# Patient Record
Sex: Male | Born: 1955 | Race: Black or African American | Hispanic: No | Smoking: Never smoker
Health system: Southern US, Community
[De-identification: ages and names within clinical notes are randomized; demographics above are authoritative.]

## PROBLEM LIST (undated history)

## (undated) DIAGNOSIS — E669 Obesity, unspecified: Secondary | ICD-10-CM

## (undated) HISTORY — PX: HERNIA REPAIR: SHX51

---

## 2005-12-17 DIAGNOSIS — Z9889 Other specified postprocedural states: Secondary | ICD-10-CM | POA: Insufficient documentation

## 2015-04-07 DIAGNOSIS — I110 Hypertensive heart disease with heart failure: Secondary | ICD-10-CM | POA: Diagnosis present

## 2021-06-23 ENCOUNTER — Inpatient Hospital Stay (HOSPITAL_BASED_OUTPATIENT_CLINIC_OR_DEPARTMENT_OTHER)
Admission: EM | Admit: 2021-06-23 | Discharge: 2021-07-19 | DRG: 329 | Disposition: A | Discharge status: Skilled Nursing Facility | Payer: Medicare Other | Attending: Internal Medicine | Admitting: Internal Medicine

## 2021-06-23 ENCOUNTER — Emergency Department (HOSPITAL_BASED_OUTPATIENT_CLINIC_OR_DEPARTMENT_OTHER): Payer: Medicare Other

## 2021-06-23 ENCOUNTER — Encounter (HOSPITAL_BASED_OUTPATIENT_CLINIC_OR_DEPARTMENT_OTHER): Payer: Self-pay | Admitting: *Deleted

## 2021-06-23 ENCOUNTER — Other Ambulatory Visit: Payer: Self-pay

## 2021-06-23 DIAGNOSIS — E876 Hypokalemia: Secondary | ICD-10-CM

## 2021-06-23 DIAGNOSIS — Z933 Colostomy status: Secondary | ICD-10-CM

## 2021-06-23 DIAGNOSIS — Z6841 Body Mass Index (BMI) 40.0 and over, adult: Secondary | ICD-10-CM

## 2021-06-23 DIAGNOSIS — U071 COVID-19: Secondary | ICD-10-CM

## 2021-06-23 DIAGNOSIS — R112 Nausea with vomiting, unspecified: Secondary | ICD-10-CM | POA: Diagnosis present

## 2021-06-23 DIAGNOSIS — N170 Acute kidney failure with tubular necrosis: Secondary | ICD-10-CM | POA: Diagnosis present

## 2021-06-23 DIAGNOSIS — Y836 Removal of other organ (partial) (total) as the cause of abnormal reaction of the patient, or of later complication, without mention of misadventure at the time of the procedure: Secondary | ICD-10-CM | POA: Diagnosis not present

## 2021-06-23 DIAGNOSIS — J9601 Acute respiratory failure with hypoxia: Secondary | ICD-10-CM | POA: Diagnosis not present

## 2021-06-23 DIAGNOSIS — I509 Heart failure, unspecified: Secondary | ICD-10-CM | POA: Diagnosis present

## 2021-06-23 DIAGNOSIS — N400 Enlarged prostate without lower urinary tract symptoms: Secondary | ICD-10-CM | POA: Diagnosis present

## 2021-06-23 DIAGNOSIS — K529 Noninfective gastroenteritis and colitis, unspecified: Secondary | ICD-10-CM | POA: Diagnosis present

## 2021-06-23 DIAGNOSIS — I110 Hypertensive heart disease with heart failure: Secondary | ICD-10-CM | POA: Diagnosis present

## 2021-06-23 DIAGNOSIS — E46 Unspecified protein-calorie malnutrition: Secondary | ICD-10-CM | POA: Diagnosis present

## 2021-06-23 DIAGNOSIS — K567 Ileus, unspecified: Secondary | ICD-10-CM | POA: Diagnosis not present

## 2021-06-23 DIAGNOSIS — R571 Hypovolemic shock: Secondary | ICD-10-CM | POA: Diagnosis not present

## 2021-06-23 DIAGNOSIS — K6389 Other specified diseases of intestine: Secondary | ICD-10-CM | POA: Diagnosis present

## 2021-06-23 DIAGNOSIS — K56609 Unspecified intestinal obstruction, unspecified as to partial versus complete obstruction: Secondary | ICD-10-CM | POA: Diagnosis not present

## 2021-06-23 DIAGNOSIS — K5720 Diverticulitis of large intestine with perforation and abscess without bleeding: Principal | ICD-10-CM | POA: Diagnosis present

## 2021-06-23 DIAGNOSIS — T8132XA Disruption of internal operation (surgical) wound, not elsewhere classified, initial encounter: Secondary | ICD-10-CM | POA: Diagnosis not present

## 2021-06-23 DIAGNOSIS — K56601 Complete intestinal obstruction, unspecified as to cause: Secondary | ICD-10-CM

## 2021-06-23 DIAGNOSIS — R739 Hyperglycemia, unspecified: Secondary | ICD-10-CM | POA: Diagnosis present

## 2021-06-23 DIAGNOSIS — Z22322 Carrier or suspected carrier of Methicillin resistant Staphylococcus aureus: Secondary | ICD-10-CM

## 2021-06-23 DIAGNOSIS — R0902 Hypoxemia: Secondary | ICD-10-CM

## 2021-06-23 DIAGNOSIS — Z79899 Other long term (current) drug therapy: Secondary | ICD-10-CM | POA: Diagnosis not present

## 2021-06-23 DIAGNOSIS — E65 Localized adiposity: Secondary | ICD-10-CM

## 2021-06-23 DIAGNOSIS — D62 Acute posthemorrhagic anemia: Secondary | ICD-10-CM | POA: Diagnosis not present

## 2021-06-23 HISTORY — DX: Obesity, unspecified: E66.9

## 2021-06-23 LAB — CBC
HCT: 48.2 % (ref 39.0–52.0)
Hemoglobin: 16.7 g/dL (ref 13.0–17.0)
MCH: 30.4 pg (ref 26.0–34.0)
MCHC: 34.6 g/dL (ref 30.0–36.0)
MCV: 87.6 fL (ref 80.0–100.0)
Platelets: 277 10*3/uL (ref 150–400)
RBC: 5.5 MIL/uL (ref 4.22–5.81)
RDW: 13.6 % (ref 11.5–15.5)
WBC: 12.4 10*3/uL — ABNORMAL HIGH (ref 4.0–10.5)
nRBC: 0 % (ref 0.0–0.2)

## 2021-06-23 LAB — COMPREHENSIVE METABOLIC PANEL
ALT: 10 U/L (ref 0–44)
AST: 18 U/L (ref 15–41)
Albumin: 3.6 g/dL (ref 3.5–5.0)
Alkaline Phosphatase: 86 U/L (ref 38–126)
Anion gap: 8 (ref 5–15)
BUN: 13 mg/dL (ref 8–23)
CO2: 25 mmol/L (ref 22–32)
Calcium: 9 mg/dL (ref 8.9–10.3)
Chloride: 105 mmol/L (ref 98–111)
Creatinine, Ser: 0.94 mg/dL (ref 0.61–1.24)
GFR, Estimated: >60 mL/min (ref >60)
Glucose, Bld: 115 mg/dL — ABNORMAL HIGH (ref 70–99)
Potassium: 3.4 mmol/L — ABNORMAL LOW (ref 3.5–5.1)
Sodium: 138 mmol/L (ref 135–145)
Total Bilirubin: 0.8 mg/dL (ref 0.3–1.2)
Total Protein: 8.9 g/dL — ABNORMAL HIGH (ref 6.5–8.1)

## 2021-06-23 LAB — URINALYSIS, MICROSCOPIC (REFLEX)

## 2021-06-23 LAB — URINALYSIS, ROUTINE W REFLEX MICROSCOPIC
Glucose, UA: NEGATIVE mg/dL
Ketones, ur: NEGATIVE mg/dL
Nitrite: NEGATIVE
Protein, ur: 30 mg/dL — AB
Specific Gravity, Urine: 1.02 (ref 1.005–1.030)
pH: 5.5 (ref 5.0–8.0)

## 2021-06-23 LAB — BRAIN NATRIURETIC PEPTIDE: B Natriuretic Peptide: 25.5 pg/mL (ref 0.0–100.0)

## 2021-06-23 LAB — RESP PANEL BY RT-PCR (FLU A&B, COVID) ARPGX2
Influenza A by PCR: NEGATIVE
Influenza B by PCR: NEGATIVE
SARS Coronavirus 2 by RT PCR: POSITIVE — AB

## 2021-06-23 LAB — LIPASE, BLOOD: Lipase: 22 U/L (ref 11–51)

## 2021-06-23 MED ORDER — ONDANSETRON HCL 4 MG/2ML IJ SOLN: 4.0000 mg | Freq: Once | INTRAMUSCULAR | Status: AC

## 2021-06-23 MED ORDER — ONDANSETRON HCL 4 MG/2ML IJ SOLN
4.0000 mg | Freq: Four times a day (QID) | INTRAMUSCULAR | Status: DC | PRN
  Administered 2021-06-26 – 2021-06-27 (×2): 4 mg via INTRAVENOUS
  Filled 2021-06-23 (×2): qty 2

## 2021-06-23 MED ORDER — POTASSIUM CHLORIDE 20 MEQ PO PACK
20.0000 meq | PACK | Freq: Once | ORAL | Status: AC
  Administered 2021-06-23: 20 meq via ORAL
  Filled 2021-06-23: qty 1

## 2021-06-23 MED ORDER — ACETAMINOPHEN 325 MG PO TABS: 650.0000 mg | ORAL_TABLET | Freq: Four times a day (QID) | ORAL | Status: DC | PRN

## 2021-06-23 MED ORDER — ONDANSETRON HCL 4 MG/2ML IJ SOLN
INTRAMUSCULAR | Status: AC
  Administered 2021-06-23: 4 mg via INTRAVENOUS
  Filled 2021-06-23: qty 2

## 2021-06-23 MED ORDER — MORPHINE SULFATE (PF) 2 MG/ML IV SOLN
2.0000 mg | INTRAVENOUS | Status: DC | PRN
  Administered 2021-06-23 – 2021-06-24 (×4): 2 mg via INTRAVENOUS
  Filled 2021-06-23 (×5): qty 1

## 2021-06-23 MED ORDER — OXYCODONE HCL 5 MG PO TABS: 5.0000 mg | ORAL_TABLET | ORAL | Status: DC | PRN

## 2021-06-23 MED ORDER — MUPIROCIN 2 % EX OINT
1.0000 "application " | TOPICAL_OINTMENT | Freq: Two times a day (BID) | CUTANEOUS | Status: AC
  Administered 2021-06-24 – 2021-06-28 (×10): 1 via NASAL
  Filled 2021-06-23 (×4): qty 22

## 2021-06-23 MED ORDER — MORPHINE SULFATE (PF) 4 MG/ML IV SOLN
4.0000 mg | Freq: Once | INTRAVENOUS | Status: AC
Start: 2021-06-23 — End: 2021-06-23
  Administered 2021-06-23: 4 mg via INTRAVENOUS
  Filled 2021-06-23: qty 1

## 2021-06-23 MED ORDER — ACETAMINOPHEN 650 MG RE SUPP: 650.0000 mg | Freq: Four times a day (QID) | RECTAL | Status: DC | PRN

## 2021-06-23 MED ORDER — ENOXAPARIN SODIUM 40 MG/0.4ML IJ SOSY: 40.0000 mg | PREFILLED_SYRINGE | INTRAMUSCULAR | Status: DC

## 2021-06-23 MED ORDER — IOHEXOL 300 MG/ML  SOLN
100.0000 mL | Freq: Once | INTRAMUSCULAR | Status: AC | PRN
  Administered 2021-06-23: 100 mL via INTRAVENOUS

## 2021-06-23 MED ORDER — ONDANSETRON HCL 4 MG PO TABS
4.0000 mg | ORAL_TABLET | Freq: Four times a day (QID) | ORAL | Status: DC | PRN
Start: 2021-06-23 — End: 2021-07-20

## 2021-06-23 MED ORDER — LACTATED RINGERS IV SOLN: INTRAVENOUS | Status: DC

## 2021-06-23 MED ORDER — ALBUTEROL SULFATE (2.5 MG/3ML) 0.083% IN NEBU: 2.5000 mg | INHALATION_SOLUTION | Freq: Four times a day (QID) | RESPIRATORY_TRACT | Status: DC | PRN

## 2021-06-23 NOTE — ED Notes (Signed)
Report called to [facility], CN at [facility]

## 2021-06-23 NOTE — ED Triage Notes (Signed)
Abdominal pain. Abdominal bloating. Constipation. Hx of diverticulitis.

## 2021-06-23 NOTE — ED Provider Notes (Addendum)
[facility] EMERGENCY DEPARTMENT Provider Note   CSN: [id number] Arrival date & time: 06/23/21  1045     History Chief Complaint  Patient presents with   Abdominal Pain    Travis Calderon is a 65 y.o. male with a past medical history significant for diverticulosis with a history of diverticulitis who presents to the ED due to generalized abdominal pain x3 days associated with abdominal distention and nausea.  Patient admits to mild nausea, but denies vomiting and diarrhea.  He has had a previous hernia repair however, no other abdominal operations.  Admits to intermittent chills, but denies fever.  Denies chest pain and shortness of breath.  No treatment prior to arrival.  No aggravating or alleviating symptoms.  History obtained from patient and past medical records. No interpreter used during encounter.       Past Medical History:  Diagnosis Date   Obesity     There are no problems to display for this patient.   Past Surgical History:  Procedure Laterality Date   HERNIA REPAIR         No family history on file.  Social History   Tobacco Use   Smoking status: Never   Smokeless tobacco: Never  Vaping Use   Vaping Use: Never used  Substance Use Topics   Alcohol use: Yes   Drug use: Never    Home Medications Prior to Admission medications   Not on File    Allergies    Patient has no known allergies.  Review of Systems   Review of Systems  Constitutional:  Positive for chills. Negative for fever.  Respiratory:  Negative for shortness of breath.   Cardiovascular:  Negative for chest pain.  Gastrointestinal:  Positive for abdominal distention, abdominal pain and nausea. Negative for diarrhea and vomiting.  Genitourinary:  Negative for dysuria.  All other systems reviewed and are negative.  Physical Exam Updated Vital Signs BP 125/80   Pulse 91   Temp 98.5 F (36.9 C) (Oral)   Resp 14   Ht 5\' 7"  (1.702 m)   Wt (!) 145.2 kg   SpO2 95%    BMI 50.15 kg/m   Physical Exam Vitals and nursing note reviewed.  Constitutional:      General: He is not in acute distress.    Appearance: He is not ill-appearing.  HENT:     Head: Normocephalic.  Eyes:     Pupils: Pupils are equal, round, and reactive to light.  Cardiovascular:     Rate and Rhythm: Normal rate and regular rhythm.     Pulses: Normal pulses.     Heart sounds: Normal heart sounds. No murmur heard.   No friction rub. No gallop.  Pulmonary:     Effort: Pulmonary effort is normal.     Breath sounds: Normal breath sounds.  Abdominal:     General: Abdomen is flat. There is distension.     Palpations: Abdomen is soft.     Tenderness: There is abdominal tenderness. There is guarding. There is no rebound.     Comments: Diffuse abdominal tenderness with voluntary guarding.  Musculoskeletal:        General: Normal range of motion.     Cervical back: Neck supple.     Comments: 1+ pitting edema bilaterally  Skin:    General: Skin is warm and dry.  Neurological:     General: No focal deficit present.     Mental Status: He is alert.  Psychiatric:  Mood and Affect: Mood normal.        Behavior: Behavior normal.    ED Results / Procedures / Treatments   Labs (all labs ordered are listed, but only abnormal results are displayed) Labs Reviewed  COMPREHENSIVE METABOLIC PANEL - Abnormal; Notable for the following components:      Result Value   Potassium 3.4 (*)    Glucose, Bld 115 (*)    Total Protein 8.9 (*)    All other components within normal limits  CBC - Abnormal; Notable for the following components:   WBC 12.4 (*)    All other components within normal limits  URINALYSIS, ROUTINE W REFLEX MICROSCOPIC - Abnormal; Notable for the following components:   Color, Urine AMBER (*)    Hgb urine dipstick LARGE (*)    Bilirubin Urine MODERATE (*)    Protein, ur 30 (*)    Leukocytes,Ua TRACE (*)    All other components within normal limits  URINALYSIS,  MICROSCOPIC (REFLEX) - Abnormal; Notable for the following components:   Bacteria, UA FEW (*)    All other components within normal limits  RESP PANEL BY RT-PCR (FLU A&B, COVID) ARPGX2  LIPASE, BLOOD  BRAIN NATRIURETIC PEPTIDE    EKG None  Radiology CT ABDOMEN PELVIS W CONTRAST  Result Date: 06/23/2021 CLINICAL DATA:  Abdominal distension, constipation EXAM: CT ABDOMEN AND PELVIS WITH CONTRAST TECHNIQUE: Multidetector CT imaging of the abdomen and pelvis was performed using the standard protocol following bolus administration of intravenous contrast. CONTRAST:  OMNIPAQUE IOHEXOL 300 MG/ML  SOLN COMPARISON:  None. FINDINGS: Lower chest: The included lung bases are clear. Heart size within normal limits. Hepatobiliary: No focal liver abnormality is seen. No gallstones, gallbladder wall thickening, or biliary dilatation. Pancreas: Unremarkable. No pancreatic ductal dilatation or surrounding inflammatory changes. Spleen: Normal in size without focal abnormality. Adrenals/Urinary Tract: Unremarkable adrenal glands. No adrenal nodules or masses. Simple 1.6 cm lower pole right renal cyst. Simple 1.3 cm cyst within the interpolar region of the left kidney. No renal stone or hydronephrosis. Urinary bladder is incompletely distended, limiting its evaluation. Stomach/Bowel: Large mass within the proximal sigmoid colon with approximate measurements of 8.6 x 3.8 x 3.6 cm (series 2, images 65-77). Multiple small adjacent pericolonic lymph nodes concerning for metastatic disease. Mass completely occupies the colonic lumen at this location. There is associated obstruction of large and small bowel upstream from this level. Fecalization of small bowel content. No pneumatosis is seen. An air-filled appendix is present. Stomach is mildly distended but otherwise within normal limits. Vascular/Lymphatic: No vascular abnormality is identified. Scattered nonenlarged retroperitoneal lymph nodes are nonspecific.  Reproductive: Mildly enlarged prostate gland. Other: No free fluid. No abdominopelvic fluid collection. No pneumoperitoneum. No abdominal wall hernia. Musculoskeletal: No acute osseous abnormality. No suspicious bone lesion. Severe osteoarthritis of the right hip. IMPRESSION: 1. Large obstructing colonic mass within the proximal sigmoid colon measuring up to 8.6 cm. There is associated mechanical obstruction of the large and small bowel upstream from this mass. Surgical evaluation is recommended. 2. Multiple mildly prominent pericolonic lymph nodes concerning for metastatic disease. Scattered nonenlarged retroperitoneal lymph nodes are nonspecific. 3. Mildly enlarged prostate gland. These results were called by telephone at the time of interpretation on 06/23/2021 at 1:04 pm to provider [facility], [facility] , who verbally acknowledged these results. Electronically Signed   By: Duanne Guess D.O.   On: 06/23/2021 13:09   DG Chest Portable 1 View  Result Date: 06/23/2021 CLINICAL DATA:  Edema. EXAM: PORTABLE  CHEST 1 VIEW COMPARISON:  April 07, 2016. FINDINGS: Stable cardiomegaly. Both lungs are clear. The visualized skeletal structures are unremarkable. IMPRESSION: No active disease. Electronically Signed   By: Lupita Raider M.D.   On: 06/23/2021 12:55    Procedures Procedures   Medications Ordered in ED Medications  morphine 4 MG/ML injection 4 mg (4 mg Intravenous Given 06/23/21 1151)  ondansetron (ZOFRAN) injection 4 mg (4 mg Intravenous Given 06/23/21 1150)  iohexol (OMNIPAQUE) 300 MG/ML solution 100 mL (100 mLs Intravenous Contrast Given 06/23/21 1221)    ED Course  I have reviewed the triage vital signs and the nursing notes.  Pertinent labs & imaging results that were available during my care of the patient were reviewed by me and considered in my medical decision making (see chart for details).  Clinical Course as of 06/23/21 1524  Fri Jun 23, 2021  1139 WBC(!): 12.4 [CA]  1233 WBC(!): 12.4  [CA]  1234 Hgb urine dipstick(!): LARGE [CA]  1234 Bilirubin Urine(!): MODERATE [CA]  1234 Leukocytes,Ua(!): TRACE [CA]  1234 Bacteria, UA(!): FEW [CA]  1234 Potassium(!): 3.4 [CA]  1234 Glucose(!): 115 [CA]  1357 This is a 65 year old male with a history of obesity presenting to ED with abdominal pain, nausea and vomiting.  His vital signs were stable.  On CT he was found to have a large obstructing bowel mass with subsequent high-grade large and small intestine bowel obstruction.  No evidence of perforation.  The case was discussed with the general surgeon at [facility] who advised transfer of patient to [facility] ED for evaluation and determination of disposition plan.  The patient and his stepdaughter by phone are both notified of this plan and in agreement.  On reassessment he appears calm, does not have persistent nausea or vomiting.  He has very minimal abdominal pain. [MT]  1404 SARS Coronavirus 2 by RT PCR(!): POSITIVE [CA]    Clinical Course User Index [CA] Travis Stabile, Travis Calderon [MT] Terald Sleeper, MD   MDM Rules/Calculators/A&P                         65 year old male presents to the ED due to generalized abdominal pain associated with distention and nausea.  History of diverticulitis.  No fever.  He has had a previous hernia repair.  Upon arrival, stable vitals.  Patient noted to be tachycardic at triage at 105 however, during my initial evaluation, patient's heart rate in the 90s.  Patient nontoxic-appearing.  Physical exam significant for mild abdominal distention with diffuse tenderness.  1+ pitting edema bilaterally.  Abdominal labs ordered at triage.  Added BNP given bilateral 1+ edema to rule out CHF. CT abdomen to rule out diverticulitis and other emergent intra-abdominal etiologies. Zofran and morphine given.   CBC significant for mild leukocytosis at 12.4.  CMP significant for hypokalemia 3.4, hyperglycemia 115.  No anion gap.  Low suspicion for DKA.  Normal renal function.  Normal  LFTs.  UA significant for large hematuria, moderate bilirubinuria, and trace leukocytes. No urinary symptoms. Urine culture pending. CT abdomen personally reviewed which demonstrates: IMPRESSION:  1. Large obstructing colonic mass within the proximal sigmoid colon  measuring up to 8.6 cm. There is associated mechanical obstruction  of the large and small bowel upstream from this mass. Surgical  evaluation is recommended.  2. Multiple mildly prominent pericolonic lymph nodes concerning for  metastatic disease. Scattered nonenlarged retroperitoneal lymph  nodes are nonspecific.  3. Mildly enlarged prostate gland.  CXR personally reviewed which is negative for acute abnormalities.  1:17 PM Discussed case with Dr. Gerrit Friends with general surgery who recommends ED to ED transfer to [facility]. Dr. Gerrit Friends will evaluate patient and decide if surgery vs. Hospitalist admission.   Hyman Hopes, Travis Calderon (Dr. Dalene Seltzer, attending) accepted patient for transfer to [facility] ED. Upon arrival to ED, re-consult Dr. Gerrit Friends who will evaluate patient.   Discussed case with Dr. Renaye Rakers who evaluated patient at bedside and agrees with assessment and plan.   3:17 PM Patient found to be COVID positive.  Travis Calderon was evaluated in Emergency Department on 06/23/2021 for the symptoms described in the history of present illness. He was evaluated in the context of the global COVID-19 pandemic, which necessitated consideration that the patient might be at risk for infection with the SARS-CoV-2 virus that causes COVID-19. Institutional protocols and algorithms that pertain to the evaluation of patients at risk for COVID-19 are in a state of rapid change based on information released by regulatory bodies including the CDC and federal and state organizations. These policies and algorithms were followed during the patient's care in the ED.  Final Clinical Impression(s) / ED Diagnoses Final diagnoses:  Colonic mass    Rx / DC Orders ED  Discharge Orders     None        Travis Stabile, Travis Calderon 06/23/21 1331    Travis Calderon, [place] 06/23/21 1524    Terald Sleeper, MD 06/23/21 1724

## 2021-06-23 NOTE — ED Notes (Signed)
Report given to [facility] with [facility]

## 2021-06-23 NOTE — ED Notes (Signed)
Hospitalist at bedside 

## 2021-06-23 NOTE — ED Provider Notes (Signed)
Patient transferred from [facility] for general surgery evaluation.  See note for full HPI.  In summation 65 year old who originally presented for abdominal pain, nausea and vomiting.  CT scan was found to have large obstructing bowel mass with subsequent high-grade large bowel and small intestinal obstruction.  He was incidentally found to be COVID-positive.  Patient was transferred here for general surgery assessment.  Patient denies any current complaints.  Last bowel movement was yesterday.  No emesis during transport.  He is not passing flatus. Physical Exam  BP 120/73   Pulse 98   Temp 98.5 F (36.9 C) (Oral)   Resp 12   Ht 5\' 7"  (1.702 m)   Wt (!) 145.2 kg   SpO2 96%   BMI 50.15 kg/m   Physical Exam Vitals and nursing note reviewed.  Constitutional:      General: He is not in acute distress.    Appearance: He is well-developed. He is not ill-appearing, toxic-appearing or diaphoretic.  HENT:     Head: Atraumatic.  Eyes:     Pupils: Pupils are equal, round, and reactive to light.  Cardiovascular:     Rate and Rhythm: Normal rate and regular rhythm.  Pulmonary:     Effort: Pulmonary effort is normal. No respiratory distress.  Abdominal:     General: Bowel sounds are decreased. There is distension.     Palpations: Abdomen is soft.     Tenderness: There is generalized abdominal tenderness.  Musculoskeletal:        General: Normal range of motion.     Cervical back: Normal range of motion and neck supple.  Skin:    General: Skin is warm and dry.  Neurological:     General: No focal deficit present.     Mental Status: He is alert and oriented to person, place, and time.    ED Course/Procedures   Clinical Course as of 06/23/21 1934  Fri Jun 23, 2021  1139 WBC(!): 12.4 [CA]  1233 WBC(!): 12.4 [CA]  1234 Hgb urine dipstick(!): LARGE [CA]  1234 Bilirubin Urine(!): MODERATE [CA]  1234 Leukocytes,Ua(!): TRACE [CA]  1234 Bacteria, UA(!): FEW [CA]  1234  Potassium(!): 3.4 [CA]  1234 Glucose(!): 115 [CA]  1357 This is a 65 year old male with a history of obesity presenting to ED with abdominal pain, nausea and vomiting.  His vital signs were stable.  On CT he was found to have a large obstructing bowel mass with subsequent high-grade large and small intestine bowel obstruction.  No evidence of perforation.  The case was discussed with the general surgeon at [facility] who advised transfer of patient to [facility] ED for evaluation and determination of disposition plan.  The patient and his stepdaughter by phone are both notified of this plan and in agreement.  On reassessment he appears calm, does not have persistent nausea or vomiting.  He has very minimal abdominal pain. [MT]  1404 SARS Coronavirus 2 by RT PCR(!): POSITIVE [CA]    Clinical Course User Index [CA] [facility], PA-C [MT] Mannie Stabile, MD    Procedures Labs Reviewed  RESP PANEL BY RT-PCR (FLU A&B, COVID) ARPGX2 - Abnormal; Notable for the following components:      Result Value   SARS Coronavirus 2 by RT PCR POSITIVE (*)    All other components within normal limits  COMPREHENSIVE METABOLIC PANEL - Abnormal; Notable for the following components:   Potassium 3.4 (*)    Glucose, Bld 115 (*)  Total Protein 8.9 (*)    All other components within normal limits  CBC - Abnormal; Notable for the following components:   WBC 12.4 (*)    All other components within normal limits  URINALYSIS, ROUTINE W REFLEX MICROSCOPIC - Abnormal; Notable for the following components:   Color, Urine AMBER (*)    Hgb urine dipstick LARGE (*)    Bilirubin Urine MODERATE (*)    Protein, ur 30 (*)    Leukocytes,Ua TRACE (*)    All other components within normal limits  URINALYSIS, MICROSCOPIC (REFLEX) - Abnormal; Notable for the following components:   Bacteria, UA FEW (*)    All other components within normal limits  LIPASE, BLOOD  BRAIN NATRIURETIC PEPTIDE  HIV ANTIBODY (ROUTINE TESTING W REFLEX)   COMPREHENSIVE METABOLIC PANEL  CBC  PROTIME-INR  APTT  MAGNESIUM  PHOSPHORUS   CT ABDOMEN PELVIS W CONTRAST  Result Date: 06/23/2021 CLINICAL DATA:  Abdominal distension, constipation EXAM: CT ABDOMEN AND PELVIS WITH CONTRAST TECHNIQUE: Multidetector CT imaging of the abdomen and pelvis was performed using the standard protocol following bolus administration of intravenous contrast. CONTRAST:  OMNIPAQUE IOHEXOL 300 MG/ML  SOLN COMPARISON:  None. FINDINGS: Lower chest: The included lung bases are clear. Heart size within normal limits. Hepatobiliary: No focal liver abnormality is seen. No gallstones, gallbladder wall thickening, or biliary dilatation. Pancreas: Unremarkable. No pancreatic ductal dilatation or surrounding inflammatory changes. Spleen: Normal in size without focal abnormality. Adrenals/Urinary Tract: Unremarkable adrenal glands. No adrenal nodules or masses. Simple 1.6 cm lower pole right renal cyst. Simple 1.3 cm cyst within the interpolar region of the left kidney. No renal stone or hydronephrosis. Urinary bladder is incompletely distended, limiting its evaluation. Stomach/Bowel: Large mass within the proximal sigmoid colon with approximate measurements of 8.6 x 3.8 x 3.6 cm (series 2, images 65-77). Multiple small adjacent pericolonic lymph nodes concerning for metastatic disease. Mass completely occupies the colonic lumen at this location. There is associated obstruction of large and small bowel upstream from this level. Fecalization of small bowel content. No pneumatosis is seen. An air-filled appendix is present. Stomach is mildly distended but otherwise within normal limits. Vascular/Lymphatic: No vascular abnormality is identified. Scattered nonenlarged retroperitoneal lymph nodes are nonspecific. Reproductive: Mildly enlarged prostate gland. Other: No free fluid. No abdominopelvic fluid collection. No pneumoperitoneum. No abdominal wall hernia. Musculoskeletal: No acute osseous  abnormality. No suspicious bone lesion. Severe osteoarthritis of the right hip. IMPRESSION: 1. Large obstructing colonic mass within the proximal sigmoid colon measuring up to 8.6 cm. There is associated mechanical obstruction of the large and small bowel upstream from this mass. Surgical evaluation is recommended. 2. Multiple mildly prominent pericolonic lymph nodes concerning for metastatic disease. Scattered nonenlarged retroperitoneal lymph nodes are nonspecific. 3. Mildly enlarged prostate gland. These results were called by telephone at the time of interpretation on 06/23/2021 at 1:04 pm to provider [facility], [facility] , who verbally acknowledged these results. Electronically Signed   By: Duanne Guess D.O.   On: 06/23/2021 13:09   DG Chest Portable 1 View  Result Date: 06/23/2021 CLINICAL DATA:  Edema. EXAM: PORTABLE CHEST 1 VIEW COMPARISON:  April 07, 2016. FINDINGS: Stable cardiomegaly. Both lungs are clear. The visualized skeletal structures are unremarkable. IMPRESSION: No active disease. Electronically Signed   By: Lupita Raider M.D.   On: 06/23/2021 12:55    MDM  Plan to consult general surgery for recommendations.  He does have distended abdomen however currently nontender.  He denies any additional  emesis since transfer.  Not passing flatus.  With regards to his COVID status he denies any shortness of breath.  Initially had some tachycardia however he is afebrile without tachypnea or hypoxia.  CONSULT with General surgery Dr. Freida Busman who recommends medicine admit. Surgery will follow along. Request NG tube.   CONSULT with Dr. Marikay Alar with [facility] who will evaluate patient for admission.  The patient appears reasonably stabilized for admission considering the current resources, flow, and capabilities available in the ED at this time, and I doubt any other [facility] requiring further screening and/or treatment in the ED prior to admission.       Geoff Dacanay A, PA-C 06/23/21 1935     Rozelle Logan, [place] 06/24/21 0122

## 2021-06-23 NOTE — ED Notes (Signed)
Patient's sister Mrs Henreitta Leber [phone] update with plan of care ASAP

## 2021-06-23 NOTE — ED Notes (Signed)
Patient transported to CT 

## 2021-06-23 NOTE — ED Notes (Addendum)
Attempted to insert NG tube, pt very uncomfortable despite IV morphine given prior to attempting to insert.  Pt refusing NG tube at this time.

## 2021-06-23 NOTE — Consult Note (Signed)
Travis Calderon 01-Nov-1956  [id number].    Chief Complaint/Reason for Consult: Large bowel obstruction, possible colonic mass  HPI:  Travis Calderon is a 65 yo male who presented to [facility] today with progressive abdominal distension and constipation. He says he has had intermittent abdominal discomfort and constipation for the last few weeks, but this week his symptoms have gotten worse. He has recently been having diarrhea with some blood in his stools, but 2 days ago he stopped having bowel movements. He is not passing flatus. He also endorses abdominal pain. He denies unintentional weight loss. In the ED he has been afebrile and hemodynamically stable. WBC is mildly elevated at 12 but electrolytes are normal. He tested positive for COVID but is asymptomatic. A CT scan showed obstruction of the sigmoid colon with concern for a mass. General surgery was consulted.  He says he has previously had diverticulitis, most recently in 2019 when he lived in [place]. He reports that he had a colonoscopy after that episode that was normal. He has no known family history of colon cancer. He denies any other medical conditions. He reports he has previously had a hernia repair in the 1980s.  ROS: Review of Systems  Constitutional:  Negative for chills, fever and weight loss.  Respiratory:  Negative for shortness of breath and wheezing.   Gastrointestinal:  Positive for abdominal pain, blood in stool, constipation and nausea.   Family History  Problem Relation Age of Onset   Colon cancer Neg Hx     Past Medical History:  Diagnosis Date   Obesity     Past Surgical History:  Procedure Laterality Date   HERNIA REPAIR      Social History:  reports that he has never smoked. He has never used smokeless tobacco. He reports current alcohol use. He reports that he does not use drugs.  Allergies: No Known Allergies  (Not in a hospital admission)    Physical Exam: Blood pressure 132/85,  pulse 99, temperature 98.5 F (36.9 C), temperature source Oral, resp. rate 18, height 5\' 7"  (1.702 m), weight (!) 145.2 kg, SpO2 94 %. General: resting comfortably, appears stated age, no apparent distress Neurological: alert and oriented, no focal deficits, cranial nerves grossly in tact HEENT: normocephalic, atraumatic, oropharynx clear, no scleral icterus CV: regular rate and rhythm, extremities warm and well-perfused Respiratory: normal work of breathing, symmetric chest wall expansion Abdomen: distended but compressible, mildly tender. Well-healed midline surgical scar. Extremities: warm and well-perfused, no deformities, moving all extremities spontaneously Psychiatric: normal mood and affect Skin: warm and dry, no jaundice, no rashes or lesions   Results for orders placed or performed during the hospital encounter of 06/23/21 (from the past 48 hour(s))  Lipase, blood     Status: None   Collection Time: 06/23/21 11:09 AM  Result Value Ref Range   Lipase 22 11 - 51 U/L    Comment: Performed at [facility], [address] [place], [place]  Comprehensive metabolic panel     Status: Abnormal   Collection Time: 06/23/21 11:09 AM  Result Value Ref Range   Sodium 138 135 - 145 mmol/L   Potassium 3.4 (L) 3.5 - 5.1 mmol/L   Chloride 105 98 - 111 mmol/L   CO2 25 22 - 32 mmol/L   Glucose, Bld 115 (H) 70 - 99 mg/dL    Comment: Glucose reference range applies only to samples taken after fasting for at least 8 hours.  BUN 13 8 - 23 mg/dL   Creatinine, Ser 1.610.94 0.61 - 1.24 mg/dL   Calcium 9.0 8.9 - 09.610.3 mg/dL   Total Protein 8.9 (H) 6.5 - 8.1 g/dL   Albumin 3.6 3.5 - 5.0 g/dL   AST 18 15 - 41 U/L   ALT 10 0 - 44 U/L   Alkaline Phosphatase 86 38 - 126 U/L   Total Bilirubin 0.8 0.3 - 1.2 mg/dL   GFR, Estimated >04>60 >54>60 mL/min    Comment: (NOTE) Calculated using the CKD-EPI Creatinine Equation (2021)    Anion gap 8 5 - 15    Comment: Performed at [facility], [address] [place], [place] [address]  CBC     Status: Abnormal   Collection Time: 06/23/21 11:09 AM  Result Value Ref Range   WBC 12.4 (H) 4.0 - 10.5 K/uL   RBC 5.50 4.22 - 5.81 MIL/uL   Hemoglobin 16.7 13.0 - 17.0 g/dL   HCT 91.448.2 78.239.0 - 95.652.0 %   MCV 87.6 80.0 - 100.0 fL   MCH 30.4 26.0 - 34.0 pg   MCHC 34.6 30.0 - 36.0 g/dL   RDW 21.313.6 08.611.5 - 57.815.5 %   Platelets 277 150 - 400 K/uL   nRBC 0.0 0.0 - 0.2 %    Comment: Performed at [facility] [place], [address] [place], [place] [address]  Urinalysis, Routine w reflex microscopic Urine, Clean Catch     Status: Abnormal   Collection Time: 06/23/21 11:09 AM  Result Value Ref Range   Color, Urine AMBER (A) YELLOW    Comment: BIOCHEMICALS MAY BE AFFECTED BY COLOR   APPearance CLEAR CLEAR   Specific Gravity, Urine 1.020 1.005 - 1.030   pH 5.5 5.0 - 8.0   Glucose, UA NEGATIVE NEGATIVE mg/dL   Hgb urine dipstick LARGE (A) NEGATIVE   Bilirubin Urine MODERATE (A) NEGATIVE   Ketones, ur NEGATIVE NEGATIVE mg/dL   Protein, ur 30 (A) NEGATIVE mg/dL   Nitrite NEGATIVE NEGATIVE   Leukocytes,Ua TRACE (A) NEGATIVE    Comment: Performed at [facility] [place], [address] [place], [place] [address]  Brain natriuretic peptide     Status: None   Collection Time: 06/23/21 11:09 AM  Result Value Ref Range   B Natriuretic Peptide 25.5 0.0 - 100.0 pg/mL    Comment: Performed at [facility] [place], [address] [place], [place] [address]  Urinalysis, Microscopic (reflex)     Status: Abnormal   Collection Time: 06/23/21 11:09 AM  Result Value Ref Range   RBC / HPF 21-50 0 - 5 RBC/hpf   WBC, UA 0-5 0 - 5 WBC/hpf   Bacteria, UA FEW (A) NONE SEEN   Squamous Epithelial / LPF 0-5 0 - 5   Hyaline Casts, UA PRESENT    Granular Casts, UA PRESENT     Comment: Performed at [facility] [place], [address] [place], [place] [address]  Resp Panel by RT-PCR (Flu A&B, Covid) Nasopharyngeal Swab     Status: Abnormal    Collection Time: 06/23/21  1:12 PM   Specimen: Nasopharyngeal Swab; Nasopharyngeal(NP) swabs in vial transport medium  Result Value Ref Range   SARS Coronavirus 2 by RT PCR POSITIVE (A) NEGATIVE    Comment: RESULT CALLED TO, READ BACK BY AND VERIFIED WITH:  COBLE,S RN @1403  06/23/21 EDENSCA (NOTE) SARS-CoV-2 target nucleic acids are DETECTED.  The SARS-CoV-2 RNA is generally detectable in upper  respiratory specimens during the acute phase of infection. Positive results are indicative of the presence of the identified virus, but do not rule out bacterial infection or co-infection with other pathogens not detected by the test. Clinical correlation with patient history and other diagnostic information is necessary to determine patient infection status. The expected result is Negative.  Fact Sheet for Patients: BloggerCourse.com  Fact Sheet for Healthcare Providers: SeriousBroker.it  This test is not yet approved or cleared by the [place] FDA and  has been authorized for detection and/or diagnosis of SARS-CoV-2 by FDA under an Emergency Use Authorization (EUA).  This EUA will remain in effect (meaning this test can be  used) for the duration of  the COVID-19 declaration under Section 564(b)(1) of the Act, 21 U.S.C. section 360bbb-3(b)(1), unless the authorization is terminated or revoked sooner.     Influenza A by PCR NEGATIVE NEGATIVE   Influenza B by PCR NEGATIVE NEGATIVE    Comment: (NOTE) The Xpert Xpress SARS-CoV-2/FLU/RSV plus assay is intended as an aid in the diagnosis of influenza from Nasopharyngeal swab specimens and should not be used as a sole basis for treatment. Nasal washings and aspirates are unacceptable for Xpert Xpress SARS-CoV-2/FLU/RSV testing.  Fact Sheet for Patients: BloggerCourse.com  Fact Sheet for Healthcare Providers: SeriousBroker.it  This test  is not yet approved or cleared by the [place] FDA and has been authorized for detection and/or diagnosis of SARS-CoV-2 by FDA under an Emergency Use Authorization (EUA). This EUA will remain in effect (meaning this test can be used) for the duration of the COVID-19 declaration under Section 564(b)(1) of the Act, 21 U.S.C. section 360bbb-3(b)(1), unless the authorization is terminated or revoked.  Performed at [facility], [address] [place], [place] [address]    CT ABDOMEN PELVIS W CONTRAST  Result Date: 06/23/2021 CLINICAL DATA:  Abdominal distension, constipation EXAM: CT ABDOMEN AND PELVIS WITH CONTRAST TECHNIQUE: Multidetector CT imaging of the abdomen and pelvis was performed using the standard protocol following bolus administration of intravenous contrast. CONTRAST:  OMNIPAQUE IOHEXOL 300 MG/ML  SOLN COMPARISON:  None. FINDINGS: Lower chest: The included lung bases are clear. Heart size within normal limits. Hepatobiliary: No focal liver abnormality is seen. No gallstones, gallbladder wall thickening, or biliary dilatation. Pancreas: Unremarkable. No pancreatic ductal dilatation or surrounding inflammatory changes. Spleen: Normal in size without focal abnormality. Adrenals/Urinary Tract: Unremarkable adrenal glands. No adrenal nodules or masses. Simple 1.6 cm lower pole right renal cyst. Simple 1.3 cm cyst within the interpolar region of the left kidney. No renal stone or hydronephrosis. Urinary bladder is incompletely distended, limiting its evaluation. Stomach/Bowel: Large mass within the proximal sigmoid colon with approximate measurements of 8.6 x 3.8 x 3.6 cm (series 2, images 65-77). Multiple small adjacent pericolonic lymph nodes concerning for metastatic disease. Mass completely occupies the colonic lumen at this location. There is associated obstruction of large and small bowel upstream from this level. Fecalization of small bowel content. No pneumatosis is  seen. An air-filled appendix is present. Stomach is mildly distended but otherwise within normal limits. Vascular/Lymphatic: No vascular abnormality is identified. Scattered nonenlarged retroperitoneal lymph nodes are nonspecific. Reproductive: Mildly enlarged prostate gland. Other: No free fluid. No abdominopelvic fluid collection. No pneumoperitoneum. No abdominal wall hernia. Musculoskeletal: No acute osseous abnormality. No suspicious bone lesion. Severe osteoarthritis of the right hip. IMPRESSION: 1. Large obstructing colonic mass within the proximal sigmoid colon measuring up to 8.6 cm. There is associated mechanical obstruction of the  large and small bowel upstream from this mass. Surgical evaluation is recommended. 2. Multiple mildly prominent pericolonic lymph nodes concerning for metastatic disease. Scattered nonenlarged retroperitoneal lymph nodes are nonspecific. 3. Mildly enlarged prostate gland. These results were called by telephone at the time of interpretation on 06/23/2021 at 1:04 pm to provider [facility] , who verbally acknowledged these results. Electronically Signed   By: Duanne Guess D.O.   On: 06/23/2021 13:09   DG Chest Portable 1 View  Result Date: 06/23/2021 CLINICAL DATA:  Edema. EXAM: PORTABLE CHEST 1 VIEW COMPARISON:  April 07, 2016. FINDINGS: Stable cardiomegaly. Both lungs are clear. The visualized skeletal structures are unremarkable. IMPRESSION: No active disease. Electronically Signed   By: Lupita Raider M.D.   On: 06/23/2021 12:55      Assessment/Plan 65 yo male presenting with obstipation. I reviewed his CT scan. There is focal thickening of the sigmoid colon with an apparent mass, which appears to completely obstruct the lumen. The proximal colon is dilated, with mild small bowel dilation as well. There are no signs of metastatic disease within the abdomen. This appears to be a colonic mass, but another possibility is severe sigmoid diverticulitis. There is  no evidence of perforation. Regardless, surgical intervention is necessary as the patient has a large bowel obstruction. The mass appears to be resectable without evidence of metastatic disease, so I recommended a sigmoid colectomy with end colostomy. I discussed the details of this with the patient, and via phone his sister and daughter. Unfortunately the patient is COVID-positive, but given the acuity of his condition we will need to proceed with surgery. - Keep strict NPO - IV fluid hydration - Place large-bore NG tube (16 or 18 Fr) to low intermittent suction - Plan for surgery tomorrow morning  Sophronia Simas, MD [facility] General, Hepatobiliary and Pancreatic Surgery 06/23/21 8:25 PM

## 2021-06-23 NOTE — ED Notes (Signed)
Attempted to call sister, Mrs Henreitta Leber, left vm to call ED back if she'd like an update or to speak w her brother

## 2021-06-23 NOTE — H&P (Signed)
History and Physical  Patient Name: Travis Calderon     TJQ:[id number]    DOB: 1956/06/29    DOA: 06/23/2021 PCP: [facility]  Patient coming from: Home  Chief Complaint: Abdominal pain and abnormal bowel movements    HPI: Travis Calderon is a 65 y.o. male, with PMH of obesity, diverticulosis who presented to the ER on 06/23/2021 with abdominal pain and abnormal bowel movements.  Patient states for the past few days he has had generalized abdominal pain with abdominal distention.  He has had associated nausea but no vomiting.  He says his p.o. intake has been fine.  He says over the past month he has had abnormal bowel movements, he relates his BM have not been as frequent and appearance notthe same.  He says he sees a primary care doctor yearly, but does not take any medication routinely.  He says he had [place], [place]  He denies any dyspnea, chest pain, or dysuria.  Nothing seems to improve his pain.  Denies similar symptoms in the past.  Denies any family history of cancer.    ED course: -Vitals on admission: Afebrile, heart rate 105, respiratory rate 20, blood pressure 126/82, maintaining sats on room air -Labs on initial presentation: Sodium 138, potassium 3.4, chloride 105, bicarb 25, glucose 115, BUN 13, creatinine 0.94, total protein 8.5, albumin 3.6, WBC 12.4, hemoglobin 16.7, COVID-positive -Imaging obtained on admission: CT abdomen pelvis with contrast showed large obstructing colonic mass.  Chest x-ray unremarkable. -In the ED the patient was given morphine, Zofran.general surgery was contacted and recommended transfer to [facility] for evaluation, and the hospitalist service was contacted for further evaluation and management.     ROS: A complete and thorough 12 point review of systems obtained, negative listed in HPI.     Past Medical History:  Diagnosis Date   Obesity     Past Surgical History:  Procedure Laterality Date    HERNIA REPAIR      Social History: Patient lives at home.  The patient walks without assistance.  Non smoker.  No Known Allergies  Family history: family history is not on file.  Prior to Admission medications   Not on File       Physical Exam: BP 120/73   Pulse 98   Temp 98.5 F (36.9 C) (Oral)   Resp 12   Ht 5\' 7"  (1.702 m)   Wt (!) 145.2 kg   SpO2 96%   BMI 50.15 kg/m   General appearance: Well-developed, adult male, alert and in no acute distress .   Eyes: Anicteric, conjunctiva pink, lids and lashes normal. PERRL.    ENT: No nasal deformity, discharge, epistaxis.  Hearing intact. OP moist without lesions.   Neck: No neck masses.  Trachea midline.  No thyromegaly/tenderness. Lymph: No cervical or supraclavicular lymphadenopathy. Skin: Warm and dry.  No jaundice.  No suspicious rashes or lesions. Cardiac: RRR, nl S1-S2, no murmurs appreciated.  No LE edema.  Radial and pedal pulses 2+ and symmetric. Respiratory: Normal respiratory rate and rhythm.  CTAB without rales or wheezes. Abdomen: Abdomen obese, slight tenderness with deep palpation.  Bowel sounds present MSK: No deformities or effusions of the large joints of the upper or lower extremities bilaterally.  No cyanosis or clubbing. Neuro: Cranial nerves 2 through 12 grossly intact.  Sensation intact to light touch. Speech is fluent.     Psych: Sensorium intact and responding to questions, attention normal.  Behavior appropriate.  Judgment and insight appear normal.    Labs on Admission:  I have personally reviewed following labs and imaging studies: CBC: Recent Labs  Lab 06/23/21 1109  WBC 12.4*  HGB 16.7  HCT 48.2  MCV 87.6  PLT 277   Basic Metabolic Panel: Recent Labs  Lab 06/23/21 1109  NA 138  K 3.4*  CL 105  CO2 25  GLUCOSE 115*  BUN 13  CREATININE 0.94  CALCIUM 9.0   GFR: Estimated Creatinine Clearance: 108.3 mL/min (by C-G formula based on SCr of 0.94 mg/dL).  Liver Function  Tests: Recent Labs  Lab 06/23/21 1109  AST 18  ALT 10  ALKPHOS 86  BILITOT 0.8  PROT 8.9*  ALBUMIN 3.6   Recent Labs  Lab 06/23/21 1109  LIPASE 22   No results for input(s): AMMONIA in the last 168 hours. Coagulation Profile: No results for input(s): INR, PROTIME in the last 168 hours. Cardiac Enzymes: No results for input(s): CKTOTAL, CKMB, CKMBINDEX, TROPONINI in the last 168 hours. BNP (last 3 results) No results for input(s): PROBNP in the last 8760 hours. HbA1C: No results for input(s): HGBA1C in the last 72 hours. CBG: No results for input(s): GLUCAP in the last 168 hours. Lipid Profile: No results for input(s): CHOL, HDL, LDLCALC, TRIG, CHOLHDL, LDLDIRECT in the last 72 hours. Thyroid Function Tests: No results for input(s): TSH, T4TOTAL, FREET4, T3FREE, THYROIDAB in the last 72 hours. Anemia Panel: No results for input(s): VITAMINB12, FOLATE, FERRITIN, TIBC, IRON, RETICCTPCT in the last 72 hours.   Recent Results (from the past 240 hour(s))  Resp Panel by RT-PCR (Flu A&B, Covid) Nasopharyngeal Swab     Status: Abnormal   Collection Time: 06/23/21  1:12 PM   Specimen: Nasopharyngeal Swab; Nasopharyngeal(NP) swabs in vial transport medium  Result Value Ref Range Status   SARS Coronavirus 2 by RT PCR POSITIVE (A) NEGATIVE Final    Comment: RESULT CALLED TO, READ BACK BY AND VERIFIED WITH:  COBLE,S RN @1403  06/23/21 EDENSCA (NOTE) SARS-CoV-2 target nucleic acids are DETECTED.  The SARS-CoV-2 RNA is generally detectable in upper respiratory specimens during the acute phase of infection. Positive results are indicative of the presence of the identified virus, but do not rule out bacterial infection or co-infection with other pathogens not detected by the test. Clinical correlation with patient history and other diagnostic information is necessary to determine patient infection status. The expected result is Negative.  Fact Sheet for  Patients: 08/24/21  Fact Sheet for Healthcare Providers: BloggerCourse.com  This test is not yet approved or cleared by the [employer] FDA and  has been authorized for detection and/or diagnosis of SARS-CoV-2 by FDA under an Emergency Use Authorization (EUA).  This EUA will remain in effect (meaning this test can be  used) for the duration of  the COVID-19 declaration under Section 564(b)(1) of the Act, 21 U.S.C. section 360bbb-3(b)(1), unless the authorization is terminated or revoked sooner.     Influenza A by PCR NEGATIVE NEGATIVE Final   Influenza B by PCR NEGATIVE NEGATIVE Final    Comment: (NOTE) The Xpert Xpress SARS-CoV-2/FLU/RSV plus assay is intended as an aid in the diagnosis of influenza from Nasopharyngeal swab specimens and should not be used as a sole basis for treatment. Nasal washings and aspirates are unacceptable for Xpert Xpress SARS-CoV-2/FLU/RSV testing.  Fact Sheet for Patients: [place]  Fact Sheet for Healthcare Providers: BloggerCourse.com  This test is not yet approved or cleared by the [employer] FDA  and has been authorized for detection and/or diagnosis of SARS-CoV-2 by FDA under an Emergency Use Authorization (EUA). This EUA will remain in effect (meaning this test can be used) for the duration of the COVID-19 declaration under Section 564(b)(1) of the Act, 21 U.S.C. section 360bbb-3(b)(1), unless the authorization is terminated or revoked.  Performed at [facility], [address] [place], [place] [address]            Radiological Exams on Admission: Personally reviewed imaging which shows: CT abdomen pelvis with contrast showed large obstructing colonic mass CT ABDOMEN PELVIS W CONTRAST  Result Date: 06/23/2021 CLINICAL DATA:  Abdominal distension, constipation EXAM: CT ABDOMEN AND PELVIS WITH CONTRAST  TECHNIQUE: Multidetector CT imaging of the abdomen and pelvis was performed using the standard protocol following bolus administration of intravenous contrast. CONTRAST:  OMNIPAQUE IOHEXOL 300 MG/ML  SOLN COMPARISON:  None. FINDINGS: Lower chest: The included lung bases are clear. Heart size within normal limits. Hepatobiliary: No focal liver abnormality is seen. No gallstones, gallbladder wall thickening, or biliary dilatation. Pancreas: Unremarkable. No pancreatic ductal dilatation or surrounding inflammatory changes. Spleen: Normal in size without focal abnormality. Adrenals/Urinary Tract: Unremarkable adrenal glands. No adrenal nodules or masses. Simple 1.6 cm lower pole right renal cyst. Simple 1.3 cm cyst within the interpolar region of the left kidney. No renal stone or hydronephrosis. Urinary bladder is incompletely distended, limiting its evaluation. Stomach/Bowel: Large mass within the proximal sigmoid colon with approximate measurements of 8.6 x 3.8 x 3.6 cm (series 2, images 65-77). Multiple small adjacent pericolonic lymph nodes concerning for metastatic disease. Mass completely occupies the colonic lumen at this location. There is associated obstruction of large and small bowel upstream from this level. Fecalization of small bowel content. No pneumatosis is seen. An air-filled appendix is present. Stomach is mildly distended but otherwise within normal limits. Vascular/Lymphatic: No vascular abnormality is identified. Scattered nonenlarged retroperitoneal lymph nodes are nonspecific. Reproductive: Mildly enlarged prostate gland. Other: No free fluid. No abdominopelvic fluid collection. No pneumoperitoneum. No abdominal wall hernia. Musculoskeletal: No acute osseous abnormality. No suspicious bone lesion. Severe osteoarthritis of the right hip. IMPRESSION: 1. Large obstructing colonic mass within the proximal sigmoid colon measuring up to 8.6 cm. There is associated mechanical obstruction of the  large and small bowel upstream from this mass. Surgical evaluation is recommended. 2. Multiple mildly prominent pericolonic lymph nodes concerning for metastatic disease. Scattered nonenlarged retroperitoneal lymph nodes are nonspecific. 3. Mildly enlarged prostate gland. These results were called by telephone at the time of interpretation on 06/23/2021 at 1:04 pm to provider [facility] , who verbally acknowledged these results. Electronically Signed   By: Duanne Guess D.O.   On: 06/23/2021 13:09   DG Chest Portable 1 View  Result Date: 06/23/2021 CLINICAL DATA:  Edema. EXAM: PORTABLE CHEST 1 VIEW COMPARISON:  April 07, 2016. FINDINGS: Stable cardiomegaly. Both lungs are clear. The visualized skeletal structures are unremarkable. IMPRESSION: No active disease. Electronically Signed   By: Lupita Raider M.D.   On: 06/23/2021 12:55          Assessment/Plan   1.  Colonic mass -CT abdomen pelvis with contrast showed large obstructing colonic mass -Patient says he had a [place] in [place], [place] -ER contacted general surgery and will follow - N.p.o. at midnight for possible surgery in the morning - IV fluids - Pain control as warranted  2.  COVID positive -Patient noted to be positive on  admission -Chest x-ray on admission unremarkable -Patient asymptomatic this time, will just monitor for now  3.  Hypokalemia -Potassium 3.4 admission - Supplementation ordered - Magnesium level added on - Follow-up a.m. labs ordered  4.  Morbid obesity -Recommend diet and weight loss    DVT prophylaxis: SCDs given possible surgery Code Status: Full Family Communication: Patient only Disposition Plan: Anticipate discharge home when medically optimized Consults called: Surgery contacted by ER Admission status: Inpatient     Medical decision making: Patient seen at 8:15 PM on 06/23/2021.  The patient was discussed with ER provider.  What exists of the patient's chart  was reviewed in depth and summarized above.  Clinical condition: Fair.        Laqueta Dueichard G Addalynne Golding [facility] Please page though AMION or Epic secure chat:  For password, contact charge nurse

## 2021-06-24 ENCOUNTER — Inpatient Hospital Stay (HOSPITAL_COMMUNITY): Payer: Medicare Other | Admitting: Certified Registered Nurse Anesthetist

## 2021-06-24 ENCOUNTER — Encounter (HOSPITAL_COMMUNITY): Admission: EM | Disposition: A | Discharge status: Skilled Nursing Facility | Payer: Self-pay | Source: Home / Self Care

## 2021-06-24 DIAGNOSIS — N400 Enlarged prostate without lower urinary tract symptoms: Secondary | ICD-10-CM | POA: Diagnosis present

## 2021-06-24 DIAGNOSIS — K56609 Unspecified intestinal obstruction, unspecified as to partial versus complete obstruction: Secondary | ICD-10-CM

## 2021-06-24 DIAGNOSIS — K6389 Other specified diseases of intestine: Secondary | ICD-10-CM | POA: Diagnosis not present

## 2021-06-24 DIAGNOSIS — M7660 Achilles tendinitis, unspecified leg: Secondary | ICD-10-CM | POA: Insufficient documentation

## 2021-06-24 DIAGNOSIS — N529 Male erectile dysfunction, unspecified: Secondary | ICD-10-CM | POA: Insufficient documentation

## 2021-06-24 DIAGNOSIS — Z22322 Carrier or suspected carrier of Methicillin resistant Staphylococcus aureus: Secondary | ICD-10-CM

## 2021-06-24 DIAGNOSIS — E65 Localized adiposity: Secondary | ICD-10-CM

## 2021-06-24 DIAGNOSIS — U071 COVID-19: Secondary | ICD-10-CM | POA: Diagnosis not present

## 2021-06-24 DIAGNOSIS — Z933 Colostomy status: Secondary | ICD-10-CM

## 2021-06-24 HISTORY — PX: COLECTOMY WITH COLOSTOMY CREATION/HARTMANN PROCEDURE: SHX6598

## 2021-06-24 LAB — HEMOGLOBIN A1C
Hgb A1c MFr Bld: 5.1 % (ref 4.8–5.6)
Mean Plasma Glucose: 99.67 mg/dL

## 2021-06-24 LAB — COMPREHENSIVE METABOLIC PANEL
ALT: 11 U/L (ref 0–44)
AST: 12 U/L — ABNORMAL LOW (ref 15–41)
Albumin: 3.1 g/dL — ABNORMAL LOW (ref 3.5–5.0)
Alkaline Phosphatase: 69 U/L (ref 38–126)
Anion gap: 10 (ref 5–15)
BUN: 19 mg/dL (ref 8–23)
CO2: 25 mmol/L (ref 22–32)
Calcium: 8.3 mg/dL — ABNORMAL LOW (ref 8.9–10.3)
Chloride: 106 mmol/L (ref 98–111)
Creatinine, Ser: 1.01 mg/dL (ref 0.61–1.24)
GFR, Estimated: >60 mL/min (ref >60)
Glucose, Bld: 95 mg/dL (ref 70–99)
Potassium: 3.3 mmol/L — ABNORMAL LOW (ref 3.5–5.1)
Sodium: 141 mmol/L (ref 135–145)
Total Bilirubin: 1.3 mg/dL — ABNORMAL HIGH (ref 0.3–1.2)
Total Protein: 7.4 g/dL (ref 6.5–8.1)

## 2021-06-24 LAB — CBC
HCT: 44.9 % (ref 39.0–52.0)
Hemoglobin: 14.8 g/dL (ref 13.0–17.0)
MCH: 29.6 pg (ref 26.0–34.0)
MCHC: 33 g/dL (ref 30.0–36.0)
MCV: 89.8 fL (ref 80.0–100.0)
Platelets: 238 10*3/uL (ref 150–400)
RBC: 5 MIL/uL (ref 4.22–5.81)
RDW: 13.8 % (ref 11.5–15.5)
WBC: 11 10*3/uL — ABNORMAL HIGH (ref 4.0–10.5)
nRBC: 0 % (ref 0.0–0.2)

## 2021-06-24 LAB — SURGICAL PCR SCREEN
MRSA, PCR: POSITIVE — AB
Staphylococcus aureus: POSITIVE — AB

## 2021-06-24 LAB — MAGNESIUM: Magnesium: 2.1 mg/dL (ref 1.7–2.4)

## 2021-06-24 LAB — PROTIME-INR
INR: 1.1 (ref 0.8–1.2)
Prothrombin Time: 14.2 seconds (ref 11.4–15.2)

## 2021-06-24 LAB — MRSA NEXT GEN BY PCR, NASAL: MRSA by PCR Next Gen: DETECTED — AB

## 2021-06-24 LAB — APTT: aPTT: 31 seconds (ref 24–36)

## 2021-06-24 LAB — HIV ANTIBODY (ROUTINE TESTING W REFLEX): HIV Screen 4th Generation wRfx: NONREACTIVE

## 2021-06-24 LAB — PHOSPHORUS: Phosphorus: 3.8 mg/dL (ref 2.5–4.6)

## 2021-06-24 SURGERY — COLECTOMY, WITH COLOSTOMY CREATION
Anesthesia: General | Site: Abdomen

## 2021-06-24 MED ORDER — MAGIC MOUTHWASH
15.0000 mL | Freq: Four times a day (QID) | ORAL | Status: DC | PRN
  Administered 2021-06-30 – 2021-07-05 (×3): 15 mL via ORAL
  Filled 2021-06-24 (×4): qty 15

## 2021-06-24 MED ORDER — ALUM & MAG HYDROXIDE-SIMETH 200-200-20 MG/5ML PO SUSP: 30.0000 mL | Freq: Four times a day (QID) | ORAL | Status: DC | PRN

## 2021-06-24 MED ORDER — VASOPRESSIN 20 UNIT/ML IV SOLN
INTRAVENOUS | Status: AC
  Filled 2021-06-24: qty 1

## 2021-06-24 MED ORDER — LIDOCAINE 2% (20 MG/ML) 5 ML SYRINGE
INTRAMUSCULAR | Status: AC
  Filled 2021-06-24: qty 10

## 2021-06-24 MED ORDER — PHENYLEPHRINE HCL-NACL 10-0.9 MG/250ML-% IV SOLN
INTRAVENOUS | Status: DC | PRN
  Administered 2021-06-24: 100 ug/min via INTRAVENOUS
  Administered 2021-06-24: 50 ug/min via INTRAVENOUS

## 2021-06-24 MED ORDER — LIP MEDEX EX OINT
1.0000 "application " | TOPICAL_OINTMENT | Freq: Two times a day (BID) | CUTANEOUS | Status: DC
  Administered 2021-06-24 – 2021-07-17 (×42): 1 via TOPICAL
  Filled 2021-06-24 (×8): qty 7

## 2021-06-24 MED ORDER — ENALAPRILAT 1.25 MG/ML IV SOLN: 0.6250 mg | Freq: Four times a day (QID) | INTRAVENOUS | Status: DC | PRN

## 2021-06-24 MED ORDER — FENTANYL CITRATE (PF) 100 MCG/2ML IJ SOLN
25.0000 ug | INTRAMUSCULAR | Status: DC | PRN
  Administered 2021-06-24 (×2): 50 ug via INTRAVENOUS

## 2021-06-24 MED ORDER — LACTATED RINGERS IV BOLUS
1000.0000 mL | Freq: Three times a day (TID) | INTRAVENOUS | Status: DC | PRN
  Administered 2021-06-25: 1000 mL via INTRAVENOUS

## 2021-06-24 MED ORDER — PHENYLEPHRINE HCL-NACL 10-0.9 MG/250ML-% IV SOLN
INTRAVENOUS | Status: AC
  Filled 2021-06-24: qty 250

## 2021-06-24 MED ORDER — CHLORHEXIDINE GLUCONATE 0.12 % MT SOLN
15.0000 mL | Freq: Two times a day (BID) | OROMUCOSAL | Status: DC
  Administered 2021-06-24 – 2021-07-19 (×49): 15 mL via OROMUCOSAL
  Filled 2021-06-24 (×46): qty 15

## 2021-06-24 MED ORDER — PROPOFOL 10 MG/ML IV BOLUS
INTRAVENOUS | Status: DC | PRN
  Administered 2021-06-24: 180 mg via INTRAVENOUS

## 2021-06-24 MED ORDER — 0.9 % SODIUM CHLORIDE (POUR BTL) OPTIME
TOPICAL | Status: DC | PRN
  Administered 2021-06-24: 2000 mL

## 2021-06-24 MED ORDER — PHENYLEPHRINE HCL (PRESSORS) 10 MG/ML IV SOLN
INTRAVENOUS | Status: AC
  Filled 2021-06-24: qty 1

## 2021-06-24 MED ORDER — ROCURONIUM BROMIDE 10 MG/ML (PF) SYRINGE
PREFILLED_SYRINGE | INTRAVENOUS | Status: AC
  Filled 2021-06-24: qty 10

## 2021-06-24 MED ORDER — METOPROLOL TARTRATE 5 MG/5ML IV SOLN
5.0000 mg | Freq: Four times a day (QID) | INTRAVENOUS | Status: DC | PRN
Start: 2021-06-24 — End: 2021-07-20

## 2021-06-24 MED ORDER — SUCCINYLCHOLINE CHLORIDE 200 MG/10ML IV SOSY
PREFILLED_SYRINGE | INTRAVENOUS | Status: DC | PRN
  Administered 2021-06-24: 160 mg via INTRAVENOUS

## 2021-06-24 MED ORDER — BUPIVACAINE LIPOSOME 1.3 % IJ SUSP
20.0000 mL | Freq: Once | INTRAMUSCULAR | Status: DC
  Filled 2021-06-24: qty 20

## 2021-06-24 MED ORDER — VASOPRESSIN 20 UNIT/ML IV SOLN
INTRAVENOUS | Status: DC | PRN
  Administered 2021-06-24: 2 [IU] via INTRAVENOUS
  Administered 2021-06-24 (×2): 1 [IU] via INTRAVENOUS

## 2021-06-24 MED ORDER — MIDAZOLAM HCL 2 MG/2ML IJ SOLN
INTRAMUSCULAR | Status: AC
  Filled 2021-06-24: qty 2

## 2021-06-24 MED ORDER — FENTANYL CITRATE (PF) 100 MCG/2ML IJ SOLN
INTRAMUSCULAR | Status: AC
  Administered 2021-06-24: 50 ug via INTRAVENOUS
  Filled 2021-06-24: qty 4

## 2021-06-24 MED ORDER — LIDOCAINE 2% (20 MG/ML) 5 ML SYRINGE
INTRAMUSCULAR | Status: DC | PRN
  Administered 2021-06-24: 60 mg via INTRAVENOUS

## 2021-06-24 MED ORDER — GABAPENTIN 300 MG PO CAPS
300.0000 mg | ORAL_CAPSULE | ORAL | Status: AC
  Administered 2021-06-24: 300 mg via ORAL
  Filled 2021-06-24: qty 1

## 2021-06-24 MED ORDER — ORAL CARE MOUTH RINSE: 15.0000 mL | Freq: Two times a day (BID) | OROMUCOSAL | Status: DC

## 2021-06-24 MED ORDER — MENTHOL 3 MG MT LOZG: 1.0000 | LOZENGE | OROMUCOSAL | Status: DC | PRN

## 2021-06-24 MED ORDER — ROCURONIUM BROMIDE 10 MG/ML (PF) SYRINGE
PREFILLED_SYRINGE | INTRAVENOUS | Status: DC | PRN
  Administered 2021-06-24: 20 mg via INTRAVENOUS
  Administered 2021-06-24: 70 mg via INTRAVENOUS
  Administered 2021-06-24: 10 mg via INTRAVENOUS

## 2021-06-24 MED ORDER — PROCHLORPERAZINE EDISYLATE 10 MG/2ML IJ SOLN: 5.0000 mg | INTRAMUSCULAR | Status: DC | PRN

## 2021-06-24 MED ORDER — HYDROMORPHONE HCL 1 MG/ML IJ SOLN
0.5000 mg | INTRAMUSCULAR | Status: DC | PRN
  Administered 2021-06-24 – 2021-06-25 (×7): 1 mg via INTRAVENOUS
  Administered 2021-06-25 (×2): 1.5 mg via INTRAVENOUS
  Administered 2021-06-25 – 2021-06-26 (×4): 1 mg via INTRAVENOUS
  Filled 2021-06-24 (×4): qty 1
  Filled 2021-06-24 (×2): qty 2
  Filled 2021-06-24 (×5): qty 1
  Filled 2021-06-24: qty 2
  Filled 2021-06-24 (×2): qty 1

## 2021-06-24 MED ORDER — DEXAMETHASONE SODIUM PHOSPHATE 10 MG/ML IJ SOLN
INTRAMUSCULAR | Status: AC
  Filled 2021-06-24: qty 1

## 2021-06-24 MED ORDER — ENOXAPARIN SODIUM 40 MG/0.4ML IJ SOSY
40.0000 mg | PREFILLED_SYRINGE | Freq: Once | INTRAMUSCULAR | Status: AC
  Administered 2021-06-24: 40 mg via SUBCUTANEOUS
  Filled 2021-06-24: qty 0.4

## 2021-06-24 MED ORDER — CHLORHEXIDINE GLUCONATE CLOTH 2 % EX PADS
6.0000 | MEDICATED_PAD | Freq: Every day | CUTANEOUS | Status: DC
  Administered 2021-06-24 – 2021-06-30 (×7): 6 via TOPICAL

## 2021-06-24 MED ORDER — ONDANSETRON HCL 4 MG/2ML IJ SOLN: 4.0000 mg | Freq: Once | INTRAMUSCULAR | Status: DC | PRN

## 2021-06-24 MED ORDER — ONDANSETRON HCL 4 MG/2ML IJ SOLN
INTRAMUSCULAR | Status: DC | PRN
  Administered 2021-06-24: 4 mg via INTRAVENOUS

## 2021-06-24 MED ORDER — PROPOFOL 10 MG/ML IV BOLUS
INTRAVENOUS | Status: AC
  Filled 2021-06-24: qty 20

## 2021-06-24 MED ORDER — OXYCODONE HCL 5 MG/5ML PO SOLN
5.0000 mg | Freq: Once | ORAL | Status: DC | PRN
Start: 2021-06-24 — End: 2021-06-24

## 2021-06-24 MED ORDER — LACTATED RINGERS IV SOLN: INTRAVENOUS | Status: DC | PRN

## 2021-06-24 MED ORDER — MIDAZOLAM HCL 5 MG/5ML IJ SOLN
INTRAMUSCULAR | Status: DC | PRN
  Administered 2021-06-24: 2 mg via INTRAVENOUS

## 2021-06-24 MED ORDER — ALVIMOPAN 12 MG PO CAPS
12.0000 mg | ORAL_CAPSULE | ORAL | Status: AC
  Administered 2021-06-24: 12 mg via ORAL
  Filled 2021-06-24: qty 1

## 2021-06-24 MED ORDER — FENTANYL CITRATE (PF) 250 MCG/5ML IJ SOLN
INTRAMUSCULAR | Status: DC | PRN
  Administered 2021-06-24: 100 ug via INTRAVENOUS

## 2021-06-24 MED ORDER — ALBUMIN HUMAN 5 % IV SOLN
INTRAVENOUS | Status: AC
  Filled 2021-06-24: qty 500

## 2021-06-24 MED ORDER — SIMETHICONE 40 MG/0.6ML PO SUSP
80.0000 mg | Freq: Four times a day (QID) | ORAL | Status: DC | PRN
  Filled 2021-06-24 (×2): qty 1.2

## 2021-06-24 MED ORDER — PHENOL 1.4 % MT LIQD: 2.0000 | OROMUCOSAL | Status: DC | PRN

## 2021-06-24 MED ORDER — OXYCODONE HCL 5 MG PO TABS: 5.0000 mg | ORAL_TABLET | Freq: Once | ORAL | Status: DC | PRN

## 2021-06-24 MED ORDER — POTASSIUM CHLORIDE 20 MEQ PO PACK
40.0000 meq | PACK | Freq: Once | ORAL | Status: AC
  Administered 2021-06-24: 40 meq via ORAL
  Filled 2021-06-24: qty 2

## 2021-06-24 MED ORDER — SUGAMMADEX SODIUM 200 MG/2ML IV SOLN
INTRAVENOUS | Status: DC | PRN
  Administered 2021-06-24: 300 mg via INTRAVENOUS

## 2021-06-24 MED ORDER — METHOCARBAMOL 1000 MG/10ML IJ SOLN
1000.0000 mg | Freq: Four times a day (QID) | INTRAVENOUS | Status: DC | PRN
  Filled 2021-06-24: qty 10

## 2021-06-24 MED ORDER — PHENYLEPHRINE 40 MCG/ML (10ML) SYRINGE FOR IV PUSH (FOR BLOOD PRESSURE SUPPORT)
PREFILLED_SYRINGE | INTRAVENOUS | Status: DC | PRN
  Administered 2021-06-24 (×2): 120 ug via INTRAVENOUS
  Administered 2021-06-24: 80 ug via INTRAVENOUS

## 2021-06-24 MED ORDER — SODIUM CHLORIDE 0.9 % IV SOLN
2.0000 g | INTRAVENOUS | Status: AC
  Administered 2021-06-24: 2 g via INTRAVENOUS
  Filled 2021-06-24: qty 2

## 2021-06-24 MED ORDER — ALBUMIN HUMAN 5 % IV SOLN: INTRAVENOUS | Status: DC | PRN

## 2021-06-24 MED ORDER — DIPHENHYDRAMINE HCL 50 MG/ML IJ SOLN: 12.5000 mg | Freq: Four times a day (QID) | INTRAMUSCULAR | Status: DC | PRN

## 2021-06-24 MED ORDER — DEXAMETHASONE SODIUM PHOSPHATE 10 MG/ML IJ SOLN
INTRAMUSCULAR | Status: DC | PRN
  Administered 2021-06-24: 7 mg via INTRAVENOUS

## 2021-06-24 MED ORDER — ACETAMINOPHEN 500 MG PO TABS
1000.0000 mg | ORAL_TABLET | ORAL | Status: AC
  Administered 2021-06-24: 1000 mg via ORAL
  Filled 2021-06-24: qty 2

## 2021-06-24 MED ORDER — ONDANSETRON HCL 4 MG/2ML IJ SOLN
INTRAMUSCULAR | Status: AC
  Filled 2021-06-24: qty 2

## 2021-06-24 MED ORDER — FENTANYL CITRATE (PF) 250 MCG/5ML IJ SOLN
INTRAMUSCULAR | Status: AC
  Filled 2021-06-24: qty 5

## 2021-06-24 MED ORDER — ORAL CARE MOUTH RINSE
15.0000 mL | Freq: Two times a day (BID) | OROMUCOSAL | Status: DC
  Administered 2021-06-24 – 2021-07-17 (×41): 15 mL via OROMUCOSAL

## 2021-06-24 MED ORDER — BUPIVACAINE LIPOSOME 1.3 % IJ SUSP
INTRAMUSCULAR | Status: DC | PRN
  Administered 2021-06-24: 20 mL

## 2021-06-24 MED ORDER — SODIUM CHLORIDE 0.9 % IV SOLN
2.0000 g | Freq: Two times a day (BID) | INTRAVENOUS | Status: AC
  Administered 2021-06-24: 2 g via INTRAVENOUS
  Filled 2021-06-24: qty 2

## 2021-06-24 SURGICAL SUPPLY — 53 items
BAG COUNTER SPONGE SURGICOUNT (BAG) IMPLANT
CATH DRAINAGE MALECOT 26FR (CATHETERS) IMPLANT
CATH MALECOT (CATHETERS) ×2
CHLORAPREP W/TINT 26 (MISCELLANEOUS) IMPLANT
COVER MAYO STAND STRL (DRAPES) ×2 IMPLANT
COVER SURGICAL LIGHT HANDLE (MISCELLANEOUS) ×2 IMPLANT
DRAIN CHANNEL 19F RND (DRAIN) IMPLANT
DRAPE LAPAROSCOPIC ABDOMINAL (DRAPES) ×2 IMPLANT
DRSG OPSITE POSTOP 4X10 (GAUZE/BANDAGES/DRESSINGS) IMPLANT
DRSG OPSITE POSTOP 4X6 (GAUZE/BANDAGES/DRESSINGS) IMPLANT
DRSG OPSITE POSTOP 4X8 (GAUZE/BANDAGES/DRESSINGS) IMPLANT
DRSG TEGADERM 4X4.75 (GAUZE/BANDAGES/DRESSINGS) ×1 IMPLANT
ELECT REM PT RETURN 15FT ADLT (MISCELLANEOUS) ×2 IMPLANT
EVACUATOR SILICONE 100CC (DRAIN) IMPLANT
GLOVE SURG ENC MOIS LTX SZ6 (GLOVE) ×4 IMPLANT
GLOVE SURG UNDER LTX SZ6.5 (GLOVE) ×4 IMPLANT
GOWN STRL REUS W/TWL LRG LVL3 (GOWN DISPOSABLE) ×2 IMPLANT
GOWN STRL REUS W/TWL XL LVL3 (GOWN DISPOSABLE) ×2 IMPLANT
HANDLE SUCTION POOLE (INSTRUMENTS) IMPLANT
KIT TURNOVER KIT A (KITS) ×2 IMPLANT
LIGASURE IMPACT 36 18CM CVD LR (INSTRUMENTS) ×1 IMPLANT
PACK GENERAL/GYN (CUSTOM PROCEDURE TRAY) ×2 IMPLANT
PENCIL SMOKE EVACUATOR (MISCELLANEOUS) IMPLANT
POUCH OSTOMY FLEX CONVEX 2-1/8 (OSTOMY) ×1 IMPLANT
RELOAD AUTO 90-4.8 TA90 GRN (ENDOMECHANICALS) ×2 IMPLANT
RELOAD STAPLE 90 GRN THCK DST (ENDOMECHANICALS) IMPLANT
RETRACTOR WND ALEXIS 25 LRG (MISCELLANEOUS) IMPLANT
RTRCTR WOUND ALEXIS 25CM LRG (MISCELLANEOUS) ×2
SPONGE GAUZE 2X2 8PLY STRL LF (GAUZE/BANDAGES/DRESSINGS) ×1 IMPLANT
SPONGE T-LAP 18X18 ~~LOC~~+RFID (SPONGE) ×5 IMPLANT
STAPLER 90 3.5 STAND SLIM (STAPLE) ×2
STAPLER 90 3.5 STD SLIM (STAPLE) IMPLANT
STAPLER VISISTAT 35W (STAPLE) IMPLANT
SUCTION POOLE HANDLE (INSTRUMENTS) ×2
SUT ETHILON 2 0 PS N (SUTURE) ×1 IMPLANT
SUT ETHILON 3 0 PS 1 (SUTURE) IMPLANT
SUT MNCRL AB 4-0 PS2 18 (SUTURE) IMPLANT
SUT NOVA T20/GS 25 (SUTURE) IMPLANT
SUT PDS AB 1 TP1 96 (SUTURE) ×2 IMPLANT
SUT PROLENE 2 0 CT2 30 (SUTURE) ×1 IMPLANT
SUT PROLENE 2 0 SH DA (SUTURE) ×1 IMPLANT
SUT SILK 2 0 (SUTURE) ×1
SUT SILK 2 0 SH CR/8 (SUTURE) ×2 IMPLANT
SUT SILK 2-0 18XBRD TIE 12 (SUTURE) ×1 IMPLANT
SUT SILK 3 0 (SUTURE) ×1
SUT SILK 3 0 SH CR/8 (SUTURE) ×2 IMPLANT
SUT SILK 3-0 18XBRD TIE 12 (SUTURE) ×1 IMPLANT
SUT VIC AB 2-0 SH 18 (SUTURE) ×3 IMPLANT
SUT VLOC 180 2-0 9IN GS21 (SUTURE) ×1 IMPLANT
TOWEL OR 17X26 10 PK STRL BLUE (TOWEL DISPOSABLE) ×4 IMPLANT
TOWEL OR NON WOVEN STRL DISP B (DISPOSABLE) ×2 IMPLANT
TRAY FOLEY MTR SLVR 14FR STAT (SET/KITS/TRAYS/PACK) ×2 IMPLANT
TRAY FOLEY MTR SLVR 16FR STAT (SET/KITS/TRAYS/PACK) ×2 IMPLANT

## 2021-06-24 NOTE — Discharge Instructions (Addendum)
[facility]      [facility] °[phone] ° °OPEN ABDOMINAL SURGERY: POST OP INSTRUCTIONS ° °Always review your discharge instruction sheet given to you by the facility where your surgery was performed. ° °IF YOU HAVE DISABILITY OR FAMILY LEAVE FORMS, YOU MUST BRING THEM TO THE OFFICE FOR PROCESSING.  PLEASE DO NOT GIVE THEM TO YOUR DOCTOR. ° °A prescription for pain medication may be given to you upon discharge.  Take your pain medication as prescribed, if needed.  If narcotic pain medicine is not needed, then you may take acetaminophen (Tylenol) or ibuprofen (Advil) as needed. °Take your usually prescribed medications unless otherwise directed. °If you need a refill on your pain medication, please contact your pharmacy. They will contact our office to request authorization.  Prescriptions will not be filled after 5pm or on week-ends. °You should follow a light diet the first few days after arrival home, such as soup and crackers, pudding, etc.unless your doctor has advised otherwise. A high-fiber, low fat diet can be resumed as tolerated.   Be sure to include lots of fluids daily. Most patients will experience some swelling and bruising on the chest and neck area.  Ice packs will help.  Swelling and bruising can take several days to resolve °Most patients will experience some swelling and bruising in the area of the incision. Ice pack will help. Swelling and bruising can take several days to resolve..  °It is common to experience some constipation if taking pain medication after surgery.  Increasing fluid intake and taking a stool softener will usually help or prevent this problem from occurring.  A mild laxative (Milk of Magnesia or Miralax) should be taken according to package directions if there are no bowel movements after 48 hours. ° You may have steri-strips (small skin tapes) in place directly over the incision.  These strips should be left on the skin for 7-10 days.  If your surgeon used skin  glue on the incision, you may shower in 24 hours.  The glue will flake off over the next 2-3 weeks.  Any sutures or staples will be removed at the office during your follow-up visit. You may find that a light gauze bandage over your incision may keep your staples from being rubbed or pulled. You may shower and replace the bandage daily. °ACTIVITIES:  You may resume regular (light) daily activities beginning the next day--such as daily self-care, walking, climbing stairs--gradually increasing activities as tolerated.  You may have sexual intercourse when it is comfortable.  Refrain from any heavy lifting or straining until approved by your doctor. °You may drive when you no longer are taking prescription pain medication, you can comfortably wear a seatbelt, and you can safely maneuver your car and apply brakes ° °You should see your doctor in the office for a follow-up appointment approximately two weeks after your surgery.  Make sure that you call for this appointment within a day or two after you arrive home to insure a convenient appointment time. ° °WHEN TO CALL YOUR DOCTOR: °Fever over 101.0 °Inability to urinate °Nausea and/or vomiting °Extreme swelling or bruising °Continued bleeding from incision. °Increased pain, redness, or drainage from the incision. °Difficulty swallowing or breathing °Muscle cramping or spasms. °Numbness or tingling in hands or feet or around lips. ° °The clinic staff is available to answer your questions during regular business hours.  Please don’t hesitate to call and ask to speak to one of the nurses if you have concerns. ° °For   For further questions, please visit [email or web address]    Ostomy Support Information  You've heard that people get along just fine with only one of their eyes, or one of their lungs, or one of their kidneys. But you also know that you have only one intestine and only one bladder, and that leaves you feeling awfully empty, both physically and  emotionally: You think no other people go around without part of their intestine with the ends of their intestines sticking out through their abdominal walls.   YOU ARE NOT ALONE.  There are nearly three quarters of a million people in the [place] who have an ostomy; people who have had surgery to remove all or part of their colons or bladders.   There is even a national association, the [place] [employer] [place] with over 350 local affiliated support groups that are organized by volunteers who provide peer support and counseling. [employer] has a toll free telephone num-ber, [phone] and an educational, interactive website, www.ostomy.org   An ostomy is an opening in the belly (abdominal wall) made by surgery. Ostomates are people who have had this procedure. The opening (stoma) allows the kidney or bowel to discharge waste. An external pouch covers the stoma to collect waste. Pouches are are a simple bag and are odor free. Different companies have disposable or reusable pouches to fit one's lifestyle. An ostomy can either be temporary or permanent.   THERE ARE THREE MAIN TYPES OF OSTOMIES Colostomy. A colostomy is a surgically created opening in the large intestine (colon). Ileostomy. An ileostomy is a surgically created opening in the small intestine. Urostomy. A urostomy is a surgically created opening to divert urine away from the bladder.  OSTOMY Care  The following guidelines will make care of your colostomy easier. Keep this information close by for quick reference.  Helpful DIET hints Eat a well-balanced diet including vegetables and fresh fruits. Eat on a regular schedule.  Drink at least 6 to 8 glasses of fluids daily. Eat slowly in a relaxed atmosphere. Chew your food thoroughly. Avoid chewing gum, smoking, and drinking from a straw. This will help decrease the amount of air you swallow, which may help reduce gas. Eating yogurt or drinking buttermilk may help reduce gas.  To  control gas at night, do not eat after 8 p.m. This will give your bowel time to quiet down before you go to bed.  If gas is a problem, you can purchase Beano. Sprinkle Beano on the first bite of food before eating to reduce gas. It has no flavor and should not change the taste of your food. You can buy Beano over the counter at your local drugstore.  Foods like fish, onions, garlic, broccoli, asparagus, and cabbage produce odor. Although your pouch is odor-proof, if you eat these foods you may notice a stronger odor when emptying your pouch. If this is a concern, you may want to limit these foods in your diet.  If you have an ileostomy, you will have chronic diarrhea & need to drink more liquids to avoid getting dehydrated.  Consider antidiarrheal medicine like imodium (loperamide) or Lomotil to help slow down bowel movements / diarrhea into your ileostomy bag.  GETTING TO GOOD BOWEL HEALTH WITH AN ILEOSTOMY    With the colon bypassed & not in use, you will have small bowel diarrhea.   It is important to thicken & slow your bowel movements down.   The goal: 4-6 small BOWEL MOVEMENTS A DAY It is  important to drink plenty of liquids to avoid getting dehydrated  CONTROLLING ILEOSTOMY DIARRHEA  TAKE A FIBER SUPPLEMENT (FiberCon or Benefiner soluble fiber) twice a day - to thicken stools by absorbing excess fluid and retrain the intestines to act more normally.  Slowly increase the dose over a few weeks.  Too much fiber too soon can backfire and cause cramping & bloating.  TAKE AN IRON SUPPLEMENT twice a day to naturally constipate your bowels.  Usually ferrous sulfate 353m twice a day)  TAKE ANTI-DIARRHEAL MEDICINES: Loperamide (Imodium) can slow down diarrhea.  Start with two tablets (= 4511m first and then try one tablet every 6 hours.  Can go up to 2 pills four times day (8 pills of 11m55max) Avoid if you are having fevers or severe pain.  If you are not better or start feeling worse, stop all  medicines and call your doctor for advice LoMotil (Diphenoxylate / Atropine) is another medicine that can constipate & slow down bowel moevements Pepto Bismol (bismuth) can gently thicken bowels as well  If diarrhea is worse,: drink plenty of liquids and try simpler foods for a few days to avoid stressing your intestines further. Avoid dairy products (especially milk & ice cream) for a short time.  The intestines often can lose the ability to digest lactose when stressed. Avoid foods that cause gassiness or bloating.  Typical foods include beans and other legumes, cabbage, broccoli, and dairy foods.  Every person has some sensitivity to other foods, so listen to our body and avoid those foods that trigger problems for you.Call your doctor if you are getting worse or not better.  Sometimes further testing (cultures, endoscopy, X-ray studies, bloodwork, etc) may be needed to help diagnose and treat the cause of the diarrhea. Take extra anti-diarrheal medicines (maximum is 8 pills of 11mg74mperamide a day)   Tips for POUCHING an OSTOMY   Changing Your Pouch The best time to change your pouch is in the morning, before eating or drinking anything. Your stoma can function at any time, but it will function more after eating or drinking.   Applying the pouching system  Place all your equipment close at hand before removing your pouch.  Wash your hands.  Stand or sit in front of a mirror. Use the position that works best for you. Remember that you must keep the skin around the stoma wrinkle-free for a good seal.  Gently remove the used pouch (1-piece system) or the pouch and old wafer (2-piece system). Empty the pouch into the toilet. Save the closure clip to use again.  Wash the stoma itself and the skin around the stoma. Your stoma may bleed a little when being washed. This is normal. Rinse and pat dry. You may use a wash cloth or soft paper towels (like Bounty), mild soap (like Dial, Safeguard,  or IvorMongoliand water. Avoid soaps that contain perfumes or lotions.  For a new pouch (1-piece system) or a new wafer (2-piece system), measure your stoma using the stoma guide in each box of supplies.  Trace the shape of your stoma onto the back of the new pouch or the back of the new wafer. Cut out the opening. Remove the paper backing and set it aside.  Optional: Apply a skin barrier powder to surrounding skin if it is irritated (bare or weeping), and dust off the excess. Optional: Apply a skin-prep wipe (such as Skin Prep or All-Kare) to the skin around the stoma, and let it  dry. Do not apply this solution if the skin is irritated (red, tender, or broken) or if you have shaved around the stoma. Optional: Apply a skin barrier paste (such as Stomahesive, Coloplast, or Premium) around the opening cut in the back of the pouch or wafer. Allow it to dry for 30 to 60 seconds.  Hold the pouch (1-piece system) or wafer (2-piece system) with the sticky side toward your body. Make sure the skin around the stoma is wrinkle-free. Center the opening on the stoma, then press firmly to your abdomen (Fig. 4). Look in the mirror to check if you are placing the pouch, or wafer, in the right position. For a 2-piece system, snap the pouch onto the wafer. Make sure it snaps into place securely.  Place your hand over the stoma and the pouch or wafer for about 30 seconds. The heat from your hand can help the pouch or wafer stick to your skin.  Add deodorant (such as Super Banish or Nullo) to your pouch. Other options include food extracts such as vanilla oil and peppermint extract. Add about 10 drops of the deodorant to the pouch. Then apply the closure clamp. Note: Do not use toxic  chemicals or commercial cleaning agents in your pouch. These substances may harm the stoma.  Optional: For extra seal, apply tape to all 4 sides around the pouch or wafer, as if you were framing a picture. You may use any brand of medical  adhesive tape. Change your pouch every 5 to 7 days. Change it immediately if a leak occurs.  Wash your hands afterwards.  If you are wearing a 2-piece system, you may use 2 new pouches per week and alternate them. Rinse the pouch with mild soap and warm water and hang it to dry for the next day. Apply the fresh pouch. Alternate the 2 pouches like this for a week. After a week, change the wafer and begin with 2 new pouches. Place the old pouches in a plastic bag, and put them in the trash.   LIVING WITH AN OSTOMY  Emptying Your Pouch Empty your pouch when it is one-third full (of urine, stool, and/or gas). If you wait until your pouch is fuller than this, it will be more difficult to empty and more noticeable. When you empty your pouch, either put toilet paper in the toilet bowl first, or flush the toilet while you empty the pouch. This will reduce splashing. You can empty the pouch between your legs or to one side while sitting, or while standing or stooping. If you have a 2-piece system, you can snap off the pouch to empty it. Remember that your stoma may function during this time. If you wish to rinse your pouch after you empty it, a [place] baster can be helpful. When using a baster, squirt water up into the pouch through the opening at the bottom. With a 2-piece system, you can snap off the pouch to rinse it. After rinsing  your pouch, empty it into the toilet. When rinsing your pouch at home, put a few granules of Dreft soap in the rinse water. This helps lubricate and freshen your pouch. The inside of your pouch can be sprayed with non-stick cooking oil (Pam spray). This may help reduce stool sticking to the inside of the pouch.  Bathing You may shower or bathe with your pouch on or off. Remember that your stoma may function during this time.  The materials you use to wash your stoma  and the skin around it should be clean, but they do not need to be sterile.  Wearing Your Pouch During hot  weather, or if you perspire a lot in general, wear a cover over your pouch. This may prevent a rash on your skin under the pouch. Pouch covers are sold at ostomy supply stores. Wear the pouch inside your underwear for better support. Watch your weight. Any gain or loss of 10 to 15 pounds or more can change the way your pouch fits.  Going Away From Home A collapsible cup (like those that come in travel kits) or a soft plastic squirt bottle with a pull-up top (like a travel bottle for shampoo) can be used for rinsing your pouch when you are away from home. Tilt the opening of the pouch at an upward angle when using a cup to rinse.  Carry wet wipes or extra tissues to use in public bathrooms.  Carry an extra pouching system with you at all times.  Never keep ostomy supplies in the glove compartment of your car. Extreme heat or cold can damage the skin barriers and adhesive wafers on the pouch.  When you travel, carry your ostomy supplies with you at all times. Keep them within easy reach. Do not pack ostomy supplies in baggage that will be checked or otherwise separated from you, because your baggage might be lost. If you're traveling out of the country, it is helpful to have a letter stating that you are carrying ostomy supplies as a medical necessity.  If you need ostomy supplies while traveling, look in the yellow pages of the telephone book under "Surgical Supplies." Or call the local ostomy organization to find out where supplies are available.  Do not let your ostomy supplies get low. Always order new pouches before you use the last one.  Reducing Odor Limit foods such as broccoli, cabbage, onions, fish, and garlic in your diet to help reduce odor. Each time you empty your pouch, carefully clean the opening of the pouch, both inside and outside, with toilet paper. Rinse your pouch 1 or 2 times daily after you empty it (see directions for emptying your pouch and going away from home). Add  deodorant (such as Super Banish or Nullo) to your pouch. Use air deodorizers in your bathroom. Do not add aspirin to your pouch. Even though aspirin can help prevent odor, it could cause ulcers on your stoma.  When to call the doctor Call the doctor if you have any of the following symptoms: Purple, black, or white stoma Severe cramps lasting more than 6 hours Severe watery discharge from the stoma lasting more than 6 hours No output from the colostomy for 3 days Excessive bleeding from your stoma Swelling of your stoma to more than 1/2-inch larger than usual Pulling inward of your stoma below skin level Severe skin irritation or deep ulcers Bulging or other changes in your abdomen  When to call your ostomy nurse Call your ostomy/enterostomal therapy (WOCN) nurse if any of the following occurs: Frequent leaking of your pouching system Change in size or appearance of your stoma, causing discomfort or problems with your pouch Skin rash or rawness Weight gain or loss that causes problems with your pouch     FREQUENTLY ASKED QUESTIONS   Why haven't you met any of these folks who have an ostomy?  Well, maybe you have! You just did not recognize them because an ostomy doesn't show. It can be kept secret if you wish.  Why, maybe some of your best friends, office associates or neighbors have an ostomy ... you never can tell. People facing ostomy surgery have many quality-of-life questions like: Will you bulge? Smell? Make noises? Will you feel waste leaving your body? Will you be a captive of the toilet? Will you starve? Be a social outcast? Get/stay married? Have babies? Easily bathe, go swimming, bend over?  OK, let's look at what you can expect:   Will you bulge?  Remember, without part of the intestine or bladder, and its contents, you should have a flatter tummy than before. You can expect to wear, with little exception, what you wore before surgery ... and this in-cludes tight clothing  and bathing suits.   Will you smell?  Today, thanks to modern odor proof pouching systems, you can walk into an ostomy support group meeting and not smell anything that is foul or offensive. And, for those with an ileostomy or colostomy who are concerned about odor when emptying their pouch, there are in-pouch deodorants that can be used to eliminate any waste odors that may exist.   Will you make noises?  Everyone produces gas, especially if they are an air-swallower. But intestinal sounds that occur from time to time are no differ-ent than a gurgling tummy, and quite often your clothing will muffle any sounds.   Will you feel the waste discharges?  For those with a colostomy or ileostomy there might be a slight pressure when waste leaves your body, but understand that the intestines have no nerve endings, so there will be no unpleasant sensations. Those with a urostomy will probably be unaware of any kidney drainage.   Will you be a captive of the toilet?  Immediately post-op you will spend more time in the bathroom than you will after your body recovers from surgery. Every person is different, but on average those with an ileostomy or urostomy may empty their pouches 4 to 6 times a day; a little  less if you have a colostomy. The average wear time between pouch system changes is 3 to 5 days and the changing process should take less than 30 minutes.   Will I need to be on a special diet? Most people return to their normal diet when they have recovered from surgery. Be sure to chew your food well, eat a well-balanced diet and drink plenty of fluids. If you experience problems with a certain food, wait a couple of weeks and try it again.  Will there be odor and noises? Pouching systems are designed to be odor-proof or odor-resistant. There are deodorants that can be used in the pouch. Medications are also available to help reduce odor. Limit gas-producing foods and carbonated beverages. You will  experience less gas and fewer noises as you heal from surgery.  How much time will it take to care for my ostomy? At first, you may spend a lot of time learning about your ostomy and how to take care of it. As you become more comfortable and skilled at changing the pouching system, it will take very little time to care for it.   Will I be able to return to work? People with ostomies can perform most jobs. As soon as you have healed from surgery, you should be able to return to work. Heavy lifting (more than 10 pounds) may be discouraged.   What about intimacy? Sexual relationships and intimacy are important and fulfilling aspects of your life. They should continue after ostomy surgery. Intimacy-related  concerns should be discussed openly between you and your partner.   Can I wear regular clothing? You do not need to wear special clothing. Ostomy pouches are fairly flat and barely noticeable. Elastic undergarments will not hurt the stoma or prevent the ostomy from functioning.   Can I participate in sports? An ostomy should not limit your involvement in sports. Many people with ostomies are runners, skiers, swimmers or participate in other active lifestyles. Talk with your caregiver first before doing heavy physical activity.  Will you starve?  Not if you follow doctor's orders at each stage of your post-op adjustment. There is no such thing as an "ostomy diet". Some people with an ostomy will be able to eat and tolerate anything; others may find diffi-culty with some foods. Each person is an individual and must determine, by trial, what is best for them. A good practice for all is to drink plenty of water.   Will you be a social outcast?  Have you met anyone who has an ostomy and is a social outcast? Why should you be the first? Only your attitude and self image will effect how you are treated. No confi-dent person is an Occupational psychologist.    PROFESSIONAL HELP   Resources are available if you need  help or have questions about your ostomy.   Specially trained nurses called Wound, Ostomy Continence Nurses (WOCN) are available for consultation in most major medical centers.  Consider getting an ostomy consult at one of the outpatient ostomy clinics.    The [employer] (UOA) is a group made up of many local chapters throughout the [place]. These local groups hold meetings and provide support to prospective and existing ostomates. They sponsor educational events and have qualified visitors to make personal or telephone visits. Contact the UOA for the chapter nearest you and for other educational publications.  More detailed information can be found in Colostomy Guide, a publication of the [employer] (UOA). Contact UOA at 1-(972)328-0032 or visit their web site at [email or web address].[email or web address]/. The website contains links to other sites, suppliers and resources.  Tree surgeon Start Services: Start at the website to enlist for support.  Your Wound Ostomy (WOCN) nurse may have started this process. https://www.hollister.com/en/securestart Secure Start services are designed to support people as they live their lives with an ostomy or neurogenic bladder. Enrolling is easy and at no cost to the patient. We realize that each person's needs and life journey are different. Through Secure Start services, we want to help people live their life, their way.

## 2021-06-24 NOTE — Interval H&P Note (Signed)
History and Physical Interval Note:  06/24/2021 9:30 AM  Travis Calderon  has presented today for surgery, with the diagnosis of sigmoid colectomy.  The various methods of treatment have been discussed with the patient and family. After consideration of risks, benefits and other options for treatment, the patient has consented to  Procedure(s): COLECTOMY WITH COLOSTOMY CREATION/HARTMANN PROCEDURE, SIGMOID (N/A) as a surgical intervention.  The patient's history has been reviewed, patient examined, no change in status, stable for surgery.  I have reviewed the patient's chart and labs.  Questions were answered to the patient's satisfaction.     Fritzi Mandes

## 2021-06-24 NOTE — Op Note (Signed)
Date: 06/24/21  Patient: Travis Calderon MRN: [id number]  Preoperative Diagnosis: Obstructive sigmoid colon mass Postoperative Diagnosis: Same  Procedure: Open sigmoid colectomy with end colostomy  Surgeon: Sophronia Simas, MD Assistant: Karie Soda, MD  EBL: 100 mL  Anesthesia: General endotracheal  Specimens: Left colon, stitch marks proximal margin   Indications: Mr. Loewe is a 65 yo male who presented with worsening obstipation and abdominal distension. CT scan showed an obstructive mass in the sigmoid colon with proximal colonic distension. Surgical resection was recommended for relief of the obstruction and the patient was brought to the operating room.  Findings: Palpable firm mass in the sigmoid colon, highly suspicious for malignancy, with proximal colonic and small bowel distension consistent with obstruction. All of the colon and small bowel were viable without perforation. No evidence of metastatic disease within the abdomen. Descending and sigmoid resection were performed with creation of an end colostomy.  Procedure details: Informed consent was obtained in the preoperative area prior to the procedure. The patient was brought to the operating room and placed on the table in the supine position. General anesthesia was induced and appropriate lines and drains were placed for intraoperative monitoring. Perioperative antibiotics were administered per SCIP guidelines. The abdomen was prepped and draped in the usual sterile fashion. A pre-procedure timeout was taken verifying patient identity, surgical site and procedure to be performed.  A lower midline skin incision was made and the subcutaneous tissue was divided with cautery. The fascia was opened at the linea alba along the length of the incision. There was a fat-containing supraumbilical hernia. A Bookwalter fixed retractor was placed. The small bowel, ascending colon, and transverse colon were diffusely dilated, consistent  with obstruction. The small bowel was eviscerated to allow exposure of the descending colon. A large firm mass was palpable in the sigmoid colon, but the mass did not appear to invade any other structures. The sigmoid and descending colon were mobilized in a lateral to medial fashion by incising the white line of Toldt with cautery. Once the left colon was mobilized this allowed better visualization of the mass. A mesenteric window was created in the distal colon just proximal to the rectosigmoid junction, and the colon was then transected at this point using a TA-90 stapler with a blue load.The sigmoid mesentery was then divided with ligasure, taking as much mesentery and lymphatic tissue as possible with the specimen. The SMA was ligated as high as possible however this was a difficult dissection due to body habitus and thickened and relatively shortened mesentery.   Prior to the proximal division of the colon, we elected to decompress the colon as much as possible. A pursestring suture was placed in the descending colon proximal to the mass, and a colotomy was created in the center of the pursestring. Pool suction was then used to decompress the colon via the colotomy. The suction was removed and the pursestring tied down. A mesenteric window was created in the mid-descending colon proximal to the colotomy, and the colon was transected at this point using a TA-90 blue stapler. This left a proximal margin of greater than 5 cm beyond the mass. The remaining mesentery was divided and the specimen was sent for pathology. The wound was irrigated and hemostasis was achieved. At this point the rectal stump was noted to be leaking stool. It was oversewn in 2 layers with a running V-loc followed by a running 0 Prolene suture. The ends of the prolene were left long to tag the location of  the rectal stump. The abdomen was then copiously irrigated with warmed saline. The splenic flexure was mobilized to allow the colostomy  to reach the abdominal wall without tension. A location for the colostomy was selected on the LUQ abdominal wall overlying the rectus muscle. A circular skin incision was made and the subcutaneous tissue was excised with cautery down to the fascia. The anterior rectus sheath was transversely incised, the rectus muscle was spread, and a vertical incision was made in the posterior rectus sheath. The fascial defect easily accommodated two finger-breadths. The stapled end of the descending colon was passed through the abdominal wall defect and secured with a babcock clamp, taking care not to twist the mesentery. A 19-Fr round blake drain was then placed adjacent to the rectal stump and brought out through the RLQ abdominal wall. It was secured to the skin with a 2-0 prolene suture. The fascia was then closed with a running looped 1 PDS suture. The skin was left open and a wound vac was placed. The colostomy was then matured in M[place] fashion using 2-0 Vicryl sutures. A 28-Fr malecot was placed into the stoma to promote ongoing decompression of the colon.  The patient tolerated the procedure with no apparent complications. All counts were correct x2 at the end of the procedure. The patient was extubated and taken to PACU in stable condition.  Sophronia Simas, MD 06/24/21 1:00 PM

## 2021-06-24 NOTE — Anesthesia Procedure Notes (Signed)
Procedure Name: Intubation Date/Time: 06/24/2021 10:46 AM Performed by: Wynonia Sours, CRNA Pre-anesthesia Checklist: Patient identified, Emergency Drugs available, Suction available, Patient being monitored and Timeout performed Patient Re-evaluated:Patient Re-evaluated prior to induction Oxygen Delivery Method: Circle system utilized Preoxygenation: Pre-oxygenation with 100% oxygen Induction Type: IV induction and Rapid sequence Laryngoscope Size: 3 and Glidescope Grade View: Grade I Tube type: Oral Tube size: 7.5 mm Number of attempts: 1 Airway Equipment and Method: Rigid stylet and Video-laryngoscopy Placement Confirmation: ETT inserted through vocal cords under direct vision, positive ETCO2, CO2 detector and breath sounds checked- equal and bilateral Secured at: 22 cm Tube secured with: Tape Dental Injury: Teeth and Oropharynx as per pre-operative assessment  Difficulty Due To: Difficulty was anticipated and Difficult Airway- due to immobile epiglottis

## 2021-06-24 NOTE — Progress Notes (Signed)
Day of Surgery  Subjective: No acute changes. Patient denies nausea/vomiting. Afebrile, vitals stable. NG tube placement not successful last night and patient declined further attempts.   Objective: Vital signs in last 24 hours: Temp:  [98.4 F (36.9 C)-98.8 F (37.1 C)] 98.7 F (37.1 C) (07/09 0454) Pulse Rate:  [89-115] 97 (07/09 0454) Resp:  [12-20] 18 (07/09 0454) BP: (105-132)/(65-93) 110/75 (07/09 0454) SpO2:  [90 %-97 %] 94 % (07/09 0454) Weight:  [144.5 kg-145.2 kg] 144.5 kg (07/08 2251) Last BM Date: 06/21/21  Intake/Output from previous day: 07/08 0701 - 07/09 0700 In: 519.4 [P.O.:120; I.V.:399.4] Out: 100 [Urine:100] Intake/Output this shift: No intake/output data recorded.  PE: General: resting comfortably, NAD Neuro: alert and oriented, no focal deficits Resp: normal work of breathing  Abdomen: distended but compressible, mildly tender, improved from prior exam. Well-healed midline scar. Extremities: warm and well-perfused   Lab Results:  Recent Labs    06/23/21 1109 06/24/21 0247  WBC 12.4* 11.0*  HGB 16.7 14.8  HCT 48.2 44.9  PLT 277 238   BMET Recent Labs    06/23/21 1109 06/24/21 0247  NA 138 141  K 3.4* 3.3*  CL 105 106  CO2 25 25  GLUCOSE 115* 95  BUN 13 19  CREATININE 0.94 1.01  CALCIUM 9.0 8.3*   PT/INR Recent Labs    06/24/21 0247  LABPROT 14.2  INR 1.1   CMP     Component Value Date/Time   NA 141 06/24/2021 0247   K 3.3 (L) 06/24/2021 0247   CL 106 06/24/2021 0247   CO2 25 06/24/2021 0247   GLUCOSE 95 06/24/2021 0247   BUN 19 06/24/2021 0247   CREATININE 1.01 06/24/2021 0247   CALCIUM 8.3 (L) 06/24/2021 0247   PROT 7.4 06/24/2021 0247   ALBUMIN 3.1 (L) 06/24/2021 0247   AST 12 (L) 06/24/2021 0247   ALT 11 06/24/2021 0247   ALKPHOS 69 06/24/2021 0247   BILITOT 1.3 (H) 06/24/2021 0247   GFRNONAA >60 06/24/2021 0247   Lipase     Component Value Date/Time   LIPASE 22 06/23/2021 1109        Studies/Results: CT ABDOMEN PELVIS W CONTRAST  Result Date: 06/23/2021 CLINICAL DATA:  Abdominal distension, constipation EXAM: CT ABDOMEN AND PELVIS WITH CONTRAST TECHNIQUE: Multidetector CT imaging of the abdomen and pelvis was performed using the standard protocol following bolus administration of intravenous contrast. CONTRAST:  OMNIPAQUE IOHEXOL 300 MG/ML  SOLN COMPARISON:  None. FINDINGS: Lower chest: The included lung bases are clear. Heart size within normal limits. Hepatobiliary: No focal liver abnormality is seen. No gallstones, gallbladder wall thickening, or biliary dilatation. Pancreas: Unremarkable. No pancreatic ductal dilatation or surrounding inflammatory changes. Spleen: Normal in size without focal abnormality. Adrenals/Urinary Tract: Unremarkable adrenal glands. No adrenal nodules or masses. Simple 1.6 cm lower pole right renal cyst. Simple 1.3 cm cyst within the interpolar region of the left kidney. No renal stone or hydronephrosis. Urinary bladder is incompletely distended, limiting its evaluation. Stomach/Bowel: Large mass within the proximal sigmoid colon with approximate measurements of 8.6 x 3.8 x 3.6 cm (series 2, images 65-77). Multiple small adjacent pericolonic lymph nodes concerning for metastatic disease. Mass completely occupies the colonic lumen at this location. There is associated obstruction of large and small bowel upstream from this level. Fecalization of small bowel content. No pneumatosis is seen. An air-filled appendix is present. Stomach is mildly distended but otherwise within normal limits. Vascular/Lymphatic: No vascular abnormality is identified. Scattered nonenlarged  retroperitoneal lymph nodes are nonspecific. Reproductive: Mildly enlarged prostate gland. Other: No free fluid. No abdominopelvic fluid collection. No pneumoperitoneum. No abdominal wall hernia. Musculoskeletal: No acute osseous abnormality. No suspicious bone lesion. Severe  osteoarthritis of the right hip. IMPRESSION: 1. Large obstructing colonic mass within the proximal sigmoid colon measuring up to 8.6 cm. There is associated mechanical obstruction of the large and small bowel upstream from this mass. Surgical evaluation is recommended. 2. Multiple mildly prominent pericolonic lymph nodes concerning for metastatic disease. Scattered nonenlarged retroperitoneal lymph nodes are nonspecific. 3. Mildly enlarged prostate gland. These results were called by telephone at the time of interpretation on 06/23/2021 at 1:04 pm to provider [facility], [facility] , who verbally acknowledged these results. Electronically Signed   By: Duanne Guess D.O.   On: 06/23/2021 13:09   DG Chest Portable 1 View  Result Date: 06/23/2021 CLINICAL DATA:  Edema. EXAM: PORTABLE CHEST 1 VIEW COMPARISON:  April 07, 2016. FINDINGS: Stable cardiomegaly. Both lungs are clear. The visualized skeletal structures are unremarkable. IMPRESSION: No active disease. Electronically Signed   By: Lupita Raider M.D.   On: 06/23/2021 12:55    Anti-infectives: Anti-infectives (From admission, onward)    Start     Dose/Rate Route Frequency Ordered Stop   06/24/21 0830  cefoTEtan (CEFOTAN) 2 g in sodium chloride 0.9 % 100 mL IVPB        2 g 200 mL/hr over 30 Minutes Intravenous On call to O.R. 06/24/21 7096 06/25/21 0559        Assessment/Plan  65 yo male with large bowel obstruction secondary to a sigmoid mass. - OR today for open sigmoid colectomy and end colostomy. I again discussed the procedure details with the patient and his family at bedside this morning. They understand this is highly suspicious for a malignancy. - Unable to bowel prep due to obstruction - Periop abx: Cefotan - NPO, IV fluid hydration  - VTE: preop lovenox ordered - Dispo: inpatient, OR this morning   LOS: 1 day    Sophronia Simas, MD [facility], Hepatobiliary and Pancreatic Surgery 06/24/21 8:26 AM

## 2021-06-24 NOTE — Addendum Note (Signed)
Addendum  created 06/24/21 1610 by Sudie Grumbling, CRNA   Charge Capture section accepted, Visit diagnoses modified

## 2021-06-24 NOTE — Anesthesia Postprocedure Evaluation (Signed)
Anesthesia Post Note  Patient: Travis Calderon  Procedure(s) Performed: COLECTOMY WITH COLOSTOMY CREATION/HARTMANN PROCEDURE, SIGMOID (Abdomen)     Patient location during evaluation: PACU Anesthesia Type: General Level of consciousness: awake and alert Pain management: pain level controlled Vital Signs Assessment: post-procedure vital signs reviewed and stable Respiratory status: spontaneous breathing, nonlabored ventilation, respiratory function stable and patient connected to nasal cannula oxygen Cardiovascular status: blood pressure returned to baseline, stable and tachycardic Postop Assessment: no apparent nausea or vomiting Anesthetic complications: no   No notable events documented.  Last Vitals:  Vitals:   06/24/21 1420 06/24/21 1430  BP: 116/82 114/88  Pulse: (!) 103 93  Resp: (!) 22 19  Temp:  36.6 C  SpO2: 93% 91%    Last Pain:  Vitals:   06/24/21 1430  TempSrc:   PainSc: 4                  Beryle Lathe

## 2021-06-24 NOTE — Transfer of Care (Signed)
Immediate Anesthesia Transfer of Care Note  Patient: Travis Calderon  Procedure(s) Performed: COLECTOMY WITH COLOSTOMY CREATION/HARTMANN PROCEDURE, SIGMOID (Abdomen)  Patient Location: PACU  Anesthesia Type:General  Level of Consciousness: awake, alert  and oriented  Airway & Oxygen Therapy: Patient Spontanous Breathing and Patient connected to face mask  Post-op Assessment: Report given to RN and Post -op Vital signs reviewed and stable  Post vital signs: Reviewed and stable  Last Vitals:  Vitals Value Taken Time  BP 109/83 06/24/21 1345  Temp 36.4 C 06/24/21 1335  Pulse 100 06/24/21 1348  Resp 22 06/24/21 1348  SpO2 94 % 06/24/21 1348  Vitals shown include unvalidated device data.  Last Pain:  Vitals:   06/24/21 1340  TempSrc:   PainSc: 9          Complications: No notable events documented.

## 2021-06-24 NOTE — H&P (View-Only) (Signed)
Day of Surgery  Subjective: No acute changes. Patient denies nausea/vomiting. Afebrile, vitals stable. NG tube placement not successful last night and patient declined further attempts.   Objective: Vital signs in last 24 hours: Temp:  [98.4 F (36.9 C)-98.8 F (37.1 C)] 98.7 F (37.1 C) (07/09 0454) Pulse Rate:  [89-115] 97 (07/09 0454) Resp:  [12-20] 18 (07/09 0454) BP: (105-132)/(65-93) 110/75 (07/09 0454) SpO2:  [90 %-97 %] 94 % (07/09 0454) Weight:  [144.5 kg-145.2 kg] 144.5 kg (07/08 2251) Last BM Date: 06/21/21  Intake/Output from previous day: 07/08 0701 - 07/09 0700 In: 519.4 [P.O.:120; I.V.:399.4] Out: 100 [Urine:100] Intake/Output this shift: No intake/output data recorded.  PE: General: resting comfortably, NAD Neuro: alert and oriented, no focal deficits Resp: normal work of breathing  Abdomen: distended but compressible, mildly tender, improved from prior exam. Well-healed midline scar. Extremities: warm and well-perfused   Lab Results:  Recent Labs    06/23/21 1109 06/24/21 0247  WBC 12.4* 11.0*  HGB 16.7 14.8  HCT 48.2 44.9  PLT 277 238   BMET Recent Labs    06/23/21 1109 06/24/21 0247  NA 138 141  K 3.4* 3.3*  CL 105 106  CO2 25 25  GLUCOSE 115* 95  BUN 13 19  CREATININE 0.94 1.01  CALCIUM 9.0 8.3*   PT/INR Recent Labs    06/24/21 0247  LABPROT 14.2  INR 1.1   CMP     Component Value Date/Time   NA 141 06/24/2021 0247   K 3.3 (L) 06/24/2021 0247   CL 106 06/24/2021 0247   CO2 25 06/24/2021 0247   GLUCOSE 95 06/24/2021 0247   BUN 19 06/24/2021 0247   CREATININE 1.01 06/24/2021 0247   CALCIUM 8.3 (L) 06/24/2021 0247   PROT 7.4 06/24/2021 0247   ALBUMIN 3.1 (L) 06/24/2021 0247   AST 12 (L) 06/24/2021 0247   ALT 11 06/24/2021 0247   ALKPHOS 69 06/24/2021 0247   BILITOT 1.3 (H) 06/24/2021 0247   GFRNONAA >60 06/24/2021 0247   Lipase     Component Value Date/Time   LIPASE 22 06/23/2021 1109        Studies/Results: CT ABDOMEN PELVIS W CONTRAST  Result Date: 06/23/2021 CLINICAL DATA:  Abdominal distension, constipation EXAM: CT ABDOMEN AND PELVIS WITH CONTRAST TECHNIQUE: Multidetector CT imaging of the abdomen and pelvis was performed using the standard protocol following bolus administration of intravenous contrast. CONTRAST:  OMNIPAQUE IOHEXOL 300 MG/ML  SOLN COMPARISON:  None. FINDINGS: Lower chest: The included lung bases are clear. Heart size within normal limits. Hepatobiliary: No focal liver abnormality is seen. No gallstones, gallbladder wall thickening, or biliary dilatation. Pancreas: Unremarkable. No pancreatic ductal dilatation or surrounding inflammatory changes. Spleen: Normal in size without focal abnormality. Adrenals/Urinary Tract: Unremarkable adrenal glands. No adrenal nodules or masses. Simple 1.6 cm lower pole right renal cyst. Simple 1.3 cm cyst within the interpolar region of the left kidney. No renal stone or hydronephrosis. Urinary bladder is incompletely distended, limiting its evaluation. Stomach/Bowel: Large mass within the proximal sigmoid colon with approximate measurements of 8.6 x 3.8 x 3.6 cm (series 2, images 65-77). Multiple small adjacent pericolonic lymph nodes concerning for metastatic disease. Mass completely occupies the colonic lumen at this location. There is associated obstruction of large and small bowel upstream from this level. Fecalization of small bowel content. No pneumatosis is seen. An air-filled appendix is present. Stomach is mildly distended but otherwise within normal limits. Vascular/Lymphatic: No vascular abnormality is identified. Scattered nonenlarged  retroperitoneal lymph nodes are nonspecific. Reproductive: Mildly enlarged prostate gland. Other: No free fluid. No abdominopelvic fluid collection. No pneumoperitoneum. No abdominal wall hernia. Musculoskeletal: No acute osseous abnormality. No suspicious bone lesion. Severe  osteoarthritis of the right hip. IMPRESSION: 1. Large obstructing colonic mass within the proximal sigmoid colon measuring up to 8.6 cm. There is associated mechanical obstruction of the large and small bowel upstream from this mass. Surgical evaluation is recommended. 2. Multiple mildly prominent pericolonic lymph nodes concerning for metastatic disease. Scattered nonenlarged retroperitoneal lymph nodes are nonspecific. 3. Mildly enlarged prostate gland. These results were called by telephone at the time of interpretation on 06/23/2021 at 1:04 pm to provider CAROLINE ABERMAN, PA , who verbally acknowledged these results. Electronically Signed   By: Nicholas  Plundo D.O.   On: 06/23/2021 13:09   DG Chest Portable 1 View  Result Date: 06/23/2021 CLINICAL DATA:  Edema. EXAM: PORTABLE CHEST 1 VIEW COMPARISON:  April 07, 2016. FINDINGS: Stable cardiomegaly. Both lungs are clear. The visualized skeletal structures are unremarkable. IMPRESSION: No active disease. Electronically Signed   By: James  Green Jr M.D.   On: 06/23/2021 12:55    Anti-infectives: Anti-infectives (From admission, onward)    Start     Dose/Rate Route Frequency Ordered Stop   06/24/21 0830  cefoTEtan (CEFOTAN) 2 g in sodium chloride 0.9 % 100 mL IVPB        2 g 200 mL/hr over 30 Minutes Intravenous On call to O.R. 06/24/21 0733 06/25/21 0559        Assessment/Plan  65 yo male with large bowel obstruction secondary to a sigmoid mass. - OR today for open sigmoid colectomy and end colostomy. I again discussed the procedure details with the patient and his family at bedside this morning. They understand this is highly suspicious for a malignancy. - Unable to bowel prep due to obstruction - Periop abx: Cefotan - NPO, IV fluid hydration  - VTE: preop lovenox ordered - Dispo: inpatient, OR this morning   LOS: 1 day    Lener Ventresca, MD [facility], Hepatobiliary and Pancreatic Surgery 06/24/21 8:26 AM  

## 2021-06-24 NOTE — Anesthesia Preprocedure Evaluation (Addendum)
Anesthesia Evaluation  Patient identified by MRN, date of birth, ID band Patient awake    Reviewed: Allergy & Precautions, NPO status , Patient's Chart, lab work & pertinent test results  History of Anesthesia Complications Negative for: history of anesthetic complications  Airway Mallampati: II  TM Distance: >3 FB Neck ROM: Full    Dental  (+) Dental Advisory Given   Pulmonary neg pulmonary ROS,    Pulmonary exam normal        Cardiovascular  Rhythm:Regular Rate:Tachycardia   Likely undiagnosed/untreated HTN    Neuro/Psych negative neurological ROS  negative psych ROS   GI/Hepatic Neg liver ROS,  Colon mass    Endo/Other  Morbid obesity K 3.3   Renal/GU negative Renal ROS     Musculoskeletal negative musculoskeletal ROS (+)   Abdominal (+) + obese,   Peds  Hematology negative hematology ROS (+)   Anesthesia Other Findings Covid+ - asymptomatic, benign CXR   Reproductive/Obstetrics                            Anesthesia Physical Anesthesia Plan  ASA: 3  Anesthesia Plan: General   Post-op Pain Management:    Induction: Intravenous and Rapid sequence  PONV Risk Score and Plan: 4 or greater and Treatment may vary due to age or medical condition, Ondansetron and Dexamethasone  Airway Management Planned: Oral ETT and Video Laryngoscope Planned  Additional Equipment: None  Intra-op Plan:   Post-operative Plan: Extubation in OR  Informed Consent: I have reviewed the patients History and Physical, chart, labs and discussed the procedure including the risks, benefits and alternatives for the proposed anesthesia with the patient or authorized representative who has indicated his/her understanding and acceptance.     Dental advisory given  Plan Discussed with: CRNA and Anesthesiologist  Anesthesia Plan Comments:        Anesthesia Quick Evaluation

## 2021-06-24 NOTE — Consult Note (Signed)
WOC Nurse Consult Note: Reason for Consult: New colostomy and midline NPWT.  WOC Nursing team is in receipt of consult request.  Will see Monday, 7/11 for NPWT dressing change and begin following for colostomy care and education.  WOC nursing team will follow, and will remain available to this patient, the nursing and medical teams.    Ladona Mow, MSN, RN, GNP, [place] [facility]  Pager# [phone]

## 2021-06-24 NOTE — Progress Notes (Signed)
PROGRESS NOTE    Travis Calderon  MLY:[id number] DOB: 1956/10/07 DOA: 06/23/2021 PCP: [facility]     Brief Narrative: This 65 years old male with PMH significant for obesity, diverticulosis presented to the ED with complaints of abdominal pain and abnormal bowel movements.  Patient reports for the last several days he has had generalized abdominal pain associated with distention, nausea but denies any vomiting.  He also report over the past several months, he has had abnormal bowel movement,  he describes it as more frequent and does not appear same.  He regularly follows up with PCP,  does not take any take medications routinely. [place]  He is found to be COVID-positive, CT abdomen shows large obstructing colonic mass.  Patient was started on pain medication.  General surgery was consulted. Patient is transferred to [facility].  and patient underwent Open sigmoid colectomy with end colostomy.  Assessment & Plan:   Active Problems:   Colonic mass   Morbid obesity (HCC)   Hypokalemia   COVID-19 virus infection  Abdominal pain with irregular bowel movement.: Patient presented with constant abdominal pain, distention and irregular bowel movement for several months. CT abdomen pelvis showed large obstructing colonic mass. He reported having colonoscopy in [place],  he does not know the results. General surgery was consulted.  Patient was admitted at [facility]. Patient underwent open sigmoid colectomy with end colostomy 06/24/21 Continue to follow clinical status, Adequate pain control.  COVID+: Patient is found positive, chest x-ray unremarkable. Patient is asymptomatic at this time. No need to treat with remdesivir or Solu-Medrol.  Hypokalemia: replaced continue to monitor  Morbid obesity:  recommend diet and weight loss.   DVT prophylaxis:  SCDs Code Status: Full code. Family Communication:  Sisters at bed side. Disposition Plan:   Status is: Inpatient  Remains inpatient appropriate because:Inpatient level of care appropriate due to severity of illness  Dispo: The patient is from: Home              Anticipated d/c is to: Home              Patient currently is not medically stable to d/c.   Difficult to place patient No   Consultants:  General surgery  Procedures: Open sigmoid colectomy with end colostomy Antimicrobials:  Anti-infectives (From admission, onward)    Start     Dose/Rate Route Frequency Ordered Stop   06/24/21 0830  cefoTEtan (CEFOTAN) 2 g in sodium chloride 0.9 % 100 mL IVPB        2 g 200 mL/hr over 30 Minutes Intravenous On call to O.R. 06/24/21 8127 06/24/21 1117        Subjective: Patient was seen and examined at bedside.  Overnight events noted.  Patient reports Abdominal pain, denies any nausea and vomiting.  Patient is admitted to have surgery.  Objective: Vitals:   06/24/21 1335 06/24/21 1340 06/24/21 1345 06/24/21 1350  BP: 111/73  109/83 113/82  Pulse: 87 95 95 98  Resp: 18 13 15 19   Temp: (!) 97.5 F (36.4 C)     TempSrc:      SpO2: 92% (!) 85% 100% 96%  Weight:      Height:        Intake/Output Summary (Last 24 hours) at 06/24/2021 1401 Last data filed at 06/24/2021 1335 Gross per 24 hour  Intake 2119.43 ml  Output 400 ml  Net 1719.43 ml   Filed Weights   06/23/21 1059 06/23/21 2251  Weight: (!) 145.2 kg (!) 144.5 kg    Examination:  General exam: Appears calm and comfortable , not in any acute distress Respiratory system: Clear to auscultation. Respiratory effort normal. Cardiovascular system: S1 & S2 heard, RRR. No JVD, murmurs, rubs, gallops or clicks. No pedal edema. Gastrointestinal system: Abdomen is distended, soft and tender. No organomegaly or masses felt. Normal bowel sounds heard.  No guarding, no rigidity. Central nervous system: Alert and oriented. No focal neurological deficits. Extremities: Symmetric 5  x 5 power. Skin: No rashes, lesions or ulcers Psychiatry: Judgement and insight appear normal. Mood & affect appropriate.     Data Reviewed: I have personally reviewed following labs and imaging studies  CBC: Recent Labs  Lab 06/23/21 1109 06/24/21 0247  WBC 12.4* 11.0*  HGB 16.7 14.8  HCT 48.2 44.9  MCV 87.6 89.8  PLT 277 238   Basic Metabolic Panel: Recent Labs  Lab 06/23/21 1109 06/24/21 0247  NA 138 141  K 3.4* 3.3*  CL 105 106  CO2 25 25  GLUCOSE 115* 95  BUN 13 19  CREATININE 0.94 1.01  CALCIUM 9.0 8.3*  MG  --  2.1  PHOS  --  3.8   GFR: Estimated Creatinine Clearance: 100.6 mL/min (by C-G formula based on SCr of 1.01 mg/dL). Liver Function Tests: Recent Labs  Lab 06/23/21 1109 06/24/21 0247  AST 18 12*  ALT 10 11  ALKPHOS 86 69  BILITOT 0.8 1.3*  PROT 8.9* 7.4  ALBUMIN 3.6 3.1*   Recent Labs  Lab 06/23/21 1109  LIPASE 22   No results for input(s): AMMONIA in the last 168 hours. Coagulation Profile: Recent Labs  Lab 06/24/21 0247  INR 1.1   Cardiac Enzymes: No results for input(s): CKTOTAL, CKMB, CKMBINDEX, TROPONINI in the last 168 hours. BNP (last 3 results) No results for input(s): PROBNP in the last 8760 hours. HbA1C: Recent Labs    06/24/21 0247  HGBA1C 5.1   CBG: No results for input(s): GLUCAP in the last 168 hours. Lipid Profile: No results for input(s): CHOL, HDL, LDLCALC, TRIG, CHOLHDL, LDLDIRECT in the last 72 hours. Thyroid Function Tests: No results for input(s): TSH, T4TOTAL, FREET4, T3FREE, THYROIDAB in the last 72 hours. Anemia Panel: No results for input(s): VITAMINB12, FOLATE, FERRITIN, TIBC, IRON, RETICCTPCT in the last 72 hours. Sepsis Labs: No results for input(s): PROCALCITON, LATICACIDVEN in the last 168 hours.  Recent Results (from the past 240 hour(s))  Resp Panel by RT-PCR (Flu A&B, Covid) Nasopharyngeal Swab     Status: Abnormal   Collection Time: 06/23/21  1:12 PM   Specimen: Nasopharyngeal Swab;  Nasopharyngeal(NP) swabs in vial transport medium  Result Value Ref Range Status   SARS Coronavirus 2 by RT PCR POSITIVE (A) NEGATIVE Final    Comment: RESULT CALLED TO, READ BACK BY AND VERIFIED WITH:  COBLE,S RN @1403  06/23/21 EDENSCA (NOTE) SARS-CoV-2 target nucleic acids are DETECTED.  The SARS-CoV-2 RNA is generally detectable in upper respiratory specimens during the acute phase of infection. Positive results are indicative of the presence of the identified virus, but do not rule out bacterial infection or co-infection with other pathogens not detected by the test. Clinical correlation with patient history and other diagnostic information is necessary to determine patient infection status. The expected result is Negative.  Fact Sheet for Patients: BloggerCourse.comhttps://www.fda.gov/media/152166/download  Fact Sheet for Healthcare Providers: SeriousBroker.ithttps://www.fda.gov/media/152162/download  This test is not yet approved or  cleared by the [place] and  has been authorized for detection and/or diagnosis of SARS-CoV-2 by FDA under an Emergency Use Authorization (EUA).  This EUA will remain in effect (meaning this test can be  used) for the duration of  the COVID-19 declaration under Section 564(b)(1) of the Act, 21 U.S.C. section 360bbb-3(b)(1), unless the authorization is terminated or revoked sooner.     Influenza A by PCR NEGATIVE NEGATIVE Final   Influenza B by PCR NEGATIVE NEGATIVE Final    Comment: (NOTE) The Xpert Xpress SARS-CoV-2/FLU/RSV plus assay is intended as an aid in the diagnosis of influenza from Nasopharyngeal swab specimens and should not be used as a sole basis for treatment. Nasal washings and aspirates are unacceptable for Xpert Xpress SARS-CoV-2/FLU/RSV testing.  Fact Sheet for Patients: BloggerCourse.com  Fact Sheet for Healthcare Providers: SeriousBroker.it  This test is not yet approved or cleared by the  [place] FDA and has been authorized for detection and/or diagnosis of SARS-CoV-2 by FDA under an Emergency Use Authorization (EUA). This EUA will remain in effect (meaning this test can be used) for the duration of the COVID-19 declaration under Section 564(b)(1) of the Act, 21 U.S.C. section 360bbb-3(b)(1), unless the authorization is terminated or revoked.  Performed at [facility], [address] [place], [place] [address]   Surgical PCR screen     Status: Abnormal   Collection Time: 06/24/21  1:09 AM   Specimen: Nasal Mucosa; Nasal Swab  Result Value Ref Range Status   MRSA, PCR POSITIVE (A) NEGATIVE Final    Comment: RESULT CALLED TO, READ BACK BY AND VERIFIED WITH: SHAW,C RN @0333  ON 06/24/21 JACKSON,K    Staphylococcus aureus POSITIVE (A) NEGATIVE Final    Comment: (NOTE) The Xpert SA Assay (FDA approved for NASAL specimens in patients 7 years of age and older), is one component of a comprehensive surveillance program. It is not intended to diagnose infection nor to guide or monitor treatment. Performed at [facility], [address], [place], [place]     Radiology Studies: CT ABDOMEN PELVIS W CONTRAST  Result Date: 06/23/2021 CLINICAL DATA:  Abdominal distension, constipation EXAM: CT ABDOMEN AND PELVIS WITH CONTRAST TECHNIQUE: Multidetector CT imaging of the abdomen and pelvis was performed using the standard protocol following bolus administration of intravenous contrast. CONTRAST:  08/24/2021 OMNIPAQUE IOHEXOL 300 MG/ML  SOLN COMPARISON:  None. FINDINGS: Lower chest: The included lung bases are clear. Heart size within normal limits. Hepatobiliary: No focal liver abnormality is seen. No gallstones, gallbladder wall thickening, or biliary dilatation. Pancreas: Unremarkable. No pancreatic ductal dilatation or surrounding inflammatory changes. Spleen: Normal in size without focal abnormality. Adrenals/Urinary Tract: Unremarkable  adrenal glands. No adrenal nodules or masses. Simple 1.6 cm lower pole right renal cyst. Simple 1.3 cm cyst within the interpolar region of the left kidney. No renal stone or hydronephrosis. Urinary bladder is incompletely distended, limiting its evaluation. Stomach/Bowel: Large mass within the proximal sigmoid colon with approximate measurements of 8.6 x 3.8 x 3.6 cm (series 2, images 65-77). Multiple small adjacent pericolonic lymph nodes concerning for metastatic disease. Mass completely occupies the colonic lumen at this location. There is associated obstruction of large and small bowel upstream from this level. Fecalization of small bowel content. No pneumatosis is seen. An air-filled appendix is present. Stomach is mildly distended but otherwise within normal limits. Vascular/Lymphatic: No vascular abnormality is identified. Scattered nonenlarged retroperitoneal lymph nodes are nonspecific. Reproductive: Mildly enlarged prostate gland. Other:  No free fluid. No abdominopelvic fluid collection. No pneumoperitoneum. No abdominal wall hernia. Musculoskeletal: No acute osseous abnormality. No suspicious bone lesion. Severe osteoarthritis of the right hip. IMPRESSION: 1. Large obstructing colonic mass within the proximal sigmoid colon measuring up to 8.6 cm. There is associated mechanical obstruction of the large and small bowel upstream from this mass. Surgical evaluation is recommended. 2. Multiple mildly prominent pericolonic lymph nodes concerning for metastatic disease. Scattered nonenlarged retroperitoneal lymph nodes are nonspecific. 3. Mildly enlarged prostate gland. These results were called by telephone at the time of interpretation on 06/23/2021 at 1:04 pm to provider [facility] , who verbally acknowledged these results. Electronically Signed   By: Duanne Guess D.O.   On: 06/23/2021 13:09   DG Chest Portable 1 View  Result Date: 06/23/2021 CLINICAL DATA:  Edema. EXAM: PORTABLE CHEST 1 VIEW  COMPARISON:  April 07, 2016. FINDINGS: Stable cardiomegaly. Both lungs are clear. The visualized skeletal structures are unremarkable. IMPRESSION: No active disease. Electronically Signed   By: Lupita Raider M.D.   On: 06/23/2021 12:55    Scheduled Meds:  bupivacaine liposome  20 mL Infiltration Once   [MAR Hold] mupirocin ointment  1 application Nasal BID   Continuous Infusions:  lactated ringers 900 mL/hr at 06/24/21 1322     LOS: 1 day    Time spent: 35 mins    Tene Gato, MD [facility]   If 7PM-7AM, please contact night-coverage

## 2021-06-25 ENCOUNTER — Encounter (HOSPITAL_COMMUNITY): Payer: Self-pay | Admitting: Surgery

## 2021-06-25 DIAGNOSIS — K56609 Unspecified intestinal obstruction, unspecified as to partial versus complete obstruction: Secondary | ICD-10-CM | POA: Diagnosis not present

## 2021-06-25 LAB — URINALYSIS, COMPLETE (UACMP) WITH MICROSCOPIC
Glucose, UA: NEGATIVE mg/dL
Ketones, ur: NEGATIVE mg/dL
Nitrite: NEGATIVE
Protein, ur: 30 mg/dL — AB
RBC / HPF: >50 RBC/hpf — ABNORMAL HIGH (ref 0–5)
Specific Gravity, Urine: 1.021 (ref 1.005–1.030)
pH: 5 (ref 5.0–8.0)

## 2021-06-25 LAB — BASIC METABOLIC PANEL
Anion gap: 10 (ref 5–15)
BUN: 30 mg/dL — ABNORMAL HIGH (ref 8–23)
CO2: 25 mmol/L (ref 22–32)
Calcium: 7.7 mg/dL — ABNORMAL LOW (ref 8.9–10.3)
Chloride: 108 mmol/L (ref 98–111)
Creatinine, Ser: 2.43 mg/dL — ABNORMAL HIGH (ref 0.61–1.24)
GFR, Estimated: 29 mL/min — ABNORMAL LOW (ref >60)
Glucose, Bld: 127 mg/dL — ABNORMAL HIGH (ref 70–99)
Potassium: 3.9 mmol/L (ref 3.5–5.1)
Sodium: 143 mmol/L (ref 135–145)

## 2021-06-25 LAB — CBC
HCT: 44.3 % (ref 39.0–52.0)
Hemoglobin: 14.6 g/dL (ref 13.0–17.0)
MCH: 29.7 pg (ref 26.0–34.0)
MCHC: 33 g/dL (ref 30.0–36.0)
MCV: 90.2 fL (ref 80.0–100.0)
Platelets: 238 10*3/uL (ref 150–400)
RBC: 4.91 MIL/uL (ref 4.22–5.81)
RDW: 13.9 % (ref 11.5–15.5)
WBC: 7.5 10*3/uL (ref 4.0–10.5)
nRBC: 0 % (ref 0.0–0.2)

## 2021-06-25 LAB — PROTEIN / CREATININE RATIO, URINE
Creatinine, Urine: 325.5 mg/dL
Protein Creatinine Ratio: 0.28 mg/mg{Cre} — ABNORMAL HIGH (ref 0.00–0.15)
Total Protein, Urine: 90 mg/dL

## 2021-06-25 LAB — MAGNESIUM: Magnesium: 1.9 mg/dL (ref 1.7–2.4)

## 2021-06-25 LAB — PREALBUMIN: Prealbumin: 10.4 mg/dL — ABNORMAL LOW (ref 18–38)

## 2021-06-25 LAB — PHOSPHORUS: Phosphorus: 5.3 mg/dL — ABNORMAL HIGH (ref 2.5–4.6)

## 2021-06-25 MED ORDER — SODIUM CHLORIDE 0.9 % IV SOLN: INTRAVENOUS | Status: DC

## 2021-06-25 MED ORDER — SODIUM CHLORIDE 0.9 % IV BOLUS
1000.0000 mL | Freq: Once | INTRAVENOUS | Status: AC
  Administered 2021-06-25: 1000 mL via INTRAVENOUS

## 2021-06-25 MED ORDER — MIDODRINE HCL 5 MG PO TABS
5.0000 mg | ORAL_TABLET | Freq: Three times a day (TID) | ORAL | Status: DC
  Administered 2021-06-25 – 2021-06-26 (×4): 5 mg via ORAL
  Filled 2021-06-25 (×5): qty 1

## 2021-06-25 MED ORDER — SODIUM CHLORIDE 0.9 % IV BOLUS
500.0000 mL | Freq: Once | INTRAVENOUS | Status: AC
  Administered 2021-06-25: 500 mL via INTRAVENOUS

## 2021-06-25 NOTE — Progress Notes (Addendum)
PROGRESS NOTE    Travis Calderon  YQM:[id number] DOB: 07/23/1956 DOA: 06/23/2021 PCP: [facility]     Brief Narrative: This 65 years old male with PMH significant for obesity, diverticulosis presented to the ED with complaints of abdominal pain and abnormal bowel movements.  Patient reports for the last several days he has had generalized abdominal pain associated with distention, nausea but denies any vomiting.  He also report over the past several months, he has had abnormal bowel movement,  he describes it as more frequent and does not appear same.  He regularly follows up with PCP,  does not take any take medications routinely. [place]  He is found to be COVID-positive, CT abdomen shows large obstructing colonic mass.  Patient was started on pain medication.  General surgery was consulted. Patient is transferred to [facility] and patient underwent Open sigmoid colectomy with end colostomy.  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 Colonic Mass: Patient presented with constant abdominal pain, distention and irregular bowel movement for several months. CT abdomen pelvis showed large obstructing colonic mass. He reported having colon[place] in [place],  he does not know the results. General surgery was consulted.  Patient was admitted at [facility]. Patient underwent open sigmoid colectomy with end colostomy on 06/24/21 Postoperative course complicated by ileus,  recommended NG tube, IV hydration, pain control.. Continue colostomy care and training. Patient has incisional wound VAC, recheck daily.  COVID+: Patient is found positive, chest x-ray unremarkable. Patient is asymptomatic at this time. No need to treat with remdesivir or Solu-Medrol.  Acute kidney injury Likely prerenal: Serum creatinine up from 1.01> 2.43 likely prerenal postoperatively. Continue IV hydration, continue to monitor renal functions. Avoid Nephrotoxic medications, nephrology consulted. Urine output 70 cc in last 8 hours.  Hypokalemia: Replaced and resolved.  Morbid obesity:  recommend diet and weight loss.   DVT prophylaxis:  SCDs Code Status: Full code. Family Communication: Sisters at bed side. Disposition Plan:   Status is: Inpatient  Remains inpatient appropriate because:Inpatient level of care appropriate due to severity of illness  Dispo: The patient is from: Home              Anticipated d/c is to: Home              Patient currently is not medically stable to d/c.   Difficult to place patient No   Consultants:  General surgery  Procedures: Open sigmoid colectomy with end colostomy Antimicrobials:  Anti-infectives (From admission, onward)    Start     Dose/Rate Route Frequency Ordered Stop   06/24/21 2200  cefoTEtan (CEFOTAN) 2 g in sodium chloride 0.9 % 100 mL IVPB        2 g 200 mL/hr over 30 Minutes Intravenous Every 12 hours 06/24/21 1514 06/24/21 2109   06/24/21 0830  cefoTEtan (CEFOTAN) 2 g in sodium chloride 0.9 % 100 mL IVPB        2 g 200 mL/hr over 30  Minutes Intravenous On call to O.R. 06/24/21 3220 06/24/21 1117        Subjective: Patient was seen and examined at bedside.  Overnight events noted.   Patient reports having abdominal pain but denies any nausea and vomiting.   Blood pressure is improving.  He is s/p colectomy with colostomy.  Postoperative day 1.  Objective: Vitals:   06/25/21 0600 06/25/21 0700 06/25/21 0800  06/25/21 0900  BP: (!) 84/64 (!) 80/59 101/71 101/68  Pulse: (!) 105 (!) 103 100 (!) 109  Resp: 16 16 16 17   Temp:   98.4 F (36.9 C)   TempSrc:   Oral   SpO2: 90% 93% 93% 92%  Weight:      Height:        Intake/Output Summary (Last 24 hours) at 06/25/2021 1042 Last data filed at 06/25/2021 0900 Gross per 24 hour  Intake 5313.56 ml  Output 1772 ml  Net 3541.56 ml   Filed Weights   06/23/21 1059 06/23/21 2251 06/24/21 1500  Weight: (!) 145.2 kg (!) 144.5 kg (!) 144.5 kg    Examination:  General exam: Appears calm and comfortable , not in any acute distress. Respiratory system: Clear to auscultation. Respiratory effort normal. Cardiovascular system: S1 & S2 heard, RRR. No JVD, murmurs, rubs, gallops or clicks. No pedal edema. Gastrointestinal system: Abdomen is non distended, soft and tender.  Sluggish bowel sounds.  No guarding, no rigidity.  Wound VAC noted. Central nervous system: Alert and oriented. No focal neurological deficits. Extremities: Symmetric 5 x 5 power. Skin: No rashes, lesions or ulcers Psychiatry: Judgement and insight appear normal. Mood & affect appropriate.     Data Reviewed: I have personally reviewed following labs and imaging studies  CBC: Recent Labs  Lab 06/23/21 1109 06/24/21 0247 06/25/21 0226  WBC 12.4* 11.0* 7.5  HGB 16.7 14.8 14.6  HCT 48.2 44.9 44.3  MCV 87.6 89.8 90.2  PLT 277 238 238   Basic Metabolic Panel: Recent Labs  Lab 06/23/21 1109 06/24/21 0247 06/25/21 0226  NA 138 141 143  K 3.4* 3.3* 3.9  CL 105 106 108  CO2 25 25 25   GLUCOSE 115* 95 127*  BUN 13 19 30*  CREATININE 0.94 1.01 2.43*  CALCIUM 9.0 8.3* 7.7*  MG  --  2.1 1.9  PHOS  --  3.8 5.3*   GFR: Estimated Creatinine Clearance: 41.8 mL/min (A) (by C-G formula based on SCr of 2.43 mg/dL (H)). Liver Function Tests: Recent Labs  Lab 06/23/21 1109 06/24/21 0247  AST 18 12*  ALT 10 11  ALKPHOS 86 69  BILITOT 0.8 1.3*  PROT 8.9* 7.4  ALBUMIN 3.6 3.1*    Recent Labs  Lab 06/23/21 1109  LIPASE 22   No results for input(s): AMMONIA in the last 168 hours. Coagulation Profile: Recent Labs  Lab 06/24/21 0247  INR 1.1   Cardiac Enzymes: No results for input(s): CKTOTAL, CKMB, CKMBINDEX, TROPONINI in the last 168 hours. BNP (last 3 results) No results for input(s): PROBNP in the last 8760 hours. HbA1C: Recent Labs    06/24/21 0247  HGBA1C 5.1   CBG: No results for input(s): GLUCAP in the last 168 hours. Lipid Profile: No results for input(s): CHOL, HDL, LDLCALC, TRIG, CHOLHDL, LDLDIRECT in the last 72 hours. Thyroid Function Tests: No results for input(s): TSH, T4TOTAL, FREET4, T3FREE, THYROIDAB in the last 72 hours. Anemia Panel: No results for input(s): VITAMINB12, FOLATE, FERRITIN, TIBC, IRON, RETICCTPCT in the last 72 hours. Sepsis Labs: No  results for input(s): PROCALCITON, LATICACIDVEN in the last 168 hours.  Recent Results (from the past 240 hour(s))  Resp Panel by RT-PCR (Flu A&B, Covid) Nasopharyngeal Swab     Status: Abnormal   Collection Time: 06/23/21  1:12 PM   Specimen: Nasopharyngeal Swab; Nasopharyngeal(NP) swabs in vial transport medium  Result Value Ref Range Status   SARS Coronavirus 2 by RT PCR POSITIVE (A) NEGATIVE Final    Comment: RESULT CALLED TO, READ BACK BY AND VERIFIED WITH:  COBLE,S RN @1403  06/23/21 EDENSCA (NOTE) SARS-CoV-2 target nucleic acids are DETECTED.  The SARS-CoV-2 RNA is generally detectable in upper respiratory specimens during the acute phase of infection. Positive results are indicative of the presence of the identified virus, but do not rule out bacterial infection or co-infection with other pathogens not detected by the test. Clinical correlation with patient history and other diagnostic information is necessary to determine patient infection status. The expected result is Negative.  Fact Sheet for Patients: 08/24/21  Fact Sheet for  Healthcare Providers: BloggerCourse.com  This test is not yet approved or cleared by the [employer].[place] FDA and  has been authorized for detection and/or diagnosis of SARS-CoV-2 by FDA under an Emergency Use Authorization (EUA).  This EUA will remain in effect (meaning this test can be  used) for the duration of  the COVID-19 declaration under Section 564(b)(1) of the Act, 21 U.S.C. section 360bbb-3(b)(1), unless the authorization is terminated or revoked sooner.     Influenza A by PCR NEGATIVE NEGATIVE Final   Influenza B by PCR NEGATIVE NEGATIVE Final    Comment: (NOTE) The Xpert Xpress SARS-CoV-2/FLU/RSV plus assay is intended as an aid in the diagnosis of influenza from Nasopharyngeal swab specimens and should not be used as a sole basis for treatment. Nasal washings and aspirates are unacceptable for Xpert Xpress SARS-CoV-2/FLU/RSV testing.  Fact Sheet for Patients: [place]  Fact Sheet for Healthcare Providers: BloggerCourse.com  This test is not yet approved or cleared by the [employer]ious[employer] FDA and has been authorized for detection and/or diagnosis of SARS-CoV-2 by FDA under an Emergency Use Authorization (EUA). This EUA will remain in effect (meaning this test can be used) for the duration of the COVID-19 declaration under Section 564(b)(1) of the Act, 21 U.S.C. section 360bbb-3(b)(1), unless the authorization is terminated or revoked.  Performed at [facility], [address] [place], [place]   Surgical PCR screen     Status: Abnormal   Collection Time: 06/24/21  1:09 AM   Specimen: Nasal Mucosa; Nasal Swab  Result Value Ref Range Status   MRSA, PCR POSITIVE (A) NEGATIVE Final    Comment: RESULT CALLED TO, READ BACK BY AND VERIFIED WITH: SHAW,C RN @0333  ON 06/24/21 JACKSON,K    Staphylococcus aureus POSITIVE (A) NEGATIVE Final    Comment: (NOTE) The Xpert SA  Assay (FDA approved for NASAL specimens in patients 109 years of age and older), is one component of a comprehensive surveillance program. It is not intended to diagnose infection nor to guide or monitor treatment. Performed at [facility], [address], [place], [place]   MRSA Next Gen by PCR, Nasal     Status: Abnormal   Collection Time: 06/24/21  3:38 PM   Specimen: Nasal Mucosa; Nasal Swab  Result Value Ref Range Status   MRSA by PCR Next Gen DETECTED (A) NOT DETECTED Final    Comment: RESULT CALLED TO, READ BACK BY AND VERIFIED WITH: YOUNTS,D. RN  AT 1735 06/24/21 MULLINS,T (NOTE) The GeneXpert MRSA Assay (FDA approved for NASAL specimens only), is one component of a comprehensive MRSA colonization surveillance program. It is not intended to diagnose MRSA infection nor to guide or monitor treatment for MRSA infections. Test performance is not FDA approved in patients less than 502 years old. Performed at [facility], [address] [place], [place] [id number]     Radiology Studies: CT ABDOMEN PELVIS W CONTRAST  Result Date: 06/23/2021 CLINICAL DATA:  Abdominal distension, constipation EXAM: CT ABDOMEN AND PELVIS WITH CONTRAST TECHNIQUE: Multidetector CT imaging of the abdomen and pelvis was performed using the standard protocol following bolus administration of intravenous contrast. CONTRAST:  100mL OMNIPAQUE IOHEXOL 300 MG/ML  SOLN COMPARISON:  None. FINDINGS: Lower chest: The included lung bases are clear. Heart size within normal limits. Hepatobiliary: No focal liver abnormality is seen. No gallstones, gallbladder wall thickening, or biliary dilatation. Pancreas: Unremarkable. No pancreatic ductal dilatation or surrounding inflammatory changes. Spleen: Normal in size without focal abnormality. Adrenals/Urinary Tract: Unremarkable adrenal glands. No adrenal nodules or masses. Simple 1.6 cm lower pole right renal cyst. Simple 1.3 cm  cyst within the interpolar region of the left kidney. No renal stone or hydronephrosis. Urinary bladder is incompletely distended, limiting its evaluation. Stomach/Bowel: Large mass within the proximal sigmoid colon with approximate measurements of 8.6 x 3.8 x 3.6 cm (series 2, images 65-77). Multiple small adjacent pericolonic lymph nodes concerning for metastatic disease. Mass completely occupies the colonic lumen at this location. There is associated obstruction of large and small bowel upstream from this level. Fecalization of small bowel content. No pneumatosis is seen. An air-filled appendix is present. Stomach is mildly distended but otherwise within normal limits. Vascular/Lymphatic: No vascular abnormality is identified. Scattered nonenlarged retroperitoneal lymph nodes are nonspecific. Reproductive: Mildly enlarged prostate gland. Other: No free fluid. No abdominopelvic fluid collection. No pneumoperitoneum. No abdominal wall hernia. Musculoskeletal: No acute osseous abnormality. No suspicious bone lesion. Severe osteoarthritis of the right hip. IMPRESSION: 1. Large obstructing colonic mass within the proximal sigmoid colon measuring up to 8.6 cm. There is associated mechanical obstruction of the large and small bowel upstream from this mass. Surgical evaluation is recommended. 2. Multiple mildly prominent pericolonic lymph nodes concerning for metastatic disease. Scattered nonenlarged retroperitoneal lymph nodes are nonspecific. 3. Mildly enlarged prostate gland. These results were called by telephone at the time of interpretation on 06/23/2021 at 1:04 pm to provider [facility] , who verbally acknowledged these results. Electronically Signed   By: Duanne GuessNicholas  Plundo D.O.   On: 06/23/2021 13:09   DG Chest Portable 1 View  Result Date: 06/23/2021 CLINICAL DATA:  Edema. EXAM: PORTABLE CHEST 1 VIEW COMPARISON:  April 07, 2016. FINDINGS: Stable cardiomegaly. Both lungs are clear. The visualized  skeletal structures are unremarkable. IMPRESSION: No active disease. Electronically Signed   By: Lupita RaiderJames  Green Jr M.D.   On: 06/23/2021 12:55    Scheduled Meds:  chlorhexidine  15 mL Mouth Rinse BID   Chlorhexidine Gluconate Cloth  6 each Topical Daily   lip balm  1 application Topical BID   mouth rinse  15 mL Mouth Rinse BID   mupirocin ointment  1 application Nasal BID   Continuous Infusions:  lactated ringers 999 mL/hr at 06/25/21 0900   lactated ringers 50 mL/hr at 06/25/21 0900   methocarbamol (ROBAXIN) IV       LOS: 2 days    Time spent: 25 mins    Cipriano BunkerPARDEEP Aicia Babinski, MD [facility]   If 7PM-7AM, please contact night-coverage

## 2021-06-25 NOTE — Progress Notes (Signed)
Travis Calderon [id number] 08-06-1956  CARE TEAM:  [facility]P: [facility]  Outpatient Care Team: Patient Care Team: [facility] as PCP - General (General Practice) Waylan Boga, MD as Referring Physician (Orthopedic Surgery) Karie Soda, MD as Consulting Physician (Colon and Rectal Surgery)  Inpatient Treatment Team: Treatment Team: Attending Provider: Cipriano Bunker, MD; Consulting Physician: Bishop Limbo, MD; Rounding Team: Jessie Foot, MD; WOC Nurse: Teressa Lower Bonney Aid, RN; Technician: Delynn Flavin; Technician: Ivan Anchors, NT; Utilization Review: Deveron Furlong, RN; Registered Nurse: Ulis Rias, RN   Problem List:   Principal Problem:   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 Active Problems:   Colonic mass - sgimoid with obstruction   Morbid obesity with BMI of 50.0-59.9, adult (HCC)   Hypokalemia   COVID-19 virus infection   Benign prostatic hyperplasia   Hypertensive heart disease with heart failure (HCC)   Colostomy in place [facility])   MRSA (methicillin resistant Staphylococcus aureus) carrier   Panniculus   1 Day Post-Op  06/24/2021  Preoperative Diagnosis: Obstructive sigmoid colon mass  Postoperative Diagnosis: Same   Procedure: Open sigmoid colectomy with end colostomy   Surgeon: Sophronia Simas, MD  Assistant: Karie Soda, MD   Findings: Palpable firm mass in the sigmoid colon, highly suspicious for malignancy, with proximal colonic and small bowel distension consistent with obstruction. All of the colon and small bowel were viable without perforation. No evidence of metastatic disease within the abdomen. Descending and sigmoid resection were performed with creation of an end colostomy.   Assessment  Stabilizing  [facility] Stay = 2 days)  Plan:  -NG tube for ileus related to massive small bowel and colonic distention from chronic colonic obstruction -COVID positive.  No major symptoms.  Defer to primary  service.  Airborne precautions. -Follow-up on pathology -Colostomy care and training.  [place]ott tube in colostomy to help with decompression.  Most likely can remove when ileus resolved -Would benefit from outpatient appointment with wound ostomy clinic -Incisional wound VAC given massive distention, shock, contamination.  Check back Monday Wednesday Friday -IV antibiotics overnight -Pelvic surgical drain given rectal stump dehiscence requiring oversewing.  Looks clear for now. -VTE prophylaxis- SCDs, etc -mobilize as tolerated to help recovery  Disposition:  Disposition:  The patient is from: Home  Anticipate discharge to:  Home with Home Health  Anticipated Date of Discharge is:  January 16,2022    Barriers to discharge:  Pending Clinical improvement (more likely than not)  Patient currently is NOT MEDICALLY STABLE for discharge from the hospital from a surgery standpoint.      25 minutes spent in review, evaluation, examination, counseling, and coordination of care.   I have reviewed this patient's available data, including medical history, events of note, physical examination and test results as part of my evaluation.  A significant portion of that time was spent in counseling.  Care during the described time interval was provided by me.  06/25/2021    Subjective: (Chief complaint)  Patient monitored in stepdown unit.  No major events.  Pain controlled.  On nasal cannula.  Objective:  Vital signs:  Vitals:   06/25/21 0515 06/25/21 0600 06/25/21 0700 06/25/21 0800  BP: 98/63 (!) 84/64 (!) 80/59 101/71  Pulse: (!) 102 (!) 105 (!) 103 100  Resp: (!) 23 16 16 16   Temp:      TempSrc:      SpO2: 92% 90% 93% 93%  Weight:      Height:  Last BM Date: 06/25/21  Intake/Output   Yesterday:  07/09 0701 - 07/10 0700 In: 4768.9 [P.O.:30; I.V.:2266.8; IV Piggyback:2222.1] Out: 1772 [Urine:332; Emesis/NG output:400; Drains:390; Stool:450; Blood:200] This  shift:  No intake/output data recorded.  Bowel function:  Flatus: No  BM:  YES  Drain: Serosanguinous   Physical Exam:  General: Pt awake/alert in no acute distress Eyes: PERRL, normal EOM.  Sclera clear.  No icterus Neuro: CN II-XII intact w/o focal sensory/motor deficits. Lymph: No head/neck/groin lymphadenopathy Psych:  No delerium/psychosis/paranoia.  Oriented x 4 HENT: Normocephalic, Mucus membranes moist.  No thrush Neck: Supple, No tracheal deviation.  No obvious thyromegaly Chest: No pain to chest wall compression.  Good respiratory excursion.  No audible wheezing CV:  Pulses intact.  Regular rhythm.  No major extremity edema MS: Normal AROM mjr joints.  No obvious deformity  Abdomen: Soft.  Obese with large panniculus Mildy distended.  Mildly tender at incisions only.  Incisional wound VAC in place clean dry and intact  Left upper quadrant colostomy with some edema but viable.  [place]ot tube intubating it.  Thin liquid stool in bag but no major flatus No evidence of peritonitis.  No incarcerated hernias.  Ext:  No deformity.  No mjr edema.  No cyanosis Skin: No petechiae / purpurea.  No major sores.  Warm and dry    Results:   Cultures: Recent Results (from the past 720 hour(s))  Resp Panel by RT-PCR (Flu A&B, Covid) Nasopharyngeal Swab     Status: Abnormal   Collection Time: 06/23/21  1:12 PM   Specimen: Nasopharyngeal Swab; Nasopharyngeal(NP) swabs in vial transport medium  Result Value Ref Range Status   SARS Coronavirus 2 by RT PCR POSITIVE (A) NEGATIVE Final    Comment: RESULT CALLED TO, READ BACK BY AND VERIFIED WITH:  COBLE,S RN @1403  06/23/21 EDENSCA (NOTE) SARS-CoV-2 target nucleic acids are DETECTED.  The SARS-CoV-2 RNA is generally detectable in upper respiratory specimens during the acute phase of infection. Positive results are indicative of the presence of the identified virus, but do not rule out bacterial infection or co-infection with other  pathogens not detected by the test. Clinical correlation with patient history and other diagnostic information is necessary to determine patient infection status. The expected result is Negative.  Fact Sheet for Patients: 08/24/21  Fact Sheet for Healthcare Providers: BloggerCourse.com  This test is not yet approved or cleared by the [place] FDA and  has been authorized for detection and/or diagnosis of SARS-CoV-2 by FDA under an Emergency Use Authorization (EUA).  This EUA will remain in effect (meaning this test can be  used) for the duration of  the COVID-19 declaration under Section 564(b)(1) of the Act, 21 U.S.C. section 360bbb-3(b)(1), unless the authorization is terminated or revoked sooner.     Influenza A by PCR NEGATIVE NEGATIVE Final   Influenza B by PCR NEGATIVE NEGATIVE Final    Comment: (NOTE) The Xpert Xpress SARS-CoV-2/FLU/RSV plus assay is intended as an aid in the diagnosis of influenza from Nasopharyngeal swab specimens and should not be used as a sole basis for treatment. Nasal washings and aspirates are unacceptable for Xpert Xpress SARS-CoV-2/FLU/RSV testing.  Fact Sheet for Patients: [place]  Fact Sheet for Healthcare Providers: BloggerCourse.com  This test is not yet approved or cleared by the [place] FDA and has been authorized for detection and/or diagnosis of SARS-CoV-2 by FDA under an Emergency Use Authorization (EUA). This EUA will remain in effect (meaning this test can be used) for  the duration of the COVID-19 declaration under Section 564(b)(1) of the Act, 21 U.S.C. section 360bbb-3(b)(1), unless the authorization is terminated or revoked.  Performed at [facility], [address] [place], [place] [address]   Surgical PCR screen     Status: Abnormal   Collection Time: 06/24/21  1:09 AM   Specimen:  Nasal Mucosa; Nasal Swab  Result Value Ref Range Status   MRSA, PCR POSITIVE (A) NEGATIVE Final    Comment: RESULT CALLED TO, READ BACK BY AND VERIFIED WITH: SHAW,C RN  ON 06/24/21 JACKSON,K    Staphylococcus aureus POSITIVE (A) NEGATIVE Final    Comment: (NOTE) The Xpert SA Assay (FDA approved for NASAL specimens in patients 46 years of age and older), is one component of a comprehensive surveillance program. It is not intended to diagnose infection nor to guide or monitor treatment. Performed at [facility], [address] [place], [place] [address]   MRSA Next Gen by PCR, Nasal     Status: Abnormal   Collection Time: 06/24/21  3:38 PM   Specimen: Nasal Mucosa; Nasal Swab  Result Value Ref Range Status   MRSA by PCR Next Gen DETECTED (A) NOT DETECTED Final    Comment: RESULT CALLED TO, READ BACK BY AND VERIFIED WITH: YOUNTS,D. RN AT 1735 06/24/21 MULLINS,T (NOTE) The GeneXpert MRSA Assay (FDA approved for NASAL specimens only), is one component of a comprehensive MRSA colonization surveillance program. It is not intended to diagnose MRSA infection nor to guide or monitor treatment for MRSA infections. Test performance is not FDA approved in patients less than 90 years old. Performed at [facility], [address] [place], [place] [address]     Labs: Results for orders placed or performed during the hospital encounter of 06/23/21 (from the past 48 hour(s))  Lipase, blood     Status: None   Collection Time: 06/23/21 11:09 AM  Result Value Ref Range   Lipase 22 11 - 51 U/L    Comment: Performed at [facility], [address] [place], [place] [address]  Comprehensive metabolic panel     Status: Abnormal   Collection Time: 06/23/21 11:09 AM  Result Value Ref Range   Sodium 138 135 - 145 mmol/L   Potassium 3.4 (L) 3.5 - 5.1 mmol/L   Chloride 105 98 - 111 mmol/L   CO2 25 22 - 32 mmol/L   Glucose, Bld 115 (H) 70 - 99  mg/dL    Comment: Glucose reference range applies only to samples taken after fasting for at least 8 hours.   BUN 13 8 - 23 mg/dL   Creatinine, Ser 2.95 0.61 - 1.24 mg/dL   Calcium 9.0 8.9 - 62.1 mg/dL   Total Protein 8.9 (H) 6.5 - 8.1 g/dL   Albumin 3.6 3.5 - 5.0 g/dL   AST 18 15 - 41 U/L   ALT 10 0 - 44 U/L   Alkaline Phosphatase 86 38 - 126 U/L   Total Bilirubin 0.8 0.3 - 1.2 mg/dL   GFR, Estimated >30 >86 mL/min    Comment: (NOTE) Calculated using the CKD-EPI Creatinine Equation (2021)    Anion gap 8 5 - 15    Comment: Performed at [facility], [address] [place], [place] [address]  CBC     Status: Abnormal   Collection Time: 06/23/21 11:09 AM  Result Value Ref Range   WBC 12.4 (H) 4.0 - 10.5 K/uL  RBC 5.50 4.22 - 5.81 MIL/uL   Hemoglobin 16.7 13.0 - 17.0 g/dL   HCT 09.848.2 11.939.0 - 14.752.0 %   MCV 87.6 80.0 - 100.0 fL   MCH 30.4 26.0 - 34.0 pg   MCHC 34.6 30.0 - 36.0 g/dL   RDW 82.913.6 56.211.5 - 13.015.5 %   Platelets 277 150 - 400 K/uL   nRBC 0.0 0.0 - 0.2 %    Comment: Performed at [facility] [place], [address] [place], [place] [address]  Urinalysis, Routine w reflex microscopic Urine, Clean Catch     Status: Abnormal   Collection Time: 06/23/21 11:09 AM  Result Value Ref Range   Color, Urine AMBER (A) YELLOW    Comment: BIOCHEMICALS MAY BE AFFECTED BY COLOR   APPearance CLEAR CLEAR   Specific Gravity, Urine 1.020 1.005 - 1.030   pH 5.5 5.0 - 8.0   Glucose, UA NEGATIVE NEGATIVE mg/dL   Hgb urine dipstick LARGE (A) NEGATIVE   Bilirubin Urine MODERATE (A) NEGATIVE   Ketones, ur NEGATIVE NEGATIVE mg/dL   Protein, ur 30 (A) NEGATIVE mg/dL   Nitrite NEGATIVE NEGATIVE   Leukocytes,Ua TRACE (A) NEGATIVE    Comment: Performed at [facility] [place], [address] [place], [place] [address]  Brain natriuretic peptide     Status: None   Collection Time: 06/23/21 11:09 AM  Result Value Ref Range   B Natriuretic Peptide 25.5 0.0 - 100.0 pg/mL    Comment:  Performed at [facility] [place], [address] [place], [place] [address]  Urinalysis, Microscopic (reflex)     Status: Abnormal   Collection Time: 06/23/21 11:09 AM  Result Value Ref Range   RBC / HPF 21-50 0 - 5 RBC/hpf   WBC, UA 0-5 0 - 5 WBC/hpf   Bacteria, UA FEW (A) NONE SEEN   Squamous Epithelial / LPF 0-5 0 - 5   Hyaline Casts, UA PRESENT    Granular Casts, UA PRESENT     Comment: Performed at [facility] [place], [address] [place], [place] [address]  Resp Panel by RT-PCR (Flu A&B, Covid) Nasopharyngeal Swab     Status: Abnormal   Collection Time: 06/23/21  1:12 PM   Specimen: Nasopharyngeal Swab; Nasopharyngeal(NP) swabs in vial transport medium  Result Value Ref Range   SARS Coronavirus 2 by RT PCR POSITIVE (A) NEGATIVE    Comment: RESULT CALLED TO, READ BACK BY AND VERIFIED WITH:  COBLE,S RN @1403  06/23/21 EDENSCA (NOTE) SARS-CoV-2 target nucleic acids are DETECTED.  The SARS-CoV-2 RNA is generally detectable in upper respiratory specimens during the acute phase of infection. Positive results are indicative of the presence of the identified virus, but do not rule out bacterial infection or co-infection with other pathogens not detected by the test. Clinical correlation with patient history and other diagnostic information is necessary to determine patient infection status. The expected result is Negative.  Fact Sheet for Patients: BloggerCourse.comhttps://www.fda.gov/media/152166/download  Fact Sheet for Healthcare Providers: SeriousBroker.ithttps://www.fda.gov/media/152162/download  This test is not yet approved or cleared by the [place] FDA and  has been authorized for detection and/or diagnosis of SARS-CoV-2 by FDA under an Emergency Use Authorization (EUA).  This EUA will remain in effect (meaning this test can be  used) for the duration of  the COVID-19 declaration under Section 564(b)(1) of the Act, 21 U.S.C. section 360bbb-3(b)(1), unless the authorization is terminated  or revoked sooner.     Influenza A by PCR NEGATIVE  NEGATIVE   Influenza B by PCR NEGATIVE NEGATIVE    Comment: (NOTE) The Xpert Xpress SARS-CoV-2/FLU/RSV plus assay is intended as an aid in the diagnosis of influenza from Nasopharyngeal swab specimens and should not be used as a sole basis for treatment. Nasal washings and aspirates are unacceptable for Xpert Xpress SARS-CoV-2/FLU/RSV testing.  Fact Sheet for Patients: BloggerCourse.com  Fact Sheet for Healthcare Providers: SeriousBroker.it  This test is not yet approved or cleared by the [place] FDA and has been authorized for detection and/or diagnosis of SARS-CoV-2 by FDA under an Emergency Use Authorization (EUA). This EUA will remain in effect (meaning this test can be used) for the duration of the COVID-19 declaration under Section 564(b)(1) of the Act, 21 U.S.C. section 360bbb-3(b)(1), unless the authorization is terminated or revoked.  Performed at [facility], [address] [place], [place] [address]   Surgical PCR screen     Status: Abnormal   Collection Time: 06/24/21  1:09 AM   Specimen: Nasal Mucosa; Nasal Swab  Result Value Ref Range   MRSA, PCR POSITIVE (A) NEGATIVE    Comment: RESULT CALLED TO, READ BACK BY AND VERIFIED WITH: SHAW,C RN  ON 06/24/21 JACKSON,K    Staphylococcus aureus POSITIVE (A) NEGATIVE    Comment: (NOTE) The Xpert SA Assay (FDA approved for NASAL specimens in patients 28 years of age and older), is one component of a comprehensive surveillance program. It is not intended to diagnose infection nor to guide or monitor treatment. Performed at [facility], [address], [place], [place] [address]   HIV Antibody (routine testing w rflx)     Status: None   Collection Time: 06/24/21  2:47 AM  Result Value Ref Range   HIV Screen 4th Generation wRfx Non Reactive Non Reactive    Comment: Performed at  [facility], [address], [place], [place] [address]  Comprehensive metabolic panel     Status: Abnormal   Collection Time: 06/24/21  2:47 AM  Result Value Ref Range   Sodium 141 135 - 145 mmol/L   Potassium 3.3 (L) 3.5 - 5.1 mmol/L   Chloride 106 98 - 111 mmol/L   CO2 25 22 - 32 mmol/L   Glucose, Bld 95 70 - 99 mg/dL    Comment: Glucose reference range applies only to samples taken after fasting for at least 8 hours.   BUN 19 8 - 23 mg/dL   Creatinine, Ser 4.69 0.61 - 1.24 mg/dL   Calcium 8.3 (L) 8.9 - 10.3 mg/dL   Total Protein 7.4 6.5 - 8.1 g/dL   Albumin 3.1 (L) 3.5 - 5.0 g/dL   AST 12 (L) 15 - 41 U/L   ALT 11 0 - 44 U/L   Alkaline Phosphatase 69 38 - 126 U/L   Total Bilirubin 1.3 (H) 0.3 - 1.2 mg/dL   GFR, Estimated >62 >95 mL/min    Comment: (NOTE) Calculated using the CKD-EPI Creatinine Equation (2021)    Anion gap 10 5 - 15    Comment: Performed at [facility], [address], [place], [place] [address]  CBC     Status: Abnormal   Collection Time: 06/24/21  2:47 AM  Result Value Ref Range   WBC 11.0 (H) 4.0 - 10.5 K/uL   RBC 5.00 4.22 - 5.81 MIL/uL   Hemoglobin 14.8 13.0 - 17.0 g/dL   HCT 24.4 01.0 - 27.2 %   MCV 89.8 80.0 - 100.0 fL  MCH 29.6 26.0 - 34.0 pg   MCHC 33.0 30.0 - 36.0 g/dL   RDW 16.1 09.6 - 04.5 %   Platelets 238 150 - 400 K/uL   nRBC 0.0 0.0 - 0.2 %    Comment: Performed at [facility], [address] [place], [place] [address]  Protime-INR     Status: None   Collection Time: 06/24/21  2:47 AM  Result Value Ref Range   Prothrombin Time 14.2 11.4 - 15.2 seconds   INR 1.1 0.8 - 1.2    Comment: (NOTE) INR goal varies based on device and disease states. Performed at [facility], [address] [place], [place] [address]   APTT     Status: None   Collection Time: 06/24/21  2:47 AM  Result Value Ref Range   aPTT 31 24 - 36 seconds    Comment: Performed at [facility], [address] [place], [place] [address]  Magnesium     Status: None   Collection Time: 06/24/21  2:47 AM  Result Value Ref Range   Magnesium 2.1 1.7 - 2.4 mg/dL    Comment: Performed at [facility], [address] [place], [place] [address]  Phosphorus     Status: None   Collection Time: 06/24/21  2:47 AM  Result Value Ref Range   Phosphorus 3.8 2.5 - 4.6 mg/dL    Comment: Performed at [facility], [address] [place], [place] [address]  Hemoglobin A1c     Status: None   Collection Time: 06/24/21  2:47 AM  Result Value Ref Range   Hgb A1c MFr Bld 5.1 4.8 - 5.6 %    Comment: (NOTE) Pre diabetes:          5.7%-6.4%  Diabetes:              >6.4%  Glycemic control for   <7.0% adults with diabetes    Mean Plasma Glucose 99.67 mg/dL    Comment: Performed at [facility], [address] [place], [place] [address]  MRSA Next Gen by PCR, Nasal     Status: Abnormal   Collection Time: 06/24/21  3:38 PM   Specimen: Nasal Mucosa; Nasal Swab  Result Value Ref Range   MRSA by PCR Next Gen DETECTED (A) NOT DETECTED    Comment: RESULT CALLED TO, READ BACK BY AND VERIFIED WITH: YOUNTS,D. RN AT 1735 06/24/21 MULLINS,T (NOTE) The GeneXpert MRSA Assay (FDA approved for NASAL specimens only), is one component of a comprehensive MRSA colonization surveillance program. It is not intended to diagnose MRSA infection nor to guide or monitor treatment for MRSA infections. Test performance is not FDA approved in patients less than 17 years old. Performed at [facility], [address], [place], [place] [address]   CBC     Status: None   Collection Time: 06/25/21  2:26 AM  Result Value Ref Range   WBC 7.5 4.0 - 10.5 K/uL   RBC 4.91 4.22 - 5.81 MIL/uL   Hemoglobin 14.6 13.0 - 17.0 g/dL   HCT 32.4 40.1 - 02.7 %   MCV 90.2 80.0 - 100.0 fL   MCH 29.7 26.0 - 34.0 pg   MCHC 33.0 30.0 - 36.0 g/dL   RDW 25.3  66.4 - 40.3 %   Platelets 238 150 - 400 K/uL   nRBC 0.0 0.0 - 0.2 %    Comment: Performed at [facility],  [address] [place], [place] [address]  Magnesium     Status: None   Collection Time: 06/25/21  2:26 AM  Result Value Ref Range   Magnesium 1.9 1.7 - 2.4 mg/dL    Comment: Performed at [facility], [address], [place], [place] [address]  Phosphorus     Status: Abnormal   Collection Time: 06/25/21  2:26 AM  Result Value Ref Range   Phosphorus 5.3 (H) 2.5 - 4.6 mg/dL    Comment: Performed at [facility], [address], [place], [place] [address]  Basic metabolic panel     Status: Abnormal   Collection Time: 06/25/21  2:26 AM  Result Value Ref Range   Sodium 143 135 - 145 mmol/L   Potassium 3.9 3.5 - 5.1 mmol/L   Chloride 108 98 - 111 mmol/L   CO2 25 22 - 32 mmol/L   Glucose, Bld 127 (H) 70 - 99 mg/dL    Comment: Glucose reference range applies only to samples taken after fasting for at least 8 hours.   BUN 30 (H) 8 - 23 mg/dL   Creatinine, Ser 1.06 (H) 0.61 - 1.24 mg/dL    Comment: DELTA CHECK NOTED   Calcium 7.7 (L) 8.9 - 10.3 mg/dL   GFR, Estimated 29 (L) >60 mL/min    Comment: (NOTE) Calculated using the CKD-EPI Creatinine Equation (2021)    Anion gap 10 5 - 15    Comment: Performed at [facility], [address], [place], [place] [address]  Prealbumin     Status: Abnormal   Collection Time: 06/25/21  2:26 AM  Result Value Ref Range   Prealbumin 10.4 (L) 18 - 38 mg/dL    Comment: Performed at [facility], [address] [place], [place] [address]    Imaging / Studies: CT ABDOMEN PELVIS W CONTRAST  Result Date: 06/23/2021 CLINICAL DATA:  Abdominal distension, constipation EXAM: CT ABDOMEN AND PELVIS WITH CONTRAST TECHNIQUE: Multidetector CT imaging of the abdomen and pelvis was performed using the standard protocol following bolus administration of intravenous  contrast. CONTRAST:  OMNIPAQUE IOHEXOL 300 MG/ML  SOLN COMPARISON:  None. FINDINGS: Lower chest: The included lung bases are clear. Heart size within normal limits. Hepatobiliary: No focal liver abnormality is seen. No gallstones, gallbladder wall thickening, or biliary dilatation. Pancreas: Unremarkable. No pancreatic ductal dilatation or surrounding inflammatory changes. Spleen: Normal in size without focal abnormality. Adrenals/Urinary Tract: Unremarkable adrenal glands. No adrenal nodules or masses. Simple 1.6 cm lower pole right renal cyst. Simple 1.3 cm cyst within the interpolar region of the left kidney. No renal stone or hydronephrosis. Urinary bladder is incompletely distended, limiting its evaluation. Stomach/Bowel: Large mass within the proximal sigmoid colon with approximate measurements of 8.6 x 3.8 x 3.6 cm (series 2, images 65-77). Multiple small adjacent pericolonic lymph nodes concerning for metastatic disease. Mass completely occupies the colonic lumen at this location. There is associated obstruction of large and small bowel upstream from this level. Fecalization of small bowel content. No pneumatosis is seen. An air-filled appendix is present. Stomach is mildly distended but otherwise within normal limits. Vascular/Lymphatic: No vascular abnormality is identified. Scattered nonenlarged retroperitoneal lymph nodes are nonspecific. Reproductive: Mildly enlarged prostate gland. Other: No free fluid. No abdominopelvic fluid collection. No pneumoperitoneum. No abdominal wall hernia. Musculoskeletal: No acute osseous abnormality. No suspicious bone lesion. Severe osteoarthritis of the right hip. IMPRESSION: 1. Large obstructing colonic mass within the proximal sigmoid colon measuring up  to 8.6 cm. There is associated mechanical obstruction of the large and small bowel upstream from this mass. Surgical evaluation is recommended. 2. Multiple mildly prominent pericolonic lymph nodes concerning for  metastatic disease. Scattered nonenlarged retroperitoneal lymph nodes are nonspecific. 3. Mildly enlarged prostate gland. These results were called by telephone at the time of interpretation on 06/23/2021 at 1:04 pm to provider [facility] , who verbally acknowledged these results. Electronically Signed   By: Duanne Guess D.O.   On: 06/23/2021 13:09   DG Chest Portable 1 View  Result Date: 06/23/2021 CLINICAL DATA:  Edema. EXAM: PORTABLE CHEST 1 VIEW COMPARISON:  April 07, 2016. FINDINGS: Stable cardiomegaly. Both lungs are clear. The visualized skeletal structures are unremarkable. IMPRESSION: No active disease. Electronically Signed   By: Lupita Raider M.D.   On: 06/23/2021 12:55    Medications / Allergies: per chart  Antibiotics: Anti-infectives (From admission, onward)    Start     Dose/Rate Route Frequency Ordered Stop   06/24/21 2200  cefoTEtan (CEFOTAN) 2 g in sodium chloride 0.9 % 100 mL IVPB        2 g 200 mL/hr over 30 Minutes Intravenous Every 12 hours 06/24/21 1514 06/24/21 2109   06/24/21 0830  cefoTEtan (CEFOTAN) 2 g in sodium chloride 0.9 % 100 mL IVPB        2 g 200 mL/hr over 30 Minutes Intravenous On call to O.R. 06/24/21 0733 06/24/21 1117         Note: Portions of this report may have been transcribed using voice recognition software. Every effort was made to ensure accuracy; however, inadvertent computerized transcription errors may be present.   Any transcriptional errors that result from this process are unintentional.    Ardeth Sportsman, MD, FACS, MASCRS Esophageal, Gastrointestinal & Colorectal Surgery Robotic and Minimally Invasive Surgery  [facility] [address] [place], [place] [address] [phone] Fax [phone] Main/Paging  CONTACT INFORMATION: Weekday (9AM-5PM) concerns: Call [facility] main office at [phone] Weeknight (5PM-9AM) or Weekend/Holiday concerns: Check [email or web address] for General Surgery  [facility] coverage (Please, do not use SecureChat as it is not reliable communication to operating surgeons for immediate patient care)      06/25/2021  8:10 AM

## 2021-06-25 NOTE — Consult Note (Signed)
[facility] Renal Consultation Note  Requesting MD: Cipriano Bunker, MD Indication for Consultation:  AKI  Chief complaint: abdominal pain  HPI:  Travis Calderon is a 65 y.o. male with a history of obesity and diverticulosis who presented the ER with abdominal pain.  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  He underwent open sigmoid colectomy  with end colostomy on 7/9.  He was found to have AKI and has been hypotensive.  He was also found to be COVID-positive.  Not getting covid therapy at this time.  He had 300 mL uop over 7/9.  He has been on low rate of LR.  He states he does follow with a physician and that he is not diagnosed with DM, HTN, prior CVA, or hx of MI/CAD.  He does take alka-seltzer infrequently for sinuses (less than once a month if that).  Per screening HbA1c 5.1 on 7/9.  His sister is at bedside and his daughter joins via phone.  Blood pressures in the 60's are charted.  Now improved to 90's to low 100 range.    Creatinine, Ser  Date/Time Value Ref Range Status  06/25/2021 02:26 AM 2.43 (H) 0.61 - 1.24 mg/dL Final    Comment:    DELTA CHECK NOTED  06/24/2021 02:47 AM 1.01 0.61 - 1.24 mg/dL Final  85/27/7824 23:53 AM 0.94 0.61 - 1.24 mg/dL Final    PMHx:   Past Medical History:  Diagnosis Date   Obesity     Past Surgical History:  Procedure Laterality Date   COLECTOMY WITH COLOSTOMY CREATION/HARTMANN PROCEDURE N/A 06/24/2021   Procedure: COLECTOMY WITH COLOSTOMY CREATION/HARTMANN PROCEDURE, SIGMOID;  Surgeon: Fritzi Mandes, MD;  Location: [facility];  Service: General;  Laterality: N/A;   HERNIA REPAIR      Family Hx:  Family History  Problem Relation Age of Onset   Colon cancer Neg Hx   No family hx of ESRD  Social History:  reports that he has never smoked. He has never used smokeless tobacco. He reports current alcohol use. He reports that he does not use drugs.  Allergies: No Known Allergies  Medications: Prior to Admission  medications   Not on File    I have reviewed the patient's current medications.  Note that his prior to admission medications are not on file.  To clarify - he tells me that   Labs:  BMP Latest Ref Rng & Units 06/25/2021 06/24/2021 06/23/2021  Glucose 70 - 99 mg/dL 614(E) 95 315(Q)  BUN 8 - 23 mg/dL 00(Q) 19 13  Creatinine 0.61 - 1.24 mg/dL 6.76(P) 9.50 9.32  Sodium 135 - 145 mmol/L 143 141 138  Potassium 3.5 - 5.1 mmol/L 3.9 3.3(L) 3.4(L)  Chloride 98 - 111 mmol/L 108 106 105  CO2 22 - 32 mmol/L 25 25 25   Calcium 8.9 - 10.3 mg/dL 7.7(L) 8.3(L) 9.0    Urinalysis    Component Value Date/Time   COLORURINE AMBER (A) 06/23/2021 1109   APPEARANCEUR CLEAR 06/23/2021 1109   LABSPEC 1.020 06/23/2021 1109   PHURINE 5.5 06/23/2021 1109   GLUCOSEU NEGATIVE 06/23/2021 1109   HGBUR LARGE (A) 06/23/2021 1109   BILIRUBINUR MODERATE (A) 06/23/2021 1109   KETONESUR NEGATIVE 06/23/2021 1109   PROTEINUR 30 (A) 06/23/2021 1109   NITRITE NEGATIVE 06/23/2021 1109   LEUKOCYTESUR TRACE (A) 06/23/2021 1109     ROS:  Pertinent items noted in HPI and remainder of comprehensive ROS otherwise negative.   Physical Exam: Vitals:   06/25/21  0900 06/25/21 1200  BP: 101/68   Pulse: (!) 109   Resp: 17   Temp:  98.7 F (37.1 C)  SpO2: 92%      General:  adult male in bed  HEENT: NCAT Eyes: EOMI sclera anicteric Neck: increased circumference; supple Heart: tachycardic, S1S2 no rub Lungs: clear but reduced on auscultation; on 3 liters oxygen nasal cannula Abdomen: softly distended. Wound vac in place Extremities: no pitting edema.  No cyanosis or clubbing Skin: no rash on extremities exposed Neuro: alert and oriented x 3 provides a hx and follows commands Psych: normal mood and affect GU - foley catheter in place  Assessment/Plan:  # AKI  - ischemic ATN in the setting of hypotension as well as pre-renal losses.  No hydro on CT scan.  He got contrast on 7/8 but had normal GFR at that time so not  the culprit.  UA with 30 mg/dL protein prior to his hypotension it appears.  RBC's in initial sample as well - unsure of relationship to catheter - Continue supportive care  - Transition to NS at 100 ml/hr as tolerated - Check repeat UA and obtain up/cr ratio  # Hypotension  - Start midodrine 5 mg TID  - Continue fluids as above  # Colonic mass - s/p Open sigmoid colectomy with end colostomy on 7/9 - colostomy pre-disposes to pre-renal insults  # Covid positive - Therapies per primary team   # Acute hypoxic respiratory failure  - on supplemental oxygen   # Obesity  - body habitus does complicate volume status assessment.   Estanislado Emms 06/25/2021, 2:10 PM

## 2021-06-26 DIAGNOSIS — K56609 Unspecified intestinal obstruction, unspecified as to partial versus complete obstruction: Secondary | ICD-10-CM | POA: Diagnosis not present

## 2021-06-26 LAB — BASIC METABOLIC PANEL
Anion gap: 9 (ref 5–15)
BUN: 53 mg/dL — ABNORMAL HIGH (ref 8–23)
CO2: 23 mmol/L (ref 22–32)
Calcium: 6.8 mg/dL — ABNORMAL LOW (ref 8.9–10.3)
Chloride: 110 mmol/L (ref 98–111)
Creatinine, Ser: 2.82 mg/dL — ABNORMAL HIGH (ref 0.61–1.24)
GFR, Estimated: 24 mL/min — ABNORMAL LOW (ref >60)
Glucose, Bld: 106 mg/dL — ABNORMAL HIGH (ref 70–99)
Potassium: 3.7 mmol/L (ref 3.5–5.1)
Sodium: 142 mmol/L (ref 135–145)

## 2021-06-26 LAB — CBC
HCT: 38 % — ABNORMAL LOW (ref 39.0–52.0)
Hemoglobin: 12.4 g/dL — ABNORMAL LOW (ref 13.0–17.0)
MCH: 29.7 pg (ref 26.0–34.0)
MCHC: 32.6 g/dL (ref 30.0–36.0)
MCV: 90.9 fL (ref 80.0–100.0)
Platelets: 184 10*3/uL (ref 150–400)
RBC: 4.18 MIL/uL — ABNORMAL LOW (ref 4.22–5.81)
RDW: 14.2 % (ref 11.5–15.5)
WBC: 4.4 10*3/uL (ref 4.0–10.5)
nRBC: 0 % (ref 0.0–0.2)

## 2021-06-26 MED ORDER — OXYCODONE HCL 5 MG PO TABS: 5.0000 mg | ORAL_TABLET | ORAL | Status: DC | PRN

## 2021-06-26 MED ORDER — HYDROMORPHONE HCL 1 MG/ML IJ SOLN
0.5000 mg | INTRAMUSCULAR | Status: DC | PRN
  Administered 2021-07-12: 1 mg via INTRAVENOUS
  Filled 2021-06-26 (×2): qty 1

## 2021-06-26 MED ORDER — METHOCARBAMOL 500 MG PO TABS
500.0000 mg | ORAL_TABLET | Freq: Three times a day (TID) | ORAL | Status: DC
  Administered 2021-06-26: 500 mg via ORAL
  Filled 2021-06-26 (×3): qty 1

## 2021-06-26 MED ORDER — ACETAMINOPHEN 500 MG PO TABS
1000.0000 mg | ORAL_TABLET | Freq: Four times a day (QID) | ORAL | Status: DC
  Administered 2021-06-26 – 2021-06-27 (×3): 1000 mg via ORAL
  Filled 2021-06-26 (×3): qty 2

## 2021-06-26 MED ORDER — DOCUSATE SODIUM 100 MG PO CAPS
100.0000 mg | ORAL_CAPSULE | Freq: Two times a day (BID) | ORAL | Status: DC
  Administered 2021-06-26 – 2021-07-04 (×11): 100 mg via ORAL
  Filled 2021-06-26 (×12): qty 1

## 2021-06-26 MED ORDER — POLYETHYLENE GLYCOL 3350 17 G PO PACK: 17.0000 g | PACK | Freq: Every day | ORAL | Status: DC | PRN

## 2021-06-26 MED ORDER — MELATONIN 3 MG PO TABS
3.0000 mg | ORAL_TABLET | Freq: Every day | ORAL | Status: DC
  Administered 2021-06-26 – 2021-07-18 (×22): 3 mg via ORAL
  Filled 2021-06-26 (×22): qty 1

## 2021-06-26 MED ORDER — HEPARIN SODIUM (PORCINE) 5000 UNIT/ML IJ SOLN
5000.0000 [IU] | Freq: Three times a day (TID) | INTRAMUSCULAR | Status: DC
  Administered 2021-06-26 – 2021-07-19 (×69): 5000 [IU] via SUBCUTANEOUS
  Filled 2021-06-26 (×68): qty 1

## 2021-06-26 MED ORDER — SODIUM CHLORIDE 0.9 % IV BOLUS
500.0000 mL | Freq: Once | INTRAVENOUS | Status: AC
  Administered 2021-06-26: 500 mL via INTRAVENOUS

## 2021-06-26 NOTE — Progress Notes (Signed)
[facility] Progress Note  Subjective: 400 cc UOP yest and 600 cc today so far, BP's today are over 90-100 systolic. Creat up slightly to 2.8 from 2.4  Vitals:   06/26/21 0700 06/26/21 0800 06/26/21 0900 06/26/21 1000  BP: 101/63 105/72  103/65  Pulse: (!) 105 (!) 102 100 (!) 103  Resp: 14 15 17 17   Temp:  98.5 F (36.9 C)    TempSrc:  Oral    SpO2: 96% 94% 93% 96%  Weight:      Height:        Exam: General:  adult male in bed  Neck no JVD Heart: tachycardic, S1S2 no rub Lungs: clear but reduced on auscultation; on 3 liters oxygen nasal cannula Abdomen: softly distended. Wound vac in place Extremities: no pitting edema.  No cyanosis or clubbing Neuro: alert and oriented x 3 provides a hx and follows commands GU - foley catheter in place      UA many bact, >50 rbc, 6-10 wbc, 30 prot    UPC ration 0.28, wnl      Assessment/Plan:   # AKI-  ischemic ATN in the setting of hypotension as well as pre-renal losses.  No hydro on CT scan.  He got contrast on 7/8 but had normal GFR at that time so not the culprit.  UA with 30 mg/dL protein prior to his hypotension it appears. Making urine and hypotension is improving. Creat up slightly today, may be starting to recover. Cont supportive care, lower IVF's to 75 cc/hr.     # Hypotension - started midodrine 5 mg TID, can ^ this is needed.    # Colonic mass - s/p Open sigmoid colectomy with end colostomy on 7/9. Colostomy output > 1 L yesterday.    # Covid positive - per primary team   # Acute hypoxic respiratory failure - on supplemental oxygen. CXR 7/08 wnl.       Rob Atharva Mirsky 06/26/2021, 12:28 PM   Recent Labs  Lab 06/24/21 0247 06/25/21 0226 06/26/21 0320  K 3.3* 3.9 3.7  BUN 19 30* 53*  CREATININE 1.01 2.43* 2.82*  CALCIUM 8.3* 7.7* 6.8*  PHOS 3.8 5.3*  --   HGB 14.8 14.6 12.4*   Inpatient medications:  chlorhexidine  15 mL Mouth Rinse BID   Chlorhexidine Gluconate Cloth  6 each Topical Daily    heparin injection (subcutaneous)  5,000 Units Subcutaneous Q8H   lip balm  1 application Topical BID   mouth rinse  15 mL Mouth Rinse BID   midodrine  5 mg Oral TID WC   mupirocin ointment  1 application Nasal BID    sodium chloride 100 mL/hr at 06/26/21 0836   albuterol, diphenhydrAMINE, HYDROmorphone (DILAUDID) injection, magic mouthwash, menthol-cetylpyridinium, metoprolol tartrate, ondansetron **OR** ondansetron (ZOFRAN) IV, phenol, prochlorperazine, simethicone

## 2021-06-26 NOTE — Evaluation (Signed)
Physical Therapy Evaluation Patient Details Name: Travis Calderon MRN: [id number] DOB: 1956/11/26 Today's Date: 06/26/2021   History of Present Illness  This 65 years old male with PMH significant for obesity, diverticulosis presented to the ED 06/23/21 with complaints of abdominal pain and abnormal bowel movements.He is found to be COVID-positive, CT abdomen shows large obstructing colonic mass.  Patient underwent Open sigmoid colectomy with end colostomy.on 06/24/21.Incisional wound VAC given massive distention, shock, contamination.  Clinical Impression  The patient eager to sit up on bed edge, requiring 2 max assist for sitting and return to sidelying. Patient stood  holding onto back of recliner x 4 minutes. Patient should progress to return to ambulatory status in time.  Pt admitted with above diagnosis.  Pt currently with functional limitations due to the deficits listed below (see PT Problem List). Pt will benefit from skilled PT to increase their independence and safety with mobility to allow discharge to the venue listed below.         Follow Up Recommendations SNF    Equipment Recommendations  Rolling walker with 5" wheels    Recommendations for Other Services       Precautions / Restrictions Precautions Precautions: Fall Precaution Comments: colostomy, right JP drain, VAC lower abd      Mobility  Bed Mobility Overal bed mobility: Needs Assistance Bed Mobility: Rolling;Sidelying to Sit;Sit to Sidelying Rolling: Mod assist Sidelying to sit: Max assist;+2 for physical assistance;+2 for safety/equipment     Sit to sidelying: Max assist;+2 for safety/equipment;+2 for physical assistance General bed mobility comments: assist with legs and trunk to sit upright and return to side then supine.    Transfers Overall transfer level: Needs assistance   Transfers: Sit to/from Stand Sit to Stand: +2 physical assistance;+2 safety/equipment;Mod assist         General  transfer comment: holding onto back of recliner, stood x 1 for ~4 minutes  for linens changed and get washed up. HR 113. SPO2 > 94 % RA  Ambulation/Gait             General Gait Details: TBA  Stairs            Wheelchair Mobility    Modified Rankin (Stroke Patients Only)       Balance Overall balance assessment: Needs assistance Sitting-balance support: Bilateral upper extremity supported;Feet supported Sitting balance-Leahy Scale: Fair     Standing balance support: During functional activity;Bilateral upper extremity supported Standing balance-Leahy Scale: Poor Standing balance comment: supported in standing, UE support                             Pertinent Vitals/Pain Pain Assessment: Faces Faces Pain Scale: Hurts little more Pain Location: abdomen to sit up and lie down. Pain Descriptors / Indicators: Discomfort Pain Intervention(s): Monitored during session;Limited activity within patient's tolerance    Home Living Family/patient expects to be discharged to:: Private residence Living Arrangements:  (sister) Available Help at Discharge: Family;Available PRN/intermittently Type of Home: Apartment Home Access: Stairs to enter   Entrance Stairs-Number of Steps: 13 Home Layout: One level Home Equipment: None      Prior Function Level of Independence: Independent               Hand Dominance        Extremity/Trunk Assessment   Upper Extremity Assessment Upper Extremity Assessment: Overall WFL for tasks assessed    Lower Extremity Assessment Lower Extremity Assessment: Generalized weakness  Cervical / Trunk Assessment Cervical / Trunk Assessment: Other exceptions Cervical / Trunk Exceptions: body habitus  Communication   Communication: No difficulties  Cognition Arousal/Alertness: Awake/alert Behavior During Therapy: WFL for tasks assessed/performed Overall Cognitive Status: Within Functional Limits for tasks assessed                                  General Comments: oriented to july, not date      General Comments      Exercises     Assessment/Plan    PT Assessment Patient needs continued PT services  PT Problem List Decreased strength;Decreased mobility;Decreased knowledge of precautions;Decreased activity tolerance;Decreased balance;Decreased knowledge of use of DME;Pain;Obesity       PT Treatment Interventions DME instruction;Therapeutic activities;Gait training;Therapeutic exercise;Patient/family education;Functional mobility training;Balance training    PT Goals (Current goals can be found in the Care Plan section)  Acute Rehab PT Goals Patient Stated Goal: o up and walk PT Goal Formulation: With patient Time For Goal Achievement: 07/10/21 Potential to Achieve Goals: Good    Frequency Min 2X/week   Barriers to discharge Decreased caregiver support      Co-evaluation               AM-PAC PT "6 Clicks" Mobility  Outcome Measure Help needed turning from your back to your side while in a flat bed without using bedrails?: A Lot Help needed moving from lying on your back to sitting on the side of a flat bed without using bedrails?: A Lot Help needed moving to and from a bed to a chair (including a wheelchair)?: A Lot Help needed standing up from a chair using your arms (e.g., wheelchair or bedside chair)?: A Lot Help needed to walk in hospital room?: A Lot Help needed climbing 3-5 steps with a railing? : Total 6 Click Score: 11    End of Session   Activity Tolerance: Patient tolerated treatment well Patient left: in bed;with call bell/phone within reach;with nursing/sitter in room Nurse Communication: Mobility status PT Visit Diagnosis: Unsteadiness on feet (R26.81);Difficulty in walking, not elsewhere classified (R26.2);Pain    Time: 1530-1602 PT Time Calculation (min) (ACUTE ONLY): 32 min   Charges:   PT Evaluation $PT Eval Moderate Complexity: 1  Mod PT Treatments $Therapeutic Activity: 8-22 mins        Blanchard Kelch PT Acute Rehabilitation Services Pager [phone] Office [phone]   Rada Hay 06/26/2021, 4:32 PM

## 2021-06-26 NOTE — Progress Notes (Signed)
Progress Note  2 Days Post-Op  Subjective: Patient has not been out of bed and per RN was refusing all mobility over the weekend. Patient having colostomy output. Reports some abdominal pain. UOP has been low and nephrology was consulted yesterday. I updated patient's sister, Burna Mortimer, while I was in the room on the phone.   Objective: Vital signs in last 24 hours: Temp:  [97.6 F (36.4 C)-98.7 F (37.1 C)] 97.6 F (36.4 C) (07/11 0400) Pulse Rate:  [104-114] 105 (07/11 0600) Resp:  [13-22] 17 (07/11 0600) BP: (79-148)/(47-73) 109/71 (07/11 0600) SpO2:  [91 %-96 %] 91 % (07/11 0600) Last BM Date: 06/26/21  Intake/Output from previous day: 07/10 0701 - 07/11 0700 In: 2981.3 [P.O.:210; I.V.:1257.5; IV Piggyback:1513.8] Out: 2075 [Urine:575; Emesis/NG output:125; Drains:25; Stool:1350] Intake/Output this shift: No intake/output data recorded.  PE: General: pleasant, WD, morbidly obese male who is laying in bed in NAD Heart: sinus tachycardia in the low 100s.  Normal s1,s2. No obvious murmurs, gallops, or rubs noted.   Lungs: CTAB, no wheezes, rhonchi, or rales noted.  Respiratory effort nonlabored Abd: soft, appropriately ttp, drain in RUQ with ss fluid, VAC to midline wound, colostomy in LLQ with soft brown stool present, NGT with minimal drainage Neuro: Cranial nerves 2-12 grossly intact, sensation is normal throughout Psych: A&Ox3 with an appropriate affect.    Lab Results:  Recent Labs    06/25/21 0226 06/26/21 0320  WBC 7.5 4.4  HGB 14.6 12.4*  HCT 44.3 38.0*  PLT 238 184   BMET Recent Labs    06/25/21 0226 06/26/21 0320  NA 143 142  K 3.9 3.7  CL 108 110  CO2 25 23  GLUCOSE 127* 106*  BUN 30* 53*  CREATININE 2.43* 2.82*  CALCIUM 7.7* 6.8*   PT/INR Recent Labs    06/24/21 0247  LABPROT 14.2  INR 1.1   CMP     Component Value Date/Time   NA 142 06/26/2021 0320   K 3.7 06/26/2021 0320   CL 110 06/26/2021 0320   CO2 23 06/26/2021 0320   GLUCOSE  106 (H) 06/26/2021 0320   BUN 53 (H) 06/26/2021 0320   CREATININE 2.82 (H) 06/26/2021 0320   CALCIUM 6.8 (L) 06/26/2021 0320   PROT 7.4 06/24/2021 0247   ALBUMIN 3.1 (L) 06/24/2021 0247   AST 12 (L) 06/24/2021 0247   ALT 11 06/24/2021 0247   ALKPHOS 69 06/24/2021 0247   BILITOT 1.3 (H) 06/24/2021 0247   GFRNONAA 24 (L) 06/26/2021 0320   Lipase     Component Value Date/Time   LIPASE 22 06/23/2021 1109       Studies/Results: No results found.  Anti-infectives: Anti-infectives (From admission, onward)    Start     Dose/Rate Route Frequency Ordered Stop   06/24/21 2200  cefoTEtan (CEFOTAN) 2 g in sodium chloride 0.9 % 100 mL IVPB        2 g 200 mL/hr over 30 Minutes Intravenous Every 12 hours 06/24/21 1514 06/24/21 2109   06/24/21 0830  cefoTEtan (CEFOTAN) 2 g in sodium chloride 0.9 % 100 mL IVPB        2 g 200 mL/hr over 30 Minutes Intravenous On call to O.R. 06/24/21 0733 06/24/21 1117        Assessment/Plan Obstructing sigmoid colon mass POD2 s/p open sigmoid colectomy with end colostomy 06/24/21 Dr. Freida Busman - pathology pending  - having good stool output from colostomy and minimal NGT output - clamp and trial CLD, if tolerating well  will remove NGT this afternoon - needs to mobilize, PT/OT - WOC consult for new colostomy and VAC - drain with SS fluid - continue for now - will start transitioning to PO pain control this afternoon if tolerating CLD - goal of K>4.0 and Mg >2.0 to optimize bowel function   FEN: clamping trial, CLD, IVF per [facility]/nephrology  VTE: SCDs, start SQH today  ID: cefotetan x2 doses  AKI - Cr 2.82 from 0.94 on admit, UOP low, nephrology following  COVID + - per [facility] Morbid Obesity - BMI 49.89 BPH  LOS: 3 days    Juliet Rude, [facility] 06/26/2021, 8:00 AM Please see Amion for pager number during day hours 7:00am-4:30pm

## 2021-06-26 NOTE — Progress Notes (Signed)
PROGRESS NOTE    Travis Calderon  EQA:[id number] DOB: 1956/01/04 DOA: 06/23/2021 PCP: [facility]     Brief Narrative: This 65 years old male with PMH significant for obesity, diverticulosis presented to the ED with complaints of abdominal pain and abnormal bowel movements.  Patient reports for the last several days he has had generalized abdominal pain associated with distention, nausea but denies any vomiting.  He also report over the past several months, he has had abnormal bowel movement,  he describes it as more frequent and does not appear same.  He regularly follows up with PCP,  does not take any take medications routinely. He [place]  He is found to be COVID-positive, CT abdomen shows large obstructing colonic mass.  Patient was started on pain medication. General surgery was consulted. Patient is transferred to [facility] and patient underwent Open sigmoid colectomy with end colostomy.  Postoperative course complicated by ATN,  Nephrology consulted , continued on IV hydration.  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 Colonic Mass: Patient presented with constant abdominal pain, distention and irregular bowel movement for several months. CT abdomen pelvis showed large obstructing colonic mass. He reported having colonoscopy in [place],  he does not know the results. General surgery was consulted.  Patient was admitted at [facility]. Patient underwent open  sigmoid colectomy with end colostomy on 06/24/21 Postoperative course complicated by ileus, recommended NG tube, IV hydration, pain control.. Continue colostomy care and training. Patient has good output in colostomy and minimal output in NG tube.   Recommend NG clamping and clear liquid diet if tolerates well discontinue NGT.  COVID+: Patient is found positive, chest x-ray unremarkable. Patient is asymptomatic at this time. No need to treat with remdesivir or Solu-Medrol.  Acute kidney injury Likely prerenal: Serum creatinine up from 1.01> 2.43 > 2.82 likely prerenal postoperatively. Continue IV hydration, continue to monitor renal functions. CT abdomen no hydronephrosis. Avoid Nephrotoxic medications, nephrology consulted. Recommended to continue IV hydration.  No indication for hemodialysis at this time.  Hypotension:  Continue midodrine 5 mg 3 times daily. Continue IV hydration.  Hypokalemia: Replaced and resolved.  Morbid obesity:  recommend diet and weight loss.   DVT prophylaxis:  SCDs Code Status: Full code. Family Communication: Sisters at bed side. Disposition Plan:   Status is: Inpatient  Remains inpatient appropriate because:Inpatient level of care appropriate due to severity of illness  Dispo: The patient is from: Home              Anticipated d/c is to: Home              Patient currently is not medically stable to d/c.   Difficult to place patient No   Consultants:  General surgery  Procedures: Open sigmoid colectomy with end colostomy Antimicrobials:  Anti-infectives (From admission, onward)    Start     Dose/Rate Route Frequency Ordered Stop   06/24/21 2200  cefoTEtan (CEFOTAN) 2 g in sodium chloride 0.9 %  100 mL IVPB        2 g 200 mL/hr over 30 Minutes Intravenous Every 12 hours 06/24/21 1514 06/24/21 2109   06/24/21 0830  cefoTEtan (CEFOTAN) 2 g in sodium chloride 0.9 % 100 mL IVPB        2 g 200 mL/hr over 30 Minutes Intravenous On call to  O.R. 06/24/21 0733 06/24/21 1117        Subjective: Patient was seen and examined at bedside.  Overnight events noted.   Patient reports abdominal pain is manageable,  denies any nausea and vomiting.   Blood pressure is improving.  He is s/p colectomy with colostomy.  Postoperative day 2.  Objective: Vitals:   06/26/21 0700 06/26/21 0800 06/26/21 0900 06/26/21 1000  BP: 101/63 105/72  103/65  Pulse: (!) 105 (!) 102 100 (!) 103  Resp: 14 15 17 17   Temp:  98.5 F (36.9 C)    TempSrc:  Oral    SpO2: 96% 94% 93% 96%  Weight:      Height:        Intake/Output Summary (Last 24 hours) at 06/26/2021 1105 Last data filed at 06/26/2021 0800 Gross per 24 hour  Intake 2436.56 ml  Output 2675 ml  Net -238.44 ml   Filed Weights   06/23/21 1059 06/23/21 2251 06/24/21 1500  Weight: (!) 145.2 kg (!) 144.5 kg (!) 144.5 kg    Examination:  General exam: Appears calm and comfortable , not in any acute distress. Respiratory system: Clear to auscultation. Respiratory effort normal. Cardiovascular system: S1 & S2 heard, RRR. No JVD, murmurs, rubs, gallops or clicks. No pedal edema. Gastrointestinal system: Abdomen is non distended, soft and tender.   Sluggish bowel sounds.  No guarding, no rigidity.  Wound VAC noted.  Colostomy on the left side noted. Central nervous system: Alert and oriented. No focal neurological deficits. Extremities: Symmetric 5 x 5 power.  1+ pitting edema noted. Skin: No rashes, lesions or ulcers Psychiatry: Judgement and insight appear normal. Mood & affect appropriate.     Data Reviewed: I have personally reviewed following labs and imaging studies  CBC: Recent Labs  Lab 06/23/21 1109 06/24/21 0247 06/25/21 0226 06/26/21 0320  WBC 12.4* 11.0* 7.5 4.4  HGB 16.7 14.8 14.6 12.4*  HCT 48.2 44.9 44.3 38.0*  MCV 87.6 89.8 90.2 90.9  PLT 277 238 238 184   Basic Metabolic Panel: Recent Labs  Lab 06/23/21 1109 06/24/21 0247 06/25/21 0226 06/26/21 0320   NA 138 141 143 142  K 3.4* 3.3* 3.9 3.7  CL 105 106 108 110  CO2 25 25 25 23   GLUCOSE 115* 95 127* 106*  BUN 13 19 30* 53*  CREATININE 0.94 1.01 2.43* 2.82*  CALCIUM 9.0 8.3* 7.7* 6.8*  MG  --  2.1 1.9  --   PHOS  --  3.8 5.3*  --    GFR: Estimated Creatinine Clearance: 36 mL/min (A) (by C-G formula based on SCr of 2.82 mg/dL (H)). Liver Function Tests: Recent Labs  Lab 06/23/21 1109 06/24/21 0247  AST 18 12*  ALT 10 11  ALKPHOS 86 69  BILITOT 0.8 1.3*  PROT 8.9* 7.4  ALBUMIN 3.6 3.1*   Recent Labs  Lab 06/23/21 1109  LIPASE 22   No results for input(s): AMMONIA in the last 168 hours. Coagulation Profile: Recent Labs  Lab 06/24/21 0247  INR 1.1   Cardiac Enzymes: No results for input(s): CKTOTAL, CKMB, CKMBINDEX, TROPONINI in the last 168 hours. BNP (last  3 results) No results for input(s): PROBNP in the last 8760 hours. HbA1C: Recent Labs    06/24/21 0247  HGBA1C 5.1   CBG: No results for input(s): GLUCAP in the last 168 hours. Lipid Profile: No results for input(s): CHOL, HDL, LDLCALC, TRIG, CHOLHDL, LDLDIRECT in the last 72 hours. Thyroid Function Tests: No results for input(s): TSH, T4TOTAL, FREET4, T3FREE, THYROIDAB in the last 72 hours. Anemia Panel: No results for input(s): VITAMINB12, FOLATE, FERRITIN, TIBC, IRON, RETICCTPCT in the last 72 hours. Sepsis Labs: No results for input(s): PROCALCITON, LATICACIDVEN in the last 168 hours.  Recent Results (from the past 240 hour(s))  Resp Panel by RT-PCR (Flu A&B, Covid) Nasopharyngeal Swab     Status: Abnormal   Collection Time: 06/23/21  1:12 PM   Specimen: Nasopharyngeal Swab; Nasopharyngeal(NP) swabs in vial transport medium  Result Value Ref Range Status   SARS Coronavirus 2 by RT PCR POSITIVE (A) NEGATIVE Final    Comment: RESULT CALLED TO, READ BACK BY AND VERIFIED WITH:  COBLE,S RN @1403  06/23/21 EDENSCA (NOTE) SARS-CoV-2 target nucleic acids are DETECTED.  The SARS-CoV-2 RNA is generally  detectable in upper respiratory specimens during the acute phase of infection. Positive results are indicative of the presence of the identified virus, but do not rule out bacterial infection or co-infection with other pathogens not detected by the test. Clinical correlation with patient history and other diagnostic information is necessary to determine patient infection status. The expected result is Negative.  Fact Sheet for Patients: 08/24/21  Fact Sheet for Healthcare Providers: BloggerCourse.com  This test is not yet approved or cleared by the [employer] FDA and  has been authorized for detection and/or diagnosis of SARS-CoV-2 by FDA under an Emergency Use Authorization (EUA).  This EUA will remain in effect (meaning this test can be  used) for the duration of  the COVID-19 declaration under Section 564(b)(1) of the Act, 21 U.S.C. section 360bbb-3(b)(1), unless the authorization is terminated or revoked sooner.     Influenza A by PCR NEGATIVE NEGATIVE Final   Influenza B by PCR NEGATIVE NEGATIVE Final    Comment: (NOTE) The Xpert Xpress SARS-CoV-2/FLU/RSV plus assay is intended as an aid in the diagnosis of influenza from Nasopharyngeal swab specimens and should not be used as a sole basis for treatment. Nasal washings and aspirates are unacceptable for Xpert Xpress SARS-CoV-2/FLU/RSV testing.  Fact Sheet for Patients: [place]  Fact Sheet for Healthcare Providers: BloggerCourse.com  This test is not yet approved or cleared by the [employer]ious[employer] FDA and has been authorized for detection and/or diagnosis of SARS-CoV-2 by FDA under an Emergency Use Authorization (EUA). This EUA will remain in effect (meaning this test can be used) for the duration of the COVID-19 declaration under Section 564(b)(1) of the Act, 21 U.S.C. section 360bbb-3(b)(1), unless the  authorization is terminated or revoked.  Performed at [facility], [address], [place], [place]   Surgical PCR screen     Status: Abnormal   Collection Time: 06/24/21  1:09 AM   Specimen: Nasal Mucosa; Nasal Swab  Result Value Ref Range Status   MRSA, PCR POSITIVE (A) NEGATIVE Final    Comment: RESULT CALLED TO, READ BACK BY AND VERIFIED WITH: SHAW,C RN @0333  ON 06/24/21 JACKSON,K    Staphylococcus aureus POSITIVE (A) NEGATIVE Final    Comment: (NOTE) The Xpert SA Assay (FDA approved for NASAL specimens in patients 64 years of age and older), is one component of a comprehensive  surveillance program. It is not intended to diagnose infection nor to guide or monitor treatment. Performed at [facility], [address] [place], [place] [address]   MRSA Next Gen by PCR, Nasal     Status: Abnormal   Collection Time: 06/24/21  3:38 PM   Specimen: Nasal Mucosa; Nasal Swab  Result Value Ref Range Status   MRSA by PCR Next Gen DETECTED (A) NOT DETECTED Final    Comment: RESULT CALLED TO, READ BACK BY AND VERIFIED WITH: YOUNTS,D. RN AT 1735 06/24/21 MULLINS,T (NOTE) The GeneXpert MRSA Assay (FDA approved for NASAL specimens only), is one component of a comprehensive MRSA colonization surveillance program. It is not intended to diagnose MRSA infection nor to guide or monitor treatment for MRSA infections. Test performance is not FDA approved in patients less than 66 years old. Performed at [facility], [address] [place], [place] [address]     Radiology Studies: No results found.  Scheduled Meds:  chlorhexidine  15 mL Mouth Rinse BID   Chlorhexidine Gluconate Cloth  6 each Topical Daily   heparin injection (subcutaneous)  5,000 Units Subcutaneous Q8H   lip balm  1 application Topical BID   mouth rinse  15 mL Mouth Rinse BID   midodrine  5 mg Oral TID WC   mupirocin ointment  1 application Nasal BID    Continuous Infusions:  sodium chloride 100 mL/hr at 06/26/21 0836     LOS: 3 days    Time spent: 25 mins    Myquan Schaumburg, MD [facility]   If 7PM-7AM, please contact night-coverage

## 2021-06-26 NOTE — Consult Note (Signed)
WOC Nurse ostomy consult note Stoma type/location:  LUQ; end colostomy Stomal assessment/size: pink, moist, with malincott tube in stoma for decompression per surgery notes Peristomal assessment: intact  Treatment options for stomal/peristomal skin: 2" skin barrier ring Output liquid light yellow/brown Ostomy pouching: 2pc. 2 3/4 with 2" skin barrier  Education provided:  [name] is on the cell phone Explained role of ostomy nurse and creation of stoma  Explained stoma characteristics (budded, flush, color, texture, care) Demonstrated pouch change (cutting new skin barrier, measuring stoma, cleaning peristomal skin and stoma, use of barrier ring) Arranged educational session with patient and his sister Burna Mortimer for 7am Wednesday am.    Enrolled patient in [employer] DC program: No   WOC Nurse Consult Note: Reason for Consult: NPWT dressing change  Wound type: surgical  Pressure Injury POA: NA Measurement: 28cm x 5cm x 6cm at deepest point at distal aspect of wound bed Wound bed: subcutaneous tissue; clean; pink; bleeds easily Drainage (amount, consistency, odor) minimal in VAC canister Periwound:intact with colostomy to the left side of the wound  Dressing procedure/placement/frequency:  Filled wound with  _2__ piece of black foam Sealed NPWT dressing at HG Patient received IV pain medication per bedside nurse prior to dressing change Patient tolerated procedure well  WOC nurse will continue to provide NPWT dressing changed due to the complexity of the dressing change.   [facility], CNS, [employer] [phone]

## 2021-06-26 NOTE — Progress Notes (Signed)
Notified [facility] on call provider about patient drop in BP. This is the second time this has happened today. Patient was given fluid bolus on day shift. Patient is 4L positive for fluids, and has had very UOP from foley. BUN/Creat increase since admit. Post-surgical patient not currently on antibiotics. Afebrile, Tachy, soft BP, 02 mid 90's on 4L [place]. Complains of some dizziness, giving bolus now as prescribed. Will continue to assess for change in status. BP checks currently every 15 min. Will also ask about continuing foley so accurate I&O can be recorded.

## 2021-06-27 ENCOUNTER — Inpatient Hospital Stay (HOSPITAL_COMMUNITY): Payer: Medicare Other

## 2021-06-27 DIAGNOSIS — K56609 Unspecified intestinal obstruction, unspecified as to partial versus complete obstruction: Secondary | ICD-10-CM | POA: Diagnosis not present

## 2021-06-27 LAB — BASIC METABOLIC PANEL
Anion gap: 11 (ref 5–15)
BUN: 53 mg/dL — ABNORMAL HIGH (ref 8–23)
CO2: 25 mmol/L (ref 22–32)
Calcium: 7.9 mg/dL — ABNORMAL LOW (ref 8.9–10.3)
Chloride: 110 mmol/L (ref 98–111)
Creatinine, Ser: 1.81 mg/dL — ABNORMAL HIGH (ref 0.61–1.24)
GFR, Estimated: 41 mL/min — ABNORMAL LOW (ref >60)
Glucose, Bld: 118 mg/dL — ABNORMAL HIGH (ref 70–99)
Potassium: 2.7 mmol/L — CL (ref 3.5–5.1)
Sodium: 146 mmol/L — ABNORMAL HIGH (ref 135–145)

## 2021-06-27 LAB — CBC
HCT: 40.1 % (ref 39.0–52.0)
Hemoglobin: 13.3 g/dL (ref 13.0–17.0)
MCH: 29.1 pg (ref 26.0–34.0)
MCHC: 33.2 g/dL (ref 30.0–36.0)
MCV: 87.7 fL (ref 80.0–100.0)
Platelets: 233 10*3/uL (ref 150–400)
RBC: 4.57 MIL/uL (ref 4.22–5.81)
RDW: 14 % (ref 11.5–15.5)
WBC: 4.8 10*3/uL (ref 4.0–10.5)
nRBC: 1 % — ABNORMAL HIGH (ref 0.0–0.2)

## 2021-06-27 LAB — MAGNESIUM: Magnesium: 2.4 mg/dL (ref 1.7–2.4)

## 2021-06-27 LAB — SURGICAL PATHOLOGY

## 2021-06-27 LAB — PHOSPHORUS: Phosphorus: 2.5 mg/dL (ref 2.5–4.6)

## 2021-06-27 MED ORDER — POTASSIUM CHLORIDE CRYS ER 20 MEQ PO TBCR
40.0000 meq | EXTENDED_RELEASE_TABLET | Freq: Two times a day (BID) | ORAL | Status: AC
  Filled 2021-06-27: qty 2

## 2021-06-27 MED ORDER — POTASSIUM CHLORIDE CRYS ER 20 MEQ PO TBCR: 40.0000 meq | EXTENDED_RELEASE_TABLET | Freq: Once | ORAL | Status: DC

## 2021-06-27 MED ORDER — POTASSIUM CHLORIDE 10 MEQ/100ML IV SOLN
10.0000 meq | INTRAVENOUS | Status: AC
  Administered 2021-06-27 (×6): 10 meq via INTRAVENOUS
  Filled 2021-06-27 (×6): qty 100

## 2021-06-27 MED ORDER — POTASSIUM CHLORIDE 10 MEQ/100ML IV SOLN
10.0000 meq | INTRAVENOUS | Status: AC
  Administered 2021-06-27 – 2021-06-28 (×4): 10 meq via INTRAVENOUS
  Filled 2021-06-27 (×4): qty 100

## 2021-06-27 MED ORDER — DEXTROSE-NACL 5-0.2 % IV SOLN: INTRAVENOUS | Status: DC

## 2021-06-27 MED ORDER — ACETAMINOPHEN 10 MG/ML IV SOLN
1000.0000 mg | Freq: Four times a day (QID) | INTRAVENOUS | Status: AC
  Administered 2021-06-27 – 2021-06-28 (×4): 1000 mg via INTRAVENOUS
  Filled 2021-06-27 (×4): qty 100

## 2021-06-27 MED ORDER — POTASSIUM CHLORIDE CRYS ER 20 MEQ PO TBCR: 40.0000 meq | EXTENDED_RELEASE_TABLET | Freq: Three times a day (TID) | ORAL | Status: DC

## 2021-06-27 MED ORDER — FAMOTIDINE IN NACL 20-0.9 MG/50ML-% IV SOLN
20.0000 mg | INTRAVENOUS | Status: DC
  Administered 2021-06-27 – 2021-07-03 (×7): 20 mg via INTRAVENOUS
  Filled 2021-06-27 (×7): qty 50

## 2021-06-27 NOTE — Progress Notes (Signed)
PROGRESS NOTE    Travis Calderon  XIH:[id number] DOB: 05/25/1956 DOA: 06/23/2021 PCP: [facility]     Brief Narrative: This 65 years old male with PMH significant for obesity, diverticulosis presented to the ED with complaints of abdominal pain and abnormal bowel movements.  Patient reports for the last several days he has had generalized abdominal pain associated with distention, nausea but denies any vomiting.  He also report over the past several months, he has had abnormal bowel movement,  he describes it as more frequent and does not appear same.  He regularly follows up with PCP,  does not take any take medications routinely. He had [place] in [place]  He is found to be COVID-positive, CT abdomen shows large obstructing colonic mass.  Patient was started on pain medication. General surgery was consulted. Patient is transferred to [facility] and patient underwent Open sigmoid colectomy with end colostomy.  Postoperative course complicated by ATN,  Nephrology consulted , continued on IV hydration.  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 Colonic Mass: Patient presented with constant abdominal pain, distention and irregular bowel movement for several months. CT abdomen pelvis showed large obstructing colonic mass. He reported having colonoscopy in 2019 [place],  he does not know the results. General surgery was consulted.  Patient was admitted at [facility]. Patient underwent open  sigmoid colectomy with end colostomy on 06/24/21 Postoperative course complicated by ileus, recommended NG tube, IV hydration, pain control.. Continue colostomy care and training. Patient has good output in colostomy and minimal output in NG tube.   NG tube was removed and started on clear liquid diet but patient has emesis overnight. Continue NPO, check KUB,   COVID+: Patient is found positive, chest x-ray unremarkable. Patient was asymptomatic at admission. No need to treat with remdesivir or Solu-Medrol. Patient has briefly required oxygen post op and now back to room air.  Acute kidney injury Likely prerenal: Serum creatinine up from 1.01> 2.43 > 2.82 likely prerenal postoperatively. Continue IV hydration, continue to monitor renal functions. CT abdomen no hydronephrosis. Avoid Nephrotoxic medications, nephrology consulted. No indication for hemodialysis at this time. Serum creatinine improving.2.43>2.82> 1.81  Hypotension:  Continue midodrine 5 mg 3 times daily. Continue IV hydration.  Blood pressure has improved.  Hypokalemia: Replacement in progress.  Continue to monitor.  Morbid obesity:  recommend diet and weight loss.   DVT prophylaxis:  SCDs Code Status: Full code. Family Communication: Sisters at bed side. Disposition Plan:   Status is: Inpatient  Remains inpatient appropriate because:Inpatient level of care appropriate due to severity of illness  Dispo: The patient is from: Home              Anticipated d/c is to: Home              Patient currently is not medically stable to d/c.   Difficult to place patient No   Consultants:  General surgery  Procedures: Open sigmoid colectomy with end colostomy Antimicrobials:  Anti-infectives (From admission,  onward)    Start     Dose/Rate Route Frequency Ordered Stop   06/24/21 2200  cefoTEtan (CEFOTAN) 2 g in sodium chloride 0.9 % 100 mL IVPB        2 g 200 mL/hr over 30 Minutes Intravenous Every 12 hours  06/24/21 1514 06/24/21 2109   06/24/21 0830  cefoTEtan (CEFOTAN) 2 g in sodium chloride 0.9 % 100 mL IVPB        2 g 200 mL/hr over 30 Minutes Intravenous On call to O.R. 06/24/21 0733 06/24/21 1117        Subjective: Patient was seen and examined at bedside.  Overnight events noted.   He was sitting on the chair with his legs elevated. Patient reports abdominal pain is manageable, reports overnight threw up several times. Blood pressure is improving.  He is s/p colectomy with colostomy.  Postoperative day 3.  Objective: Vitals:   06/27/21 0945 06/27/21 1000 06/27/21 1100 06/27/21 1125  BP:  (!) 137/95 (!) 147/99   Pulse: (!) 101 99 97   Resp: 20 18 (!) 26 (!) 26  Temp:    97.9 F (36.6 C)  TempSrc:    Oral  SpO2: 97% 96% 93%   Weight:      Height:        Intake/Output Summary (Last 24 hours) at 06/27/2021 1204 Last data filed at 06/27/2021 1125 Gross per 24 hour  Intake 2198.82 ml  Output 4690 ml  Net -2491.18 ml   Filed Weights   06/23/21 1059 06/23/21 2251 06/24/21 1500  Weight: (!) 145.2 kg (!) 144.5 kg (!) 144.5 kg    Examination:  General exam: Appears calm and comfortable , not in any acute distress. Respiratory system: Clear to auscultation. Respiratory effort normal. Cardiovascular system: S1 & S2 heard, RRR. No JVD, murmurs, rubs, gallops or clicks. No pedal edema. Gastrointestinal system: Abdomen is non distended, soft and tender.   Sluggish bowel sounds.  No guarding, no rigidity.  Wound VAC noted.  Colostomy on the left side noted. Central nervous system: Alert and oriented. No focal neurological deficits. Extremities: Symmetric 5 x 5 power.  1+ pitting edema noted. Skin: No rashes, lesions or ulcers Psychiatry: Judgement and insight appear normal. Mood & affect appropriate.     Data Reviewed: I have personally reviewed following labs and imaging studies  CBC: Recent Labs  Lab 06/23/21 1109 06/24/21 0247 06/25/21 0226 06/26/21 0320  06/27/21 0302  WBC 12.4* 11.0* 7.5 4.4 4.8  HGB 16.7 14.8 14.6 12.4* 13.3  HCT 48.2 44.9 44.3 38.0* 40.1  MCV 87.6 89.8 90.2 90.9 87.7  PLT 277 238 238 184 233   Basic Metabolic Panel: Recent Labs  Lab 06/23/21 1109 06/24/21 0247 06/25/21 0226 06/26/21 0320 06/27/21 0302  NA 138 141 143 142 146*  K 3.4* 3.3* 3.9 3.7 2.7*  CL 105 106 108 110 110  CO2 25 25 25 23 25   GLUCOSE 115* 95 127* 106* 118*  BUN 13 19 30* 53* 53*  CREATININE 0.94 1.01 2.43* 2.82* 1.81*  CALCIUM 9.0 8.3* 7.7* 6.8* 7.9*  MG  --  2.1 1.9  --  2.4  PHOS  --  3.8 5.3*  --  2.5   GFR: Estimated Creatinine Clearance: 56.1 mL/min (A) (by C-G formula based on SCr of 1.81 mg/dL (H)). Liver Function Tests: Recent Labs  Lab 06/23/21 1109 06/24/21 0247  AST 18 12*  ALT 10 11  ALKPHOS 86 69  BILITOT 0.8 1.3*  PROT 8.9* 7.4  ALBUMIN 3.6 3.1*   Recent Labs  Lab 06/23/21 1109  LIPASE 22   No results for input(s): AMMONIA in the last 168 hours. Coagulation Profile: Recent Labs  Lab 06/24/21 0247  INR 1.1   Cardiac Enzymes: No results for input(s): CKTOTAL, CKMB, CKMBINDEX, TROPONINI in the last 168 hours. BNP (last 3 results) No results for input(s): PROBNP in the last 8760 hours. HbA1C: No results for input(s): HGBA1C in the last 72 hours.  CBG: No results for input(s): GLUCAP in the last 168 hours. Lipid Profile: No results for input(s): CHOL, HDL, LDLCALC, TRIG, CHOLHDL, LDLDIRECT in the last 72 hours. Thyroid Function Tests: No results for input(s): TSH, T4TOTAL, FREET4, T3FREE, THYROIDAB in the last 72 hours. Anemia Panel: No results for input(s): VITAMINB12, FOLATE, FERRITIN, TIBC, IRON, RETICCTPCT in the last 72 hours. Sepsis Labs: No results for input(s): PROCALCITON, LATICACIDVEN in the last 168 hours.  Recent Results (from the past 240 hour(s))  Resp Panel by RT-PCR (Flu A&B, Covid) Nasopharyngeal Swab     Status: Abnormal   Collection Time: 06/23/21  1:12 PM   Specimen:  Nasopharyngeal Swab; Nasopharyngeal(NP) swabs in vial transport medium  Result Value Ref Range Status   SARS Coronavirus 2 by RT PCR POSITIVE (A) NEGATIVE Final    Comment: RESULT CALLED TO, READ BACK BY AND VERIFIED WITH:  COBLE,S RN @1403  06/23/21 EDENSCA (NOTE) SARS-CoV-2 target nucleic acids are DETECTED.  The SARS-CoV-2 RNA is generally detectable in upper respiratory specimens during the acute phase of infection. Positive results are indicative of the presence of the identified virus, but do not rule out bacterial infection or co-infection with other pathogens not detected by the test. Clinical correlation with patient history and other diagnostic information is necessary to determine patient infection status. The expected result is Negative.  Fact Sheet for Patients: BloggerCourse.comhttps://www.fda.gov/media/152166/download  Fact Sheet for Healthcare Providers: SeriousBroker.ithttps://www.fda.gov/media/152162/download  This test is not yet approved or cleared by the [place] FDA and  has been authorized for detection and/or diagnosis of SARS-CoV-2 by FDA under an Emergency Use Authorization (EUA).  This EUA will remain in effect (meaning this test can be  used) for the duration of  the COVID-19 declaration under Section 564(b)(1) of the Act, 21 U.S.C. section 360bbb-3(b)(1), unless the authorization is terminated or revoked sooner.     Influenza A by PCR NEGATIVE NEGATIVE Final   Influenza B by PCR NEGATIVE NEGATIVE Final    Comment: (NOTE) The Xpert Xpress SARS-CoV-2/FLU/RSV plus assay is intended as an aid in the diagnosis of influenza from Nasopharyngeal swab specimens and should not be used as a sole basis for treatment. Nasal washings and aspirates are unacceptable for Xpert Xpress SARS-CoV-2/FLU/RSV testing.  Fact Sheet for Patients: BloggerCourse.comhttps://www.fda.gov/media/152166/download  Fact Sheet for Healthcare Providers: SeriousBroker.ithttps://www.fda.gov/media/152162/download  This test is not yet approved or  cleared by the [place] FDA and has been authorized for detection and/or diagnosis of SARS-CoV-2 by FDA under an Emergency Use Authorization (EUA). This EUA will remain in effect (meaning this test can be used) for the duration of the COVID-19 declaration under Section 564(b)(1) of the Act, 21 U.S.C. section 360bbb-3(b)(1), unless the authorization is terminated or revoked.  Performed at [facility], [address] [place], [place] [id number]   Surgical PCR screen     Status: Abnormal   Collection Time: 06/24/21  1:09 AM   Specimen: Nasal Mucosa; Nasal Swab  Result Value Ref Range Status   MRSA, PCR POSITIVE (A) NEGATIVE Final  Comment: RESULT CALLED TO, READ BACK BY AND VERIFIED WITH: SHAW,C RN @0333  ON 06/24/21 JACKSON,K    Staphylococcus aureus POSITIVE (A) NEGATIVE Final    Comment: (NOTE) The Xpert SA Assay (FDA approved for NASAL specimens in patients 43 years of age and older), is one component of a comprehensive surveillance program. It is not intended to diagnose infection nor to guide or monitor treatment. Performed at [facility], [address], [place], [place]   MRSA Next Gen by PCR, Nasal     Status: Abnormal   Collection Time: 06/24/21  3:38 PM   Specimen: Nasal Mucosa; Nasal Swab  Result Value Ref Range Status   MRSA by PCR Next Gen DETECTED (A) NOT DETECTED Final    Comment: RESULT CALLED TO, READ BACK BY AND VERIFIED WITH: YOUNTS,D. RN AT 1735 06/24/21 MULLINS,T (NOTE) The GeneXpert MRSA Assay (FDA approved for NASAL specimens only), is one component of a comprehensive MRSA colonization surveillance program. It is not intended to diagnose MRSA infection nor to guide or monitor treatment for MRSA infections. Test performance is not FDA approved in patients less than 57 years old. Performed at [facility], [address], [place], [place]     Radiology Studies: DG Abd Portable  1V  Result Date: 06/27/2021 CLINICAL DATA:  Colonic obstruction post colostomy with 08/28/2021 pouch on 06/24/2021, abdominal pain, nausea, vomiting EXAM: PORTABLE ABDOMEN - 1 VIEW COMPARISON:  Portable exam 0832 hours compared to CT abdomen and pelvis of 06/23/2021 FINDINGS: Slight gaseous distention of stomach. Air-filled mildly dilated loops of small bowel throughout abdomen question postoperative ileus. G-tube in LEFT upper quadrant. Paucity of colonic gas. No definite bowel wall thickening. Osseous structures unremarkable. IMPRESSION: Air-filled mildly dilated small bowel loops question postoperative ileus. Electronically Signed   By: 08/24/2021 M.D.   On: 06/27/2021 11:45    Scheduled Meds:  chlorhexidine  15 mL Mouth Rinse BID   Chlorhexidine Gluconate Cloth  6 each Topical Daily   docusate sodium  100 mg Oral BID   heparin injection (subcutaneous)  5,000 Units Subcutaneous Q8H   lip balm  1 application Topical BID   mouth rinse  15 mL Mouth Rinse BID   melatonin  3 mg Oral QHS   midodrine  5 mg Oral TID WC   mupirocin ointment  1 application Nasal BID   potassium chloride  40 mEq Oral BID   Continuous Infusions:  sodium chloride 75 mL/hr at 06/27/21 1125   acetaminophen 1,000 mg (06/27/21 1128)   famotidine (PEPCID) IV Stopped (06/27/21 0912)   potassium chloride 100 mL/hr at 06/27/21 1125     LOS: 4 days    Time spent: 25 mins    Kathrine Rieves, MD [facility]   If 7PM-7AM, please contact night-coverage

## 2021-06-27 NOTE — Progress Notes (Signed)
[facility] Progress Note  Subjective: very good UOP and creat down 1.8  Vitals:   06/27/21 1200 06/27/21 1300 06/27/21 1400 06/27/21 1500  BP: 124/72 (!) 145/99 (!) 151/88 (!) 151/91  Pulse: 99 86 86 90  Resp: 15     Temp:      TempSrc:      SpO2: 94% 95% 90% 92%  Weight:      Height:        Exam: General:  adult male in bed  Neck no JVD Heart: tachycardic, S1S2 no rub Lungs: clear but reduced on auscultation; on 3 liters oxygen nasal cannula Abdomen: softly distended. Wound vac in place Extremities: no pitting edema.  No cyanosis or clubbing Neuro: alert and oriented x 3 provides a hx and follows commands GU - foley catheter in place      UA many bact, >50 rbc, 6-10 wbc, 30 prot    UPC ration 0.28, wnl      Assessment/Plan:   # AKI-  ischemic ATN in the setting of hypotension as well as pre-renal losses.  No hydro on CT scan.  He got contrast on 7/8 but had normal GFR at that time so not the culprit.  UA with 30 mg/dL protein prior to his hypotension it appears. UOP the last 48 hrs very good and creat down today to 1.8 indicating AKI recovery. Will adjust fluids for high Na+ levels , would continue as long as GI losses are significant. No further suggestions, will sign off.    # Hypotension - will dc midodrine can resume if needed.    # Colonic mass - s/p Open sigmoid colectomy with end colostomy on 7/9. Colostomy output > 1 L yesterday.    # Covid positive - per primary team   # Acute hypoxic respiratory failure - on supplemental oxygen. CXR 7/08 wnl.       Rob Danuta Huseman 06/27/2021, 5:58 PM   Recent Labs  Lab 06/25/21 0226 06/26/21 0320 06/27/21 0302  K 3.9 3.7 2.7*  BUN 30* 53* 53*  CREATININE 2.43* 2.82* 1.81*  CALCIUM 7.7* 6.8* 7.9*  PHOS 5.3*  --  2.5  HGB 14.6 12.4* 13.3    Inpatient medications:  chlorhexidine  15 mL Mouth Rinse BID   Chlorhexidine Gluconate Cloth  6 each Topical Daily   docusate sodium  100 mg Oral BID    heparin injection (subcutaneous)  5,000 Units Subcutaneous Q8H   lip balm  1 application Topical BID   mouth rinse  15 mL Mouth Rinse BID   melatonin  3 mg Oral QHS   midodrine  5 mg Oral TID WC   mupirocin ointment  1 application Nasal BID   potassium chloride  40 mEq Oral BID    acetaminophen Stopped (06/27/21 1702)   dextrose 5 % and 0.2 % NaCl     famotidine (PEPCID) IV Stopped (06/27/21 0912)   albuterol, diphenhydrAMINE, HYDROmorphone (DILAUDID) injection, magic mouthwash, menthol-cetylpyridinium, metoprolol tartrate, ondansetron **OR** ondansetron (ZOFRAN) IV, oxyCODONE, phenol, polyethylene glycol, prochlorperazine, simethicone

## 2021-06-27 NOTE — Progress Notes (Signed)
Patient requested simethicone, writer requested it from pharmacy. While waiting for medication to come up patient said he did not want to swallow anything. Simethicone on unit when patient requests it again

## 2021-06-27 NOTE — Progress Notes (Signed)
Progress Note  3 Days Post-Op  Subjective: Patient reportedly had large volume emesis overnight and was having a lot of nausea. Patient's sister dissatisfied with nursing care overnight. Patient reports he is still having some nausea this AM but reports it is related to reflux. UOP increased. Patient hoping to get out of bed more today.   Objective: Vital signs in last 24 hours: Temp:  [98.2 F (36.8 C)-99 F (37.2 C)] 98.5 F (36.9 C) (07/12 0400) Pulse Rate:  [88-109] 95 (07/12 0600) Resp:  [14-25] 25 (07/12 0600) BP: (102-146)/(58-111) 120/68 (07/12 0600) SpO2:  [93 %-100 %] 94 % (07/12 0600) Last BM Date: 06/26/21  Intake/Output from previous day: 07/11 0701 - 07/12 0700 In: 1495.1 [I.V.:1495.1] Out: 4565 [Urine:3200; Emesis/NG output:25; Drains:45; Stool:1295] Intake/Output this shift: No intake/output data recorded.  PE: General: pleasant, WD, morbidly obese male who is laying in bed in NAD Heart: RRR.  Normal s1,s2. No obvious murmurs, gallops, or rubs noted.   Lungs: CTAB, no wheezes, rhonchi, or rales noted.  Respiratory effort nonlabored Abd: soft, appropriately ttp, drain in RUQ with ss fluid, VAC to midline wound, colostomy in LLQ with small amount liquid stool and malecot tube present, spit up ~30 cc clear fluid  Neuro: Cranial nerves 2-12 grossly intact, sensation is normal throughout Psych: A&Ox3 with an appropriate affect.   Lab Results:  Recent Labs    06/26/21 0320 06/27/21 0302  WBC 4.4 4.8  HGB 12.4* 13.3  HCT 38.0* 40.1  PLT 184 233   BMET Recent Labs    06/26/21 0320 06/27/21 0302  NA 142 146*  K 3.7 2.7*  CL 110 110  CO2 23 25  GLUCOSE 106* 118*  BUN 53* 53*  CREATININE 2.82* 1.81*  CALCIUM 6.8* 7.9*   PT/INR No results for input(s): LABPROT, INR in the last 72 hours. CMP     Component Value Date/Time   NA 146 (H) 06/27/2021 0302   K 2.7 (LL) 06/27/2021 0302   CL 110 06/27/2021 0302   CO2 25 06/27/2021 0302   GLUCOSE 118  (H) 06/27/2021 0302   BUN 53 (H) 06/27/2021 0302   CREATININE 1.81 (H) 06/27/2021 0302   CALCIUM 7.9 (L) 06/27/2021 0302   PROT 7.4 06/24/2021 0247   ALBUMIN 3.1 (L) 06/24/2021 0247   AST 12 (L) 06/24/2021 0247   ALT 11 06/24/2021 0247   ALKPHOS 69 06/24/2021 0247   BILITOT 1.3 (H) 06/24/2021 0247   GFRNONAA 41 (L) 06/27/2021 0302   Lipase     Component Value Date/Time   LIPASE 22 06/23/2021 1109       Studies/Results: No results found.  Anti-infectives: Anti-infectives (From admission, onward)    Start     Dose/Rate Route Frequency Ordered Stop   06/24/21 2200  cefoTEtan (CEFOTAN) 2 g in sodium chloride 0.9 % 100 mL IVPB        2 g 200 mL/hr over 30 Minutes Intravenous Every 12 hours 06/24/21 1514 06/24/21 2109   06/24/21 0830  cefoTEtan (CEFOTAN) 2 g in sodium chloride 0.9 % 100 mL IVPB        2 g 200 mL/hr over 30 Minutes Intravenous On call to O.R. 06/24/21 0733 06/24/21 1117        Assessment/Plan Obstructing sigmoid colon mass POD3 s/p open sigmoid colectomy with end colostomy 06/24/21 Dr. Freida Busman - pathology pending - NGT removed yesterday and patient started on CLD - had a lot of n/v overnight, check KUB this AM, NPO - needs  to mobilize, PT/OT - WOC following for new colostomy and VAC - drain with SS fluid - continue for now - pain well controlled currently  - goal of K>4.0 and Mg >2.0 to optimize bowel function   FEN: NPO, IVF per TRH/nephrology VTE: SCDs, SQH ID: cefotetan x2 doses   AKI - Cr 1.8 this AM, improving, nephrology following  COVID + - per TRH Morbid Obesity - BMI 49.89 BPH  LOS: 4 days    Juliet Rude, [facility] 06/27/2021, 7:59 AM Please see Amion for pager number during day hours 7:00am-4:30pm

## 2021-06-27 NOTE — Progress Notes (Signed)
    BRIEF OVERNIGHT PROGRESS REPORT  Notified by RN for concern of patient being unable to ingest PO medication and particularly potassium which also corresponds to Nephrology Note of intent at maintaining K level at or above 4. Patient was given 6 x IVPB (10 mEq) this AM for low level of 2.7. With a goal of 4 for this and the patient inability to maintain PO intake.  IVPB x 4 of is ordered in light of no ingestion of PO potassium  today and the stated goal.  Morning lab draw pending.  Chinita Greenland MSN MSNA ACNPC-AG Acute Care Nurse Practitioner [facility]

## 2021-06-28 DIAGNOSIS — K56609 Unspecified intestinal obstruction, unspecified as to partial versus complete obstruction: Secondary | ICD-10-CM | POA: Diagnosis not present

## 2021-06-28 LAB — MAGNESIUM: Magnesium: 2.7 mg/dL — ABNORMAL HIGH (ref 1.7–2.4)

## 2021-06-28 LAB — BASIC METABOLIC PANEL
Anion gap: 11 (ref 5–15)
BUN: 57 mg/dL — ABNORMAL HIGH (ref 8–23)
CO2: 21 mmol/L — ABNORMAL LOW (ref 22–32)
Calcium: 8.1 mg/dL — ABNORMAL LOW (ref 8.9–10.3)
Chloride: 112 mmol/L — ABNORMAL HIGH (ref 98–111)
Creatinine, Ser: 2.09 mg/dL — ABNORMAL HIGH (ref 0.61–1.24)
GFR, Estimated: 34 mL/min — ABNORMAL LOW (ref >60)
Glucose, Bld: 120 mg/dL — ABNORMAL HIGH (ref 70–99)
Potassium: 3.7 mmol/L (ref 3.5–5.1)
Sodium: 144 mmol/L (ref 135–145)

## 2021-06-28 LAB — CBC
HCT: 39.6 % (ref 39.0–52.0)
Hemoglobin: 13.1 g/dL (ref 13.0–17.0)
MCH: 29.2 pg (ref 26.0–34.0)
MCHC: 33.1 g/dL (ref 30.0–36.0)
MCV: 88.4 fL (ref 80.0–100.0)
Platelets: 227 10*3/uL (ref 150–400)
RBC: 4.48 MIL/uL (ref 4.22–5.81)
RDW: 14.2 % (ref 11.5–15.5)
WBC: 7.8 10*3/uL (ref 4.0–10.5)
nRBC: 2 % — ABNORMAL HIGH (ref 0.0–0.2)

## 2021-06-28 LAB — PHOSPHORUS: Phosphorus: 2.3 mg/dL — ABNORMAL LOW (ref 2.5–4.6)

## 2021-06-28 MED ORDER — K PHOS MONO-SOD PHOS DI & MONO 155-852-130 MG PO TABS
250.0000 mg | ORAL_TABLET | Freq: Two times a day (BID) | ORAL | Status: DC
  Filled 2021-06-28: qty 1

## 2021-06-28 MED ORDER — POTASSIUM CHLORIDE 10 MEQ/100ML IV SOLN
10.0000 meq | INTRAVENOUS | Status: DC
  Administered 2021-06-28 (×2): 10 meq via INTRAVENOUS
  Filled 2021-06-28 (×3): qty 100

## 2021-06-28 MED ORDER — POTASSIUM PHOSPHATES 15 MMOLE/5ML IV SOLN
20.0000 mmol | Freq: Once | INTRAVENOUS | Status: AC
  Administered 2021-06-28: 20 mmol via INTRAVENOUS
  Filled 2021-06-28: qty 6.67

## 2021-06-28 NOTE — Progress Notes (Signed)
4 Days Post-Op   Chief Complaint/Subjective: No nausea or vomiting overnight  Review of Systems See above, otherwise other systems negative   Objective: Vital signs in last 24 hours: Temp:  [97.7 F (36.5 C)-99.3 F (37.4 C)] 97.7 F (36.5 C) (07/13 0800) Pulse Rate:  [83-99] 83 (07/13 0900) Resp:  [15-26] 21 (07/13 0900) BP: (121-159)/(72-103) 122/80 (07/13 0900) SpO2:  [90 %-97 %] 92 % (07/13 0900) Last BM Date: 06/27/21 Intake/Output from previous day: 07/12 0701 - 07/13 0700 In: 3089.2 [I.V.:1864.4; IV Piggyback:1224.7] Out: 2440 [Urine:1450; Drains:15; Stool:975] Intake/Output this shift: Total I/O In: 101.9 [I.V.:101.9] Out: 200 [Stool:200]  PE: Gen: NAd Resp: nonlabored, on oxygen Card: RRR Abd: with obesity, ostomy with some liquid stool in bag, wound vac in place  Lab Results:  Recent Labs    06/27/21 0302 06/28/21 0251  WBC 4.8 7.8  HGB 13.3 13.1  HCT 40.1 39.6  PLT 233 227   BMET Recent Labs    06/27/21 0302 06/28/21 0251  NA 146* 144  K 2.7* 3.7  CL 110 112*  CO2 25 21*  GLUCOSE 118* 120*  BUN 53* 57*  CREATININE 1.81* 2.09*  CALCIUM 7.9* 8.1*   PT/INR No results for input(s): LABPROT, INR in the last 72 hours. CMP     Component Value Date/Time   NA 144 06/28/2021 0251   K 3.7 06/28/2021 0251   CL 112 (H) 06/28/2021 0251   CO2 21 (L) 06/28/2021 0251   GLUCOSE 120 (H) 06/28/2021 0251   BUN 57 (H) 06/28/2021 0251   CREATININE 2.09 (H) 06/28/2021 0251   CALCIUM 8.1 (L) 06/28/2021 0251   PROT 7.4 06/24/2021 0247   ALBUMIN 3.1 (L) 06/24/2021 0247   AST 12 (L) 06/24/2021 0247   ALT 11 06/24/2021 0247   ALKPHOS 69 06/24/2021 0247   BILITOT 1.3 (H) 06/24/2021 0247   GFRNONAA 34 (L) 06/28/2021 0251   Lipase     Component Value Date/Time   LIPASE 22 06/23/2021 1109    Studies/Results: DG Abd Portable 1V  Result Date: 06/27/2021 CLINICAL DATA:  Colonic obstruction post colostomy with Hartmann pouch on 06/24/2021, abdominal  pain, nausea, vomiting EXAM: PORTABLE ABDOMEN - 1 VIEW COMPARISON:  Portable exam 0832 hours compared to CT abdomen and pelvis of 06/23/2021 FINDINGS: Slight gaseous distention of stomach. Air-filled mildly dilated loops of small bowel throughout abdomen question postoperative ileus. G-tube in LEFT upper quadrant. Paucity of colonic gas. No definite bowel wall thickening. Osseous structures unremarkable. IMPRESSION: Air-filled mildly dilated small bowel loops question postoperative ileus. Electronically Signed   By: Ulyses Southward M.D.   On: 06/27/2021 11:45    Anti-infectives: Anti-infectives (From admission, onward)    Start     Dose/Rate Route Frequency Ordered Stop   06/24/21 2200  cefoTEtan (CEFOTAN) 2 g in sodium chloride 0.9 % 100 mL IVPB        2 g 200 mL/hr over 30 Minutes Intravenous Every 12 hours 06/24/21 1514 06/24/21 2109   06/24/21 0830  cefoTEtan (CEFOTAN) 2 g in sodium chloride 0.9 % 100 mL IVPB        2 g 200 mL/hr over 30 Minutes Intravenous On call to O.R. 06/24/21 0733 06/24/21 1117       Assessment/Plan  s/p Procedure(s): COLECTOMY WITH COLOSTOMY CREATION/HARTMANN PROCEDURE, SIGMOID 06/24/2021  -malencot removed -discussed importance of ambulating/working with PT -ok to transfer to floor from surgical standpoint -discussed pathology with patient and family  FEN - water and ice chips VTE - heparin  [place] ID - completed antibiotics Disposition - ok to transfer to floor, continue to work on mobilization   LOS: 5 days   De Blanch [facility] 06/28/2021, 10:09 AM Please see Amion for pager number during day hours 7:00am-4:30pm or 7:00am -11:30am on weekends

## 2021-06-28 NOTE — Progress Notes (Signed)
PROGRESS NOTE    Travis Calderon  XBM:[id number] DOB: January 09, 1956 DOA: 06/23/2021 PCP: [facility]     Brief Narrative: This 65 years old male with PMH significant for obesity, diverticulosis presented to the ED with complaints of abdominal pain and abnormal bowel movements.  Patient reports for the last several days he has had generalized abdominal pain associated with distention, nausea but denies any vomiting.  He also report over the past several months, he has had abnormal bowel movement,  he describes it as more frequent and does not appear same.  He regularly follows up with PCP,  does not take any take medications routinely. He [place]  He is found to be COVID-positive, CT abdomen shows large obstructing colonic mass.  Patient was started on pain medication. General surgery was consulted. Patient is [facility] and patient underwent Open sigmoid colectomy with end colostomy.  Postoperative course complicated by ATN,  Nephrology consulted , continued on IV hydration.  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 Colonic Mass/POA, ileus: CT abdomen pelvis showed large obstructing colonic mass. He reported having colon[place] in [place],  he does not know the results. General surgery was consulted. Patient underwent open sigmoid colectomy with end colostomy on 06/24/21. Postoperative course complicated by ileus, recommended NG tube, IV hydration, pain  control.. Continue colostomy care and training. Patient has good output in colostomy.  NG tube was removed and started on clear liquid diet but patient has emesis overnight yesterday and he was put back to n.p.o.  General surgery on board and managing this so we will defer to them.  Stable enough to be transferred to medical floor per general surgery.  COVID+: Incidental positive, chest x-ray unremarkable.  Remains asymptomatic.  No indication for treatment. Patient has briefly required oxygen post op and now back to room air.  Acute kidney injury Likely prerenal: Serum creatinine up from 1.01> 2.43 > 2.82> 1.81> 2.09 likely prerenal postoperatively.  CT abdomen with no hydronephrosis.  Continue IV hydration and defer further management to nephrology.  Hypotension:  Blood pressure controlled.  Continue midodrine 5 mg 3 times daily.  Hypokalemia: Resolved and 3.7 today however nephrology recommends keeping it over 4 so we will replace some more potassium.  Morbid obesity:  recommend diet and weight loss.  Hypophosphatemia: We will replace  DVT prophylaxis:  SCDs Code Status: Full code. Family Communication: None at bedside.  Patient alert and oriented. Disposition Plan:   Status is: Inpatient  Remains inpatient appropriate because:Inpatient level of care appropriate due to severity of illness  Dispo: The patient is from: Home              Anticipated d/c is to: Home VS SNF, to be assessed by PT OT              Patient currently is not medically stable to d/c.   Difficult to place patient No   Consultants:  General surgery Nephrology  Procedures: Open sigmoid  colectomy with end colostomy Antimicrobials:  Anti-infectives (From admission, onward)    Start     Dose/Rate Route Frequency Ordered Stop   06/24/21 2200  cefoTEtan (CEFOTAN) 2 g in sodium chloride 0.9 % 100 mL IVPB        2 g 200 mL/hr over 30 Minutes Intravenous Every 12 hours 06/24/21 1514 06/24/21 2109    06/24/21 0830  cefoTEtan (CEFOTAN) 2 g in sodium chloride 0.9 % 100 mL IVPB        2 g 200 mL/hr over 30 Minutes Intravenous On call to O.R. 06/24/21 2542 06/24/21 1117        Subjective: Seen and examined.  Feels well.  No pain.  No shortness of breath.  Happy to know that his pathology is negative for cancer.  Objective: Vitals:   06/28/21 0600 06/28/21 0700 06/28/21 0800 06/28/21 0900  BP: 121/85 128/80 131/87 122/80  Pulse: 91 90 91 83  Resp: (!) 24 (!) 21 (!) 26 (!) 21  Temp:   97.7 F (36.5 C)   TempSrc:   Oral   SpO2: 93% 96% 97% 92%  Weight:      Height:        Intake/Output Summary (Last 24 hours) at 06/28/2021 1045 Last data filed at 06/28/2021 1000 Gross per 24 hour  Intake 2934.72 ml  Output 2990 ml  Net -55.28 ml    Filed Weights   06/23/21 1059 06/23/21 2251 06/24/21 1500  Weight: (!) 145.2 kg (!) 144.5 kg (!) 144.5 kg    Examination:  General exam: Appears calm and comfortable, morbidly obese Respiratory system: Clear to auscultation. Respiratory effort normal. Cardiovascular system: S1 & S2 heard, RRR. No JVD, murmurs, rubs, gallops or clicks. No pedal edema. Gastrointestinal system: Large abdomen abdomen which is nondistended, soft and slightly tender generalized. No organomegaly or masses felt. Normal bowel sounds heard. Central nervous system: Alert and oriented. No focal neurological deficits. Extremities: Symmetric 5 x 5 power. Skin: No rashes, lesions or ulcers.  Psychiatry: Judgement and insight appear normal. Mood & affect appropriate.    Data Reviewed: I have personally reviewed following labs and imaging studies  CBC: Recent Labs  Lab 06/24/21 0247 06/25/21 0226 06/26/21 0320 06/27/21 0302 06/28/21 0251  WBC 11.0* 7.5 4.4 4.8 7.8  HGB 14.8 14.6 12.4* 13.3 13.1  HCT 44.9 44.3 38.0* 40.1 39.6  MCV 89.8 90.2 90.9 87.7 88.4  PLT 238 238 184 233 227    Basic Metabolic Panel: Recent Labs  Lab 06/24/21 0247 06/25/21 0226  06/26/21 0320 06/27/21 0302 06/28/21 0251  NA 141 143 142 146* 144  K 3.3* 3.9 3.7 2.7* 3.7  CL 106 108 110 110 112*  CO2 25 25 23 25  21*  GLUCOSE 95 127* 106* 118* 120*  BUN 19 30* 53* 53* 57*  CREATININE 1.01 2.43* 2.82* 1.81* 2.09*  CALCIUM 8.3* 7.7* 6.8* 7.9* 8.1*  MG 2.1 1.9  --  2.4 2.7*  PHOS 3.8 5.3*  --  2.5 2.3*    GFR: Estimated Creatinine Clearance: 48.6 mL/min (A) (by C-G formula based on SCr of 2.09 mg/dL (H)). Liver Function Tests: Recent Labs  Lab 06/23/21 1109 06/24/21 0247  AST 18 12*  ALT 10 11  ALKPHOS 86 69  BILITOT 0.8 1.3*  PROT 8.9* 7.4  ALBUMIN 3.6 3.1*    Recent Labs  Lab 06/23/21 1109  LIPASE 22    No results for input(s): AMMONIA in the last 168 hours. Coagulation Profile: Recent Labs  Lab  06/24/21 0247  INR 1.1    Cardiac Enzymes: No results for input(s): CKTOTAL, CKMB, CKMBINDEX, TROPONINI in the last 168 hours. BNP (last 3 results) No results for input(s): PROBNP in the last 8760 hours. HbA1C: No results for input(s): HGBA1C in the last 72 hours.  CBG: No results for input(s): GLUCAP in the last 168 hours. Lipid Profile: No results for input(s): CHOL, HDL, LDLCALC, TRIG, CHOLHDL, LDLDIRECT in the last 72 hours. Thyroid Function Tests: No results for input(s): TSH, T4TOTAL, FREET4, T3FREE, THYROIDAB in the last 72 hours. Anemia Panel: No results for input(s): VITAMINB12, FOLATE, FERRITIN, TIBC, IRON, RETICCTPCT in the last 72 hours. Sepsis Labs: No results for input(s): PROCALCITON, LATICACIDVEN in the last 168 hours.  Recent Results (from the past 240 hour(s))  Resp Panel by RT-PCR (Flu A&B, Covid) Nasopharyngeal Swab     Status: Abnormal   Collection Time: 06/23/21  1:12 PM   Specimen: Nasopharyngeal Swab; Nasopharyngeal(NP) swabs in vial transport medium  Result Value Ref Range Status   SARS Coronavirus 2 by RT PCR POSITIVE (A) NEGATIVE Final    Comment: RESULT CALLED TO, READ BACK BY AND VERIFIED WITH:  COBLE,S RN  @1403  06/23/21 EDENSCA (NOTE) SARS-CoV-2 target nucleic acids are DETECTED.  The SARS-CoV-2 RNA is generally detectable in upper respiratory specimens during the acute phase of infection. Positive results are indicative of the presence of the identified virus, but do not rule out bacterial infection or co-infection with other pathogens not detected by the test. Clinical correlation with patient history and other diagnostic information is necessary to determine patient infection status. The expected result is Negative.  Fact Sheet for Patients: 08/24/21  Fact Sheet for Healthcare Providers: BloggerCourse.com  This test is not yet approved or cleared by the SeriousBroker.it FDA and  has been authorized for detection and/or diagnosis of SARS-CoV-2 by FDA under an Emergency Use Authorization (EUA).  This EUA will remain in effect (meaning this test can be  used) for the duration of  the COVID-19 declaration under Section 564(b)(1) of the Act, 21 U.S.C. section 360bbb-3(b)(1), unless the authorization is terminated or revoked sooner.     Influenza A by PCR NEGATIVE NEGATIVE Final   Influenza B by PCR NEGATIVE NEGATIVE Final    Comment: (NOTE) The Xpert Xpress SARS-CoV-2/FLU/RSV plus assay is intended as an aid in the diagnosis of influenza from Nasopharyngeal swab specimens and should not be used as a sole basis for treatment. Nasal washings and aspirates are unacceptable for Xpert Xpress SARS-CoV-2/FLU/RSV testing.  Fact Sheet for Patients: [place]  Fact Sheet for Healthcare Providers: BloggerCourse.com  This test is not yet approved or cleared by the SeriousBroker.it FDA and has been authorized for detection and/or diagnosis of SARS-CoV-2 by FDA under an Emergency Use Authorization (EUA). This EUA will remain in effect (meaning this test can be used) for the duration of  the COVID-19 declaration under Section 564(b)(1) of the Act, 21 U.S.C. section 360bbb-3(b)(1), unless the authorization is terminated or revoked.  Performed at [facility], [address], [place], [place]   Surgical PCR screen     Status: Abnormal   Collection Time: 06/24/21  1:09 AM   Specimen: Nasal Mucosa; Nasal Swab  Result Value Ref Range Status   MRSA, PCR POSITIVE (A) NEGATIVE Final    Comment: RESULT CALLED TO, READ BACK BY AND VERIFIED WITH: SHAW,C RN @0333  ON 06/24/21 JACKSON,K    Staphylococcus aureus POSITIVE (A) NEGATIVE Final    Comment: (  NOTE) The Xpert SA Assay (FDA approved for NASAL specimens in patients 4 years of age and older), is one component of a comprehensive surveillance program. It is not intended to diagnose infection nor to guide or monitor treatment. Performed at [facility], [address] [place], [place] [address]   MRSA Next Gen by PCR, Nasal     Status: Abnormal   Collection Time: 06/24/21  3:38 PM   Specimen: Nasal Mucosa; Nasal Swab  Result Value Ref Range Status   MRSA by PCR Next Gen DETECTED (A) NOT DETECTED Final    Comment: RESULT CALLED TO, READ BACK BY AND VERIFIED WITH: YOUNTS,D. RN AT 1735 06/24/21 MULLINS,T (NOTE) The GeneXpert MRSA Assay (FDA approved for NASAL specimens only), is one component of a comprehensive MRSA colonization surveillance program. It is not intended to diagnose MRSA infection nor to guide or monitor treatment for MRSA infections. Test performance is not FDA approved in patients less than 6 years old. Performed at [facility], [address] [place], [place] [address]      Radiology Studies: DG Abd Portable 1V  Result Date: 06/27/2021 CLINICAL DATA:  Colonic obstruction post colostomy with Gertie Gowda pouch on 06/24/2021, abdominal pain, nausea, vomiting EXAM: PORTABLE ABDOMEN - 1 VIEW COMPARISON:  Portable exam 0832 hours compared to CT  abdomen and pelvis of 06/23/2021 FINDINGS: Slight gaseous distention of stomach. Air-filled mildly dilated loops of small bowel throughout abdomen question postoperative ileus. G-tube in LEFT upper quadrant. Paucity of colonic gas. No definite bowel wall thickening. Osseous structures unremarkable. IMPRESSION: Air-filled mildly dilated small bowel loops question postoperative ileus. Electronically Signed   By: Ulyses Southward M.D.   On: 06/27/2021 11:45    Scheduled Meds:  chlorhexidine  15 mL Mouth Rinse BID   Chlorhexidine Gluconate Cloth  6 each Topical Daily   docusate sodium  100 mg Oral BID   heparin injection (subcutaneous)  5,000 Units Subcutaneous Q8H   lip balm  1 application Topical BID   mouth rinse  15 mL Mouth Rinse BID   melatonin  3 mg Oral QHS   Continuous Infusions:  dextrose 5 % and 0.2 % NaCl 85 mL/hr at 06/28/21 1000   famotidine (PEPCID) IV 20 mg (06/28/21 1014)   potassium chloride 10 mEq (06/28/21 0959)     LOS: 5 days    Time spent: 35 mins  Hughie Closs, MD [facility]   If 7PM-7AM, please contact night-coverage

## 2021-06-28 NOTE — Consult Note (Addendum)
WOC Nurse ostomy follow up Stoma type/location: LUQ, end colostomy Stomal assessment/size: 1 3/4" round, budded, ruddy, pink Peristomal assessment: intact  Treatment options for stomal/peristomal skin: 2" skin barrier ring Output pouch was almost to the point of exploding when I arrived; explained to patient and CG why this needs to be emptied when 1/3-1/2 full  Ostomy pouching: 2pc. 2 3/4" with 2" skin barrier ring [place] tube removed per orders from [facility] PA, Dr. Francena Hanly at the bedside as well just after removed and his is aware Education provided:  Patient's sister in the room and the patient's sister on a phone app to facetime to watch pouch change. Patient is limited in his engagement to learn care currently, minimally participative with requests from staff Explained role of ostomy nurse and creation of stoma  Explained stoma characteristics (budded, flush, color, texture, care) Demonstrated pouch change (cutting new skin barrier, measuring stoma, cleaning peristomal skin and stoma, use of barrier ring) Demonstrated use of wick to clean spout  Sister has questions about VA coverage for DME and supplies; explained CM would be the one to work on [place] benefits and needed services at [place]  Enrolled patient in [employer] Discharge program: Yes; provided information and verbal consent to the [employer] rep due to COVID +status can not take forms from the room   Provided sister with two educational packets to take home for her and the sister that lives with the patient.   WOC Nurse wound follow up Wound type: surgical  Measurement:28cm x 4cm x 6cm  at deepest point at distal aspect of wound bed  Wound bed:100% clean, subcutaneous tissue, bleeds easily  Drainage (amount, consistency, odor) minimal serosanguinous  Periwound: intact; stoma to the left side of the wound Dressing procedure/placement/frequency: Removed old NPWT dressing Cleansed wound with normal  saline Filled wound with  _2__ pieces of black foam Sealed NPWT dressing at HG Patient tolerated procedure well  WOC nurse will continue to provide NPWT dressing changed due to the complexity of the dressing change.     WOC Nurse will follow along with you for continued support with ostomy teaching and care and NPWT dressings  Travis Calderon [facility] MSN, RN, [place], CNS, [place] [phone]

## 2021-06-28 NOTE — Evaluation (Signed)
Occupational Therapy Evaluation Patient Details Name: Travis Calderon MRN: [id number] DOB: 1956-12-11 Today's Date: 06/28/2021    History of Present Illness This 65 years old male with PMH significant for obesity, diverticulosis presented to the ED 06/23/21 with complaints of abdominal pain and abnormal bowel movements.He is found to be COVID-positive, CT abdomen shows large obstructing colonic mass.  Patient underwent Open sigmoid colectomy with end colostomy.on 06/24/21.Incisional wound VAC given massive distention, shock, contamination.   Clinical Impression   Mr. Zevin Nevares is a 65 year old man who presents with obesity, generalized weakness, decreased activity tolerance, impaired balance and pain s/p above procedure. Patient min guard to stand from recliner with RW and able to ambulate around bed. Patient requiring significant assistance with LB ADLs and set up assist in seated position for UB ADLs. Patient limited by pain and morbid obesity. Patient will benefit from skilled OT services while in hospital to improve deficits and learn compensatory strategies as needed in order to become modified independent. Recommend short term rehab at discharge.    Follow Up Recommendations  SNF    Equipment Recommendations  Other (comment) (TBD)    Recommendations for Other Services       Precautions / Restrictions Precautions Precautions: Fall Precaution Comments: colostomy, right JP drain, VAC lower abd Restrictions Weight Bearing Restrictions: No      Mobility Bed Mobility Overal bed mobility: Needs Assistance Bed Mobility: Sit to Supine       Sit to supine: Mod assist   General bed mobility comments: Mod assist for LEs to return to supine.    Transfers                      Balance Overall balance assessment: Needs assistance Sitting-balance support: Feet supported Sitting balance-Leahy Scale: Fair     Standing balance support: During functional  activity;Bilateral upper extremity supported Standing balance-Leahy Scale: Fair                             ADL either performed or assessed with clinical judgement   ADL Overall ADL's : Needs assistance/impaired Eating/Feeding: Independent   Grooming: Set up;Sitting   Upper Body Bathing: Set up;Sitting   Lower Body Bathing: Maximal assistance;Sit to/from stand   Upper Body Dressing : Set up;Sitting   Lower Body Dressing: Maximal assistance;Sit to/from stand   Toilet Transfer: Min Counselling psychologist and Hygiene: Moderate assistance Toileting - Clothing Manipulation Details (indicate cue type and reason): total assist for colostomy care, patient able to use urinal     Functional mobility during ADLs: Min guard;Rolling walker       Vision Patient Visual Report: No change from baseline       Perception     Praxis      Pertinent Vitals/Pain Pain Assessment: Faces Faces Pain Scale: Hurts little more Pain Location: abdomen Pain Descriptors / Indicators: Discomfort;Grimacing Pain Intervention(s): Limited activity within patient's tolerance;Monitored during session     Hand Dominance Right   Extremity/Trunk Assessment Upper Extremity Assessment Upper Extremity Assessment: Overall WFL for tasks assessed   Lower Extremity Assessment Lower Extremity Assessment: Defer to PT evaluation   Cervical / Trunk Assessment Cervical / Trunk Assessment: Normal Cervical / Trunk Exceptions: body habitus   Communication Communication Communication: No difficulties   Cognition Arousal/Alertness: Awake/alert Behavior During Therapy: WFL for tasks assessed/performed Overall Cognitive Status: Within Functional Limits for tasks assessed  General Comments       Exercises     Shoulder Instructions      Home Living Family/patient expects to be discharged to:: Private  residence   Available Help at Discharge: Family;Available PRN/intermittently Type of Home: Apartment Home Access: Stairs to enter Entrance Stairs-Number of Steps: 13   Home Layout: One level     Bathroom Shower/Tub: Chief Strategy Officer: Standard     Home Equipment: None          Prior Functioning/Environment Level of Independence: Independent with assistive device(s)        Comments: patient reported using sock aid for donning socks        OT Problem List: Decreased strength;Decreased activity tolerance;Impaired balance (sitting and/or standing);Decreased knowledge of use of DME or AE;Decreased knowledge of precautions;Pain;Obesity      OT Treatment/Interventions: Self-care/ADL training;DME and/or AE instruction;Patient/family education;Balance training;Therapeutic activities    OT Goals(Current goals can be found in the care plan section) Acute Rehab OT Goals Patient Stated Goal: return to independence OT Goal Formulation: With patient Time For Goal Achievement: 07/12/21 Potential to Achieve Goals: Good  OT Frequency: Min 2X/week   Barriers to D/C:            Co-evaluation              AM-PAC OT "6 Clicks" Daily Activity     Outcome Measure Help from another person eating meals?: None (NPO) Help from another person taking care of personal grooming?: A Little Help from another person toileting, which includes using toliet, bedpan, or urinal?: A Lot Help from another person bathing (including washing, rinsing, drying)?: A Lot Help from another person to put on and taking off regular upper body clothing?: A Little Help from another person to put on and taking off regular lower body clothing?: A Lot 6 Click Score: 16   End of Session Equipment Utilized During Treatment: Rolling walker Nurse Communication: Mobility status  Activity Tolerance: Patient tolerated treatment well Patient left: in bed;with call bell/phone within reach  OT  Visit Diagnosis: Pain                Time: 1240-1310 OT Time Calculation (min): 30 min Charges:  OT General Charges $OT Visit: 1 Visit OT Evaluation $OT Eval Moderate Complexity: 1 Mod OT Treatments $Therapeutic Activity: 8-22 mins  Lucillie Kiesel, OTR/L Acute Care Rehab Services  Office [phone] Pager: [phone]   Kelli Churn 06/28/2021, 4:10 PM

## 2021-06-29 ENCOUNTER — Inpatient Hospital Stay (HOSPITAL_COMMUNITY): Payer: Medicare Other

## 2021-06-29 DIAGNOSIS — K56609 Unspecified intestinal obstruction, unspecified as to partial versus complete obstruction: Secondary | ICD-10-CM | POA: Diagnosis not present

## 2021-06-29 LAB — CBC WITH DIFFERENTIAL/PLATELET
Abs Immature Granulocytes: 0.54 10*3/uL — ABNORMAL HIGH (ref 0.00–0.07)
Basophils Absolute: 0 10*3/uL (ref 0.0–0.1)
Basophils Relative: 0 %
Eosinophils Absolute: 0.1 10*3/uL (ref 0.0–0.5)
Eosinophils Relative: 1 %
HCT: 36.9 % — ABNORMAL LOW (ref 39.0–52.0)
Hemoglobin: 12.6 g/dL — ABNORMAL LOW (ref 13.0–17.0)
Immature Granulocytes: 5 %
Lymphocytes Relative: 8 %
Lymphs Abs: 0.9 10*3/uL (ref 0.7–4.0)
MCH: 29.3 pg (ref 26.0–34.0)
MCHC: 34.1 g/dL (ref 30.0–36.0)
MCV: 85.8 fL (ref 80.0–100.0)
Monocytes Absolute: 1 10*3/uL (ref 0.1–1.0)
Monocytes Relative: 9 %
Neutro Abs: 9 10*3/uL — ABNORMAL HIGH (ref 1.7–7.7)
Neutrophils Relative %: 77 %
Platelets: 229 10*3/uL (ref 150–400)
RBC: 4.3 MIL/uL (ref 4.22–5.81)
RDW: 14.1 % (ref 11.5–15.5)
WBC: 11.6 10*3/uL — ABNORMAL HIGH (ref 4.0–10.5)
nRBC: 0.6 % — ABNORMAL HIGH (ref 0.0–0.2)

## 2021-06-29 LAB — BASIC METABOLIC PANEL
Anion gap: 8 (ref 5–15)
BUN: 51 mg/dL — ABNORMAL HIGH (ref 8–23)
CO2: 26 mmol/L (ref 22–32)
Calcium: 7.9 mg/dL — ABNORMAL LOW (ref 8.9–10.3)
Chloride: 110 mmol/L (ref 98–111)
Creatinine, Ser: 1.57 mg/dL — ABNORMAL HIGH (ref 0.61–1.24)
GFR, Estimated: 49 mL/min — ABNORMAL LOW (ref >60)
Glucose, Bld: 118 mg/dL — ABNORMAL HIGH (ref 70–99)
Potassium: 2.6 mmol/L — CL (ref 3.5–5.1)
Sodium: 144 mmol/L (ref 135–145)

## 2021-06-29 LAB — POTASSIUM: Potassium: 3 mmol/L — ABNORMAL LOW (ref 3.5–5.1)

## 2021-06-29 LAB — GLUCOSE, CAPILLARY: Glucose-Capillary: 103 mg/dL — ABNORMAL HIGH (ref 70–99)

## 2021-06-29 MED ORDER — POTASSIUM CHLORIDE 10 MEQ/100ML IV SOLN
10.0000 meq | INTRAVENOUS | Status: AC
  Filled 2021-06-29: qty 100

## 2021-06-29 MED ORDER — SODIUM CHLORIDE 0.9 % IV SOLN: INTRAVENOUS | Status: DC

## 2021-06-29 MED ORDER — POTASSIUM CHLORIDE CRYS ER 20 MEQ PO TBCR
40.0000 meq | EXTENDED_RELEASE_TABLET | ORAL | Status: AC
  Administered 2021-06-29 (×2): 40 meq via ORAL
  Filled 2021-06-29 (×2): qty 2

## 2021-06-29 MED ORDER — POTASSIUM CHLORIDE IN NACL 40-0.9 MEQ/L-% IV SOLN
INTRAVENOUS | Status: DC
  Administered 2021-06-29: 50 mL/h via INTRAVENOUS
  Filled 2021-06-29 (×16): qty 1000

## 2021-06-29 MED ORDER — POTASSIUM CHLORIDE 10 MEQ/100ML IV SOLN
10.0000 meq | INTRAVENOUS | Status: AC
  Administered 2021-06-29 (×6): 10 meq via INTRAVENOUS
  Filled 2021-06-29 (×5): qty 100

## 2021-06-29 MED ORDER — POTASSIUM CHLORIDE 10 MEQ/100ML IV SOLN: 10.0000 meq | INTRAVENOUS | Status: DC

## 2021-06-29 NOTE — Plan of Care (Signed)

## 2021-06-29 NOTE — TOC Progression Note (Addendum)
Transition of Care [facility]) - Progression Note    Patient Details  Name: Travis Calderon MRN: [id number] Date of Birth: 03-Oct-1956  Transition of Care [facility] CM/SW Contact  Darleene Cleaver, [place] Phone Number: 06/29/2021, 5:21 PM  Clinical Narrative:    PT recommending SNF placement for patient.  CSW to discuss with patient.  Formal assessment to follow.        Expected Discharge Plan and Services                                                 Social Determinants of Health (SDOH) Interventions    Readmission Risk Interventions No flowsheet data found.

## 2021-06-29 NOTE — Progress Notes (Signed)
5 Days Post-Op    CC:  abdominal pain and constipation  Subjective: Pt seems to be doing well.  He is tolerating sips with good output from his colostomy.  Still on O2.  Condom cath in place. Mid line with wound vac.  Serosanguinous drainage from the JP.  Objective: Vital signs in last 24 hours: Temp:  [97.9 F (36.6 C)-99.4 F (37.4 C)] 98.5 F (36.9 C) (07/14 0819) Pulse Rate:  [31-105] 79 (07/14 0819) Resp:  [16-28] 16 (07/14 0819) BP: (99-159)/(56-98) 108/64 (07/14 0819) SpO2:  [85 %-100 %] 96 % (07/14 0819) Last BM Date: 06/29/21 500 p.o. recorded 1400 IV 950 urine Drains 10 Colostomy 2400 T-max 99.4 vital signs are stable Potassium 2.6 Creatinine 2.82(7/10)>>1.81>>2.09>>1.57(7/14) WBC 4.4>> 4.8>> 7.8>> 11.6(7/14)  Intake/Output from previous day: 07/13 0701 - 07/14 0700 In: 1912.6 [P.O.:500; I.V.:823.1; IV Piggyback:589.5] Out: 3360 [Urine:950; Drains:10; Stool:2400] Intake/Output this shift: Total I/O In: 96.9 [IV Piggyback:96.9] Out: 900 [Urine:600; Stool:300]  General appearance: alert, cooperative, and no distress Resp: rales both bases GI: large abdomen, green colored liquid stool in the ostomy,  midline wound vac changed MWF.  JP SS fluid  Lab Results:  Recent Labs    06/28/21 0251 06/29/21 0319  WBC 7.8 11.6*  HGB 13.1 12.6*  HCT 39.6 36.9*  PLT 227 229    BMET Recent Labs    06/28/21 0251 06/29/21 0319  NA 144 144  K 3.7 2.6*  CL 112* 110  CO2 21* 26  GLUCOSE 120* 118*  BUN 57* 51*  CREATININE 2.09* 1.57*  CALCIUM 8.1* 7.9*   PT/INR No results for input(s): LABPROT, INR in the last 72 hours.  Recent Labs  Lab 06/23/21 1109 06/24/21 0247  AST 18 12*  ALT 10 11  ALKPHOS 86 69  BILITOT 0.8 1.3*  PROT 8.9* 7.4  ALBUMIN 3.6 3.1*     Lipase     Component Value Date/Time   LIPASE 22 06/23/2021 1109     Medications:  chlorhexidine  15 mL Mouth Rinse BID   Chlorhexidine Gluconate Cloth  6 each Topical Daily   docusate  sodium  100 mg Oral BID   heparin injection (subcutaneous)  5,000 Units Subcutaneous Q8H   lip balm  1 application Topical BID   mouth rinse  15 mL Mouth Rinse BID   melatonin  3 mg Oral QHS   Anti-infectives (From admission, onward)    Start     Dose/Rate Route Frequency Ordered Stop   06/24/21 2200  cefoTEtan (CEFOTAN) 2 g in sodium chloride 0.9 % 100 mL IVPB        2 g 200 mL/hr over 30 Minutes Intravenous Every 12 hours 06/24/21 1514 06/24/21 2109   06/24/21 0830  cefoTEtan (CEFOTAN) 2 g in sodium chloride 0.9 % 100 mL IVPB        2 g 200 mL/hr over 30 Minutes Intravenous On call to O.R. 06/24/21 0733 06/24/21 1117        Assessment/Plan Obstructing sigmoid colon mass POD3 s/p open sigmoid colectomy with end colostomy 06/24/21 Dr. Freida Busman - pathology pending - NGT removed yesterday and patient started on CLD - had a lot of n/v overnight, check KUB this AM, NPO - needs to mobilize, PT/OT - WOC following for new colostomy and VAC - drain with SS fluid - continue for now - pain well controlled currently  - goal of K>4.0 and Mg >2.0 to optimize bowel function    K+ 2.6 today - defer to Dr.  Pahwani  FEN: NPO, IVF per TRH/nephrology VTE: SCDs, SQH ID: cefotetan x2 doses   AKI - Cr 1.8 this AM, improving, nephrology following  COVID + - per TRH Morbid Obesity - BMI 49.89 BPH  Plan:  We are going to start clears and see how he does, we can decrease the IV from our standpoint; his hand is swollen.   I would get some weights  and see where is is on the fluid balance.  Watch ostomy output.        LOS: 6 days    Kamareon Sciandra 06/29/2021 Please see Amion

## 2021-06-29 NOTE — Progress Notes (Signed)
PROGRESS NOTE    Travis Calderon  PXT:[id number] DOB: Aug 15, 1956 DOA: 06/23/2021 PCP: [facility]     Brief Narrative: This 65 years old male with PMH significant for obesity, diverticulosis presented to the ED with complaints of abdominal pain and abnormal bowel movements.  Patient reports for the last several days he has had generalized abdominal pain associated with distention, nausea but denies any vomiting.  He also report over the past several months, he has had abnormal bowel movement,  he describes it as more frequent and does not appear same.  He regularly follows up with PCP,  does not take any take medications routinely. He [place] in [place]  He is found to be COVID-positive, CT abdomen shows large obstructing colonic mass.  Patient was started on pain medication. General surgery was consulted. Patient is transferred to [facility] and patient underwent Open sigmoid colectomy with end colostomy.  Postoperative course complicated by ATN,  Nephrology consulted , continued on IV hydration.  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 Colonic Mass/POA, ileus: CT abdomen pelvis showed large obstructing colonic mass. He reported having colonoscopy in 2019 [place],  he does not know the results. General surgery was consulted. Patient underwent open sigmoid colectomy with end colostomy on 06/24/21. Postoperative course complicated by ileus, recommended NG tube, IV hydration, pain  control.. Continue colostomy care and training. Patient has good output in colostomy.  NG tube was removed and started on clear liquid diet but patient has emesis overnight yesterday and he was put back to n.p.o. patient is having good output from colostomy bag.  Neurosurgery has advanced him to clear liquid diet.  COVID+: Incidental positive, chest x-ray unremarkable.  Remains asymptomatic.  No indication for treatment. Patient has briefly required oxygen post op but then was back to room air.  Currently he is on oxygen only for comfort and likely due to atelectasis as he seems to be not taking deep breaths.  I will check repeat chest x-ray today.  Acute kidney injury Likely prerenal: Serum creatinine up from 1.01> 2.43 > 2.82> 1.81> 2.09 > 1.57 likely prerenal postoperatively.  CT abdomen with no hydronephrosis.  Switch from dextrose fluid to normal saline at 50 cc/h since he is now slightly edematous in the upper extremities.  Hypotension:  Blood pressure controlled.  Continue midodrine 5 mg 3 times daily.  Hypokalemia: Further down to 2.6 today.  Will replace now.  Recheck in the afternoon and replace further if it is still low.  Morbid obesity:  recommend diet and weight loss.  Hypophosphatemia: We will recheck in the morning.  DVT prophylaxis:  SCDs Code Status: Full code. Family Communication: None at bedside.  Patient alert and oriented. Disposition Plan:   Status is: Inpatient  Remains inpatient appropriate because:Inpatient level of care appropriate due to severity of illness  Dispo: The patient is from: Home              Anticipated d/c is to: Home VS SNF, to be  assessed by PT OT              Patient currently is not medically stable to d/c.   Difficult to place patient No   Consultants:  General surgery Nephrology  Procedures: Open sigmoid colectomy with end colostomy Antimicrobials:  Anti-infectives (From admission, onward)    Start     Dose/Rate Route  Frequency Ordered Stop   06/24/21 2200  cefoTEtan (CEFOTAN) 2 g in sodium chloride 0.9 % 100 mL IVPB        2 g 200 mL/hr over 30 Minutes Intravenous Every 12 hours 06/24/21 1514 06/24/21 2109   06/24/21 0830  cefoTEtan (CEFOTAN) 2 g in sodium chloride 0.9 % 100 mL IVPB        2 g 200 mL/hr over 30 Minutes Intravenous On call to O.R. 06/24/21 [id number] 06/24/21 1117        Subjective: Seen and examined.  He has no complaint.  No shortness of breath or abdominal pain.  Looks tired.  Objective: Vitals:   06/28/21 2315 06/28/21 2359 06/29/21 0354 06/29/21 0819  BP:  108/72 99/68 108/64  Pulse: 89 93 96 79  Resp: 19 20  16   Temp:  98.3 F (36.8 C) 97.9 F (36.6 C) 98.5 F (36.9 C)  TempSrc:  Oral Oral   SpO2: 99% 98% 98% 96%  Weight:      Height:        Intake/Output Summary (Last 24 hours) at 06/29/2021 1133 Last data filed at 06/29/2021 0959 Gross per 24 hour  Intake 1737.55 ml  Output 5235 ml  Net -3497.45 ml    Filed Weights   06/23/21 1059 06/23/21 2251 06/24/21 1500  Weight: (!) 145.2 kg (!) 144.5 kg (!) 144.5 kg    Examination:  General exam: Appears calm and comfortable, morbidly obese Respiratory system: Diminished breath sounds bilaterally. Respiratory effort normal. Cardiovascular system: S1 & S2 heard, RRR. No JVD, murmurs, rubs, gallops or clicks.  Trace pitting edema bilateral lower extremity, +1 pitting edema bilateral lower extremity. Gastrointestinal system: Abdomen is obese but nondistended, soft and nontender. No organomegaly or masses felt. Normal bowel sounds heard. Central nervous system: Alert and oriented. No focal neurological deficits. Extremities: Symmetric 5 x 5 power. Skin: No rashes, lesions or ulcers.   Data Reviewed: I have personally reviewed following labs and imaging studies  CBC: Recent Labs  Lab 06/25/21 0226 06/26/21 0320 06/27/21 0302 06/28/21 0251 06/29/21 0319  WBC 7.5 4.4 4.8 7.8 11.6*  NEUTROABS  --   --   --   --  9.0*   HGB 14.6 12.4* 13.3 13.1 12.6*  HCT 44.3 38.0* 40.1 39.6 36.9*  MCV 90.2 90.9 87.7 88.4 85.8  PLT 238 184 233 227 229    Basic Metabolic Panel: Recent Labs  Lab 06/24/21 0247 06/25/21 0226 06/26/21 0320 06/27/21 0302 06/28/21 0251 06/29/21 0319  NA 141 143 142 146* 144 144  K 3.3* 3.9 3.7 2.7* 3.7 2.6*  CL 106 108 110 110 112* 110  CO2 25 25 23 25  21* 26  GLUCOSE 95 127* 106* 118* 120* 118*  BUN 19 30* 53* 53* 57* 51*  CREATININE 1.01 2.43* 2.82* 1.81* 2.09* 1.57*  CALCIUM 8.3* 7.7* 6.8* 7.9* 8.1* 7.9*  MG 2.1 1.9  --  2.4 2.7*  --   PHOS 3.8 5.3*  --  2.5 2.3*  --     GFR: Estimated Creatinine Clearance: 64.7 mL/min (A) (by C-G formula based on SCr of 1.57 mg/dL (H)).  Liver Function Tests: Recent Labs  Lab 06/23/21 1109 06/24/21 0247  AST 18 12*  ALT 10 11  ALKPHOS 86 69  BILITOT 0.8 1.3*  PROT 8.9* 7.4  ALBUMIN 3.6 3.1*    Recent Labs  Lab 06/23/21 1109  LIPASE 22    No results for input(s): AMMONIA in the last 168 hours. Coagulation Profile: Recent Labs  Lab 06/24/21 0247  INR 1.1    Cardiac Enzymes: No results for input(s): CKTOTAL, CKMB, CKMBINDEX, TROPONINI in the last 168 hours. BNP (last 3 results) No results for input(s): PROBNP in the last 8760 hours. HbA1C: No results for input(s): HGBA1C in the last 72 hours.  CBG: No results for input(s): GLUCAP in the last 168 hours. Lipid Profile: No results for input(s): CHOL, HDL, LDLCALC, TRIG, CHOLHDL, LDLDIRECT in the last 72 hours. Thyroid Function Tests: No results for input(s): TSH, T4TOTAL, FREET4, T3FREE, THYROIDAB in the last 72 hours. Anemia Panel: No results for input(s): VITAMINB12, FOLATE, FERRITIN, TIBC, IRON, RETICCTPCT in the last 72 hours. Sepsis Labs: No results for input(s): PROCALCITON, LATICACIDVEN in the last 168 hours.  Recent Results (from the past 240 hour(s))  Resp Panel by RT-PCR (Flu A&B, Covid) Nasopharyngeal Swab     Status: Abnormal   Collection Time:  06/23/21  1:12 PM   Specimen: Nasopharyngeal Swab; Nasopharyngeal(NP) swabs in vial transport medium  Result Value Ref Range Status   SARS Coronavirus 2 by RT PCR POSITIVE (A) NEGATIVE Final    Comment: RESULT CALLED TO, READ BACK BY AND VERIFIED WITH:  COBLE,S RN @1403  06/23/21 EDENSCA (NOTE) SARS-CoV-2 target nucleic acids are DETECTED.  The SARS-CoV-2 RNA is generally detectable in upper respiratory specimens during the acute phase of infection. Positive results are indicative of the presence of the identified virus, but do not rule out bacterial infection or co-infection with other pathogens not detected by the test. Clinical correlation with patient history and other diagnostic information is necessary to determine patient infection status. The expected result is Negative.  Fact Sheet for Patients: 08/24/21  Fact Sheet for Healthcare Providers: BloggerCourse.com  This test is not yet approved or cleared by the [employer]iousBroker.it FDA and  has been authorized for detection and/or diagnosis of SARS-CoV-2 by FDA under an Emergency Use Authorization (EUA).  This EUA will remain in effect (meaning this test can be  used) for the duration of  the COVID-19 declaration under Section 564(b)(1) of the Act, 21 U.S.C. section 360bbb-3(b)(1), unless the authorization is terminated or revoked sooner.     Influenza A by PCR NEGATIVE NEGATIVE Final   Influenza B by PCR NEGATIVE NEGATIVE Final    Comment: (NOTE) The Xpert Xpress SARS-CoV-2/FLU/RSV plus assay is intended as an aid in the diagnosis of influenza from Nasopharyngeal swab specimens and should not be used as a sole basis for treatment. Nasal washings and aspirates are unacceptable for Xpert Xpress SARS-CoV-2/FLU/RSV testing.  Fact Sheet for Patients: [place]  Fact Sheet for Healthcare  Providers: BloggerCourse.com  This test is not yet approved or cleared by the SeriousBroker.it FDA and has been authorized for detection and/or diagnosis of SARS-CoV-2 by FDA under an Emergency Use Authorization (EUA). This EUA will remain in effect (meaning this test can be used) for the duration of the COVID-19 declaration under Section 564(b)(1) of the Act, 21 U.S.C. section 360bbb-3(b)(1), unless the authorization is terminated or revoked.  Performed at [facility], [address], [place], [place]   Surgical PCR screen  Status: Abnormal   Collection Time: 06/24/21  1:09 AM   Specimen: Nasal Mucosa; Nasal Swab  Result Value Ref Range Status   MRSA, PCR POSITIVE (A) NEGATIVE Final    Comment: RESULT CALLED TO, READ BACK BY AND VERIFIED WITH: SHAW,C RN @0333  ON 06/24/21 JACKSON,K    Staphylococcus aureus POSITIVE (A) NEGATIVE Final    Comment: (NOTE) The Xpert SA Assay (FDA approved for NASAL specimens in patients 75 years of age and older), is one component of a comprehensive surveillance program. It is not intended to diagnose infection nor to guide or monitor treatment. Performed at [facility], [address], [place], [place]   MRSA Next Gen by PCR, Nasal     Status: Abnormal   Collection Time: 06/24/21  3:38 PM   Specimen: Nasal Mucosa; Nasal Swab  Result Value Ref Range Status   MRSA by PCR Next Gen DETECTED (A) NOT DETECTED Final    Comment: RESULT CALLED TO, READ BACK BY AND VERIFIED WITH: YOUNTS,D. RN AT 1735 06/24/21 MULLINS,T (NOTE) The GeneXpert MRSA Assay (FDA approved for NASAL specimens only), is one component of a comprehensive MRSA colonization surveillance program. It is not intended to diagnose MRSA infection nor to guide or monitor treatment for MRSA infections. Test performance is not FDA approved in patients less than 30 years old. Performed at [facility],  [address], [place], [place]      Radiology Studies: No results found.  Scheduled Meds:  chlorhexidine  15 mL Mouth Rinse BID   Chlorhexidine Gluconate Cloth  6 each Topical Daily   docusate sodium  100 mg Oral BID   heparin injection (subcutaneous)  5,000 Units Subcutaneous Q8H   lip balm  1 application Topical BID   mouth rinse  15 mL Mouth Rinse BID   melatonin  3 mg Oral QHS   Continuous Infusions:  famotidine (PEPCID) IV Stopped (06/28/21 1044)   potassium chloride 10 mEq (06/29/21 1009)   potassium chloride       LOS: 6 days    Time spent: 30 mins  07/01/21, MD [facility]   If 7PM-7AM, please contact night-coverage

## 2021-06-29 NOTE — Care Management Important Message (Signed)
Important Message  Patient Details IM Letter placed in Patient's door. Name: Nishanth Mccaughan MRN: [id number] Date of Birth: 13-Nov-1956   Medicare Important Message Given:  Yes     Caren Macadam 06/29/2021, 2:37 PM

## 2021-06-29 NOTE — Progress Notes (Signed)
Physical Therapy Treatment Patient Details Name: Travis Calderon MRN: [id number] DOB: 1956/08/28 Today's Date: 06/29/2021    History of Present Illness This 65 years old male with PMH significant for obesity, diverticulosis presented to the ED 06/23/21 with complaints of abdominal pain and abnormal bowel movements.He is found to be COVID-positive, CT abdomen shows large obstructing colonic mass.  Patient underwent Open sigmoid colectomy with end colostomy.on 06/24/21.Incisional wound VAC given massive distention, shock, contamination.  positive for covid    PT Comments    Patient eager to get into recliner. Patient with improved mobility, mobilized to sitting with HOB raised. Stood  at [employer], right nares with bleeding. Stopped quickly. Patient able to step around to recliner using RW. Patient  able to use urinal while sitting upright in recliner . Sitting upright is stable with RW in front of him.  Patient's SPO2 955 on RA, left [place] off and RN aware.  Follow Up Recommendations  SNF     Equipment Recommendations  Rolling walker with 5" wheels (bariatric)    Recommendations for Other Services       Precautions / Restrictions Precautions Precautions: Fall Precaution Comments: colostomy, right JP drain, VAC lower abd, urinary urgency, may have a nose bleed    Mobility  Bed Mobility Overal bed mobility: Needs Assistance       Supine to sit: Min assist;HOB elevated     General bed mobility comments: min assist , HOB max elevated, use of rails    Transfers Overall transfer level: Needs assistance Equipment used: Rolling walker (2 wheeled) Transfers: Sit to/from Stand Sit to Stand: Min assist         General transfer comment: at RW  , bed raised. Rises  without support.  Ambulation/Gait Ambulation/Gait assistance: Min assist Gait Distance (Feet): 5 Feet Assistive device: Rolling walker (2 wheeled) Gait Pattern/deviations: Step-to pattern     General Gait Details: bed to  recliner,   Stairs             Wheelchair Mobility    Modified Rankin (Stroke Patients Only)       Balance   Sitting-balance support: Feet supported;Bilateral upper extremity supported Sitting balance-Leahy Scale: Fair     Standing balance support: During functional activity;Bilateral upper extremity supported Standing balance-Leahy Scale: Fair Standing balance comment: supported in standing, UE support                            Cognition Arousal/Alertness: Awake/alert Behavior During Therapy: WFL for tasks assessed/performed                                          Exercises      General Comments        Pertinent Vitals/Pain Pain Assessment: No/denies pain    Home Living                      Prior Function            PT Goals (current goals can now be found in the care plan section) Progress towards PT goals: Progressing toward goals    Frequency    Min 2X/week      PT Plan Current plan remains appropriate    Co-evaluation              AM-PAC PT "6 Clicks" Mobility  Outcome Measure  Help needed turning from your back to your side while in a flat bed without using bedrails?: A Little Help needed moving from lying on your back to sitting on the side of a flat bed without using bedrails?: A Little Help needed moving to and from a bed to a chair (including a wheelchair)?: A Little Help needed standing up from a chair using your arms (e.g., wheelchair or bedside chair)?: A Little Help needed to walk in hospital room?: A Little Help needed climbing 3-5 steps with a railing? : Total 6 Click Score: 16    End of Session   Activity Tolerance: Patient tolerated treatment well Patient left: in chair;with call bell/phone within reach;with chair alarm set Nurse Communication: Mobility status PT Visit Diagnosis: Unsteadiness on feet (R26.81);Difficulty in walking, not elsewhere classified (R26.2)      Time: 6269-4854 PT Time Calculation (min) (ACUTE ONLY): 48 min  Charges:  $Therapeutic Activity: 23-37 mins $Self Care/Home Management: 8-22                    Blanchard Kelch PT Acute Rehabilitation Services Pager [phone] Office [phone]    Rada Hay 06/29/2021, 4:57 PM

## 2021-06-30 DIAGNOSIS — K56609 Unspecified intestinal obstruction, unspecified as to partial versus complete obstruction: Secondary | ICD-10-CM | POA: Diagnosis not present

## 2021-06-30 LAB — CBC WITH DIFFERENTIAL/PLATELET
Abs Immature Granulocytes: 0.69 10*3/uL — ABNORMAL HIGH (ref 0.00–0.07)
Basophils Absolute: 0 10*3/uL (ref 0.0–0.1)
Basophils Relative: 0 %
Eosinophils Absolute: 0.1 10*3/uL (ref 0.0–0.5)
Eosinophils Relative: 1 %
HCT: 38.4 % — ABNORMAL LOW (ref 39.0–52.0)
Hemoglobin: 12.9 g/dL — ABNORMAL LOW (ref 13.0–17.0)
Immature Granulocytes: 4 %
Lymphocytes Relative: 7 %
Lymphs Abs: 1 10*3/uL (ref 0.7–4.0)
MCH: 29.3 pg (ref 26.0–34.0)
MCHC: 33.6 g/dL (ref 30.0–36.0)
MCV: 87.3 fL (ref 80.0–100.0)
Monocytes Absolute: 1.2 10*3/uL — ABNORMAL HIGH (ref 0.1–1.0)
Monocytes Relative: 8 %
Neutro Abs: 12.9 10*3/uL — ABNORMAL HIGH (ref 1.7–7.7)
Neutrophils Relative %: 80 %
Platelets: 220 10*3/uL (ref 150–400)
RBC: 4.4 MIL/uL (ref 4.22–5.81)
RDW: 14.4 % (ref 11.5–15.5)
WBC: 16 10*3/uL — ABNORMAL HIGH (ref 4.0–10.5)
nRBC: 0.2 % (ref 0.0–0.2)

## 2021-06-30 LAB — BASIC METABOLIC PANEL
Anion gap: 10 (ref 5–15)
BUN: 41 mg/dL — ABNORMAL HIGH (ref 8–23)
CO2: 21 mmol/L — ABNORMAL LOW (ref 22–32)
Calcium: 7.9 mg/dL — ABNORMAL LOW (ref 8.9–10.3)
Chloride: 110 mmol/L (ref 98–111)
Creatinine, Ser: 1.29 mg/dL — ABNORMAL HIGH (ref 0.61–1.24)
GFR, Estimated: >60 mL/min (ref >60)
Glucose, Bld: 97 mg/dL (ref 70–99)
Potassium: 3 mmol/L — ABNORMAL LOW (ref 3.5–5.1)
Sodium: 141 mmol/L (ref 135–145)

## 2021-06-30 LAB — POTASSIUM: Potassium: 3.4 mmol/L — ABNORMAL LOW (ref 3.5–5.1)

## 2021-06-30 LAB — C DIFFICILE (CDIFF) QUICK SCRN (NO PCR REFLEX)
C Diff antigen: NEGATIVE
C Diff interpretation: NOT DETECTED
C Diff toxin: NEGATIVE

## 2021-06-30 LAB — MAGNESIUM: Magnesium: 2.7 mg/dL — ABNORMAL HIGH (ref 1.7–2.4)

## 2021-06-30 LAB — PHOSPHORUS: Phosphorus: 2.7 mg/dL (ref 2.5–4.6)

## 2021-06-30 MED ORDER — POTASSIUM CHLORIDE CRYS ER 20 MEQ PO TBCR
40.0000 meq | EXTENDED_RELEASE_TABLET | ORAL | Status: AC
  Administered 2021-06-30 (×2): 40 meq via ORAL
  Filled 2021-06-30 (×2): qty 2

## 2021-06-30 MED ORDER — PSYLLIUM 95 % PO PACK
1.0000 | PACK | Freq: Two times a day (BID) | ORAL | Status: DC
  Administered 2021-06-30 – 2021-07-19 (×38): 1 via ORAL
  Filled 2021-06-30 (×40): qty 1

## 2021-06-30 NOTE — Progress Notes (Signed)
6 Days Post-Op   Chief Complaint/Subjective: Pain improving, ambulated and sat in chair yesterday, tolerating liquids  Review of Systems See above, otherwise other systems negative  Objective: Vital signs in last 24 hours: Temp:  [97.6 F (36.4 C)-100 F (37.8 C)] 100 F (37.8 C) (07/15 0522) Pulse Rate:  [86-98] 89 (07/15 0522) Resp:  [16] 16 (07/15 0522) BP: (103-112)/(69-76) 103/69 (07/15 0522) SpO2:  [92 %-97 %] 92 % (07/15 0522) Weight:  [141 kg] 141 kg (07/15 0500) Last BM Date: 06/30/21 Intake/Output from previous day: 07/14 0701 - 07/15 0700 In: 3462.6 [P.O.:480; I.V.:2369.5; IV Piggyback:613.2] Out: 6925 [Urine:1550; Stool:5375] Intake/Output this shift: Total I/O In: 100 [I.V.:100] Out: -   PE: Gen: NAd Resp: nonlabored Card: RRR Abd: soft, vac in place, left ostomy patent with liquid output  Lab Results:  Recent Labs    06/29/21 0319 06/30/21 0324  WBC 11.6* 16.0*  HGB 12.6* 12.9*  HCT 36.9* 38.4*  PLT 229 220   BMET Recent Labs    06/29/21 0319 06/29/21 1451 06/30/21 0324  NA 144  --  141  K 2.6* 3.0* 3.0*  CL 110  --  110  CO2 26  --  21*  GLUCOSE 118*  --  97  BUN 51*  --  41*  CREATININE 1.57*  --  1.29*  CALCIUM 7.9*  --  7.9*   PT/INR No results for input(s): LABPROT, INR in the last 72 hours. CMP     Component Value Date/Time   NA 141 06/30/2021 0324   K 3.0 (L) 06/30/2021 0324   CL 110 06/30/2021 0324   CO2 21 (L) 06/30/2021 0324   GLUCOSE 97 06/30/2021 0324   BUN 41 (H) 06/30/2021 0324   CREATININE 1.29 (H) 06/30/2021 0324   CALCIUM 7.9 (L) 06/30/2021 0324   PROT 7.4 06/24/2021 0247   ALBUMIN 3.1 (L) 06/24/2021 0247   AST 12 (L) 06/24/2021 0247   ALT 11 06/24/2021 0247   ALKPHOS 69 06/24/2021 0247   BILITOT 1.3 (H) 06/24/2021 0247   GFRNONAA >60 06/30/2021 0324   Lipase     Component Value Date/Time   LIPASE 22 06/23/2021 1109    Studies/Results: DG CHEST PORT 1 VIEW  Result Date: 06/29/2021 CLINICAL DATA:   Abdominal pain and hypoxia. EXAM: PORTABLE CHEST 1 VIEW COMPARISON:  7022 FINDINGS: 1156 hours. Low lung volumes. The cardio pericardial silhouette is enlarged. Basilar atelectasis, left greater than right, is progressive in the interval. No pulmonary edema. Similar fullness right hilum likely vascular etiology. The visualized bony structures of the thorax show no acute abnormality. Telemetry leads overlie the chest. IMPRESSION: Low volume film with progressive bibasilar atelectasis, left greater than right. Electronically Signed   By: Kennith Center M.D.   On: 06/29/2021 13:53    Anti-infectives: Anti-infectives (From admission, onward)    Start     Dose/Rate Route Frequency Ordered Stop   06/24/21 2200  cefoTEtan (CEFOTAN) 2 g in sodium chloride 0.9 % 100 mL IVPB        2 g 200 mL/hr over 30 Minutes Intravenous Every 12 hours 06/24/21 1514 06/24/21 2109   06/24/21 0830  cefoTEtan (CEFOTAN) 2 g in sodium chloride 0.9 % 100 mL IVPB        2 g 200 mL/hr over 30 Minutes Intravenous On call to O.R. 06/24/21 0733 06/24/21 1117       Assessment/Plan Obstructing sigmoid colon mass POD 6 s/p open sigmoid colectomy with end colostomy 06/24/21 Dr. Freida Busman  Perforated  diverticulitis - pathology consistent with diverticulitis - needs to mobilize, PT/OT - WOC following for new colostomy and VAC - drain with SS fluid - continue for now - pain well controlled currently   Hypokalemia - replace per TRH High output ostomy - advance diet and add metamucil, continue IVF and watch fluid balance Uptrending leukocytosis - continue to monitor, drain, wound and ostomy improving, tolerating diet. May require repeat CT scan if worsens. Could also be related to other issues like his viral pneumonia   AKI - Cr 1.3 from 1.8 improving, nephrology following  COVID + - per TRH Morbid Obesity - BMI 49.89 BPH - toelrated foley removal   FEN: carb mod diet, IVF per TRH/nephrology VTE: SCDs, SQH ID: cefotetan x2  doses  Plan:  advancing diet, watching output of colostomy, continue to ambulate   LOS: 7 days   [facility] 06/30/2021, 9:31 AM Please see Amion for pager number during day hours 7:00am-4:30pm or 7:00am -11:30am on weekends

## 2021-06-30 NOTE — Progress Notes (Signed)
PROGRESS NOTE    Travis Calderon  JME:[id number] DOB: 14-Apr-1956 DOA: 06/23/2021 [facility]: [facility]     Brief Narrative: This 65 years old male with PMH significant for obesity, diverticulosis presented to the ED with complaints of abdominal pain and abnormal bowel movements.  Patient reports for the last several days he has had generalized abdominal pain associated with distention, nausea but denies any vomiting.  He also report over the past several months, he has had abnormal bowel movement,  he describes it as more frequent and does not appear same.  He regularly follows up with PCP,  does not take any take medications routinely. He [place]  He is found to be COVID-positive, CT abdomen shows large obstructing colonic mass.  Patient was started on pain medication. General surgery was consulted. Patient is transferred to [facility] and patient underwent Open sigmoid colectomy with end colostomy.  Postoperative course complicated by ATN,  Nephrology consulted , continued on IV hydration.  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 Colonic Mass/POA, ileus: CT abdomen pelvis showed large obstructing colonic mass. He reported having colon[place] in [place],  he does not know the results. General surgery was consulted. Patient underwent open sigmoid colectomy with end colostomy on 06/24/21. Postoperative course complicated by ileus, recommended NG tube, IV hydration, pain  control.. Continue colostomy care and training. NG tube was removed and started on clear liquid diet but patient had emesis overnight yesterday and he was put back to n.p.o. patient.  He is tolerating clear liquid diet however he has very thin and liquidy stools from his colostomy bag which is 2-4 colostomy.  Has also low-grade fever and worsening leukocytosis.  Has been on antibiotics, suspect C. difficile infection.  Will check further.  Discussed with Dr. Ninetta Lights.  COVID+: Incidental positive, chest x-ray unremarkable.  Remains asymptomatic.  No indication for treatment. Patient has briefly required oxygen post op but then was back to room air.  X-ray repeated on 06/29/2021 once again did not show any COVID pathology.  He is now on room air.  Acute kidney injury Likely prerenal: Serum creatinine up from 1.01> 2.43 > 2.82> 1.81> 2.09 > 1.57> 1.29 likely prerenal postoperatively.  CT abdomen with no hydronephrosis.  Continue normal saline.  Repeat labs in the morning.  Hypotension:  Blood pressure controlled.  Continue midodrine 5 mg 3 times daily.  Hypokalemia: Down to 3.0 again.  Will replace.  Recheck in the morning.  Morbid obesity:  recommend diet and weight loss.  Hypophosphatemia: Resolved.  DVT prophylaxis:  SCDs Code Status: Full code. Family Communication: None at bedside.  Patient alert and oriented. Disposition Plan:   Status is: Inpatient  Remains inpatient appropriate because:Inpatient level of care appropriate due to severity of illness  Dispo: The patient is from: Home              Anticipated d/c is to: Home VS SNF, to be assessed by PT OT  Patient currently is not medically stable to d/c.   Difficult to place patient No   Consultants:  General surgery Nephrology  Procedures: Open sigmoid colectomy with end colostomy Antimicrobials:  Anti-infectives (From admission, onward)    Start     Dose/Rate Route Frequency Ordered Stop   06/24/21 2200   cefoTEtan (CEFOTAN) 2 g in sodium chloride 0.9 % 100 mL IVPB        2 g 200 mL/hr over 30 Minutes Intravenous Every 12 hours 06/24/21 1514 06/24/21 2109   06/24/21 0830  cefoTEtan (CEFOTAN) 2 g in sodium chloride 0.9 % 100 mL IVPB        2 g 200 mL/hr over 30 Minutes Intravenous On call to O.R. 06/24/21 7673 06/24/21 1117        Subjective: Seen and examined.  He has no complaints.  Objective: Vitals:   06/29/21 2055 06/30/21 0500 06/30/21 0522 06/30/21 1252  BP: 109/75  103/69 109/81  Pulse: 98  89 86  Resp: 16  16 18   Temp: 98.7 F (37.1 C)  100 F (37.8 C) 98.1 F (36.7 C)  TempSrc:   Oral   SpO2: 97%  92% 95%  Weight:  (!) 141 kg    Height:        Intake/Output Summary (Last 24 hours) at 06/30/2021 1258 Last data filed at 06/30/2021 1108 Gross per 24 hour  Intake 1910.63 ml  Output 5200 ml  Net -3289.37 ml    Filed Weights   06/23/21 2251 06/24/21 1500 06/30/21 0500  Weight: (!) 144.5 kg (!) 144.5 kg (!) 141 kg    Examination:  General exam: Appears calm and comfortable, morbidly obese Respiratory system: Clear to auscultation. Respiratory effort normal. Cardiovascular system: S1 & S2 heard, RRR. No JVD, murmurs, rubs, gallops or clicks.  Trace pitting edema bilateral lower extremity. Gastrointestinal system: Abdomen is obese but nondistended, soft and nontender. No organomegaly or masses felt. Normal bowel sounds heard.  Greenish liquid in colostomy bag. Central nervous system: Alert and oriented. No focal neurological deficits. Extremities: Symmetric 5 x 5 power. Skin: No rashes, lesions or ulcers.  Psychiatry: Judgement and insight appear normal. Mood & affect appropriate.    Data Reviewed: I have personally reviewed following labs and imaging studies  CBC: Recent Labs  Lab 06/26/21 0320 06/27/21 0302 06/28/21 0251 06/29/21 0319 06/30/21 0324  WBC 4.4 4.8 7.8 11.6* 16.0*  NEUTROABS  --   --   --  9.0* 12.9*  HGB 12.4* 13.3 13.1 12.6* 12.9*  HCT  38.0* 40.1 39.6 36.9* 38.4*  MCV 90.9 87.7 88.4 85.8 87.3  PLT 184 233 227 229 220    Basic Metabolic Panel: Recent Labs  Lab 06/24/21 0247 06/25/21 0226 06/26/21 0320 06/27/21 0302 06/28/21 0251 06/29/21 0319 06/29/21 1451 06/30/21 0324  NA 141 143 142 146* 144 144  --  141  K 3.3* 3.9 3.7 2.7* 3.7 2.6* 3.0* 3.0*  CL 106 108 110 110 112* 110  --  110  CO2 25 25 23 25  21* 26  --  21*  GLUCOSE 95 127* 106* 118* 120* 118*  --  97  BUN 19 30* 53* 53* 57* 51*  --  41*  CREATININE 1.01 2.43* 2.82* 1.81* 2.09* 1.57*  --  1.29*  CALCIUM 8.3* 7.7* 6.8* 7.9* 8.1* 7.9*  --  7.9*  MG 2.1 1.9  --  2.4 2.7*  --   --  2.7*  PHOS 3.8 5.3*  --  2.5 2.3*  --   --  2.7    GFR: Estimated Creatinine Clearance: 77.6 mL/min (A) (by C-G formula based on SCr of 1.29 mg/dL (H)). Liver Function Tests: Recent Labs  Lab 06/24/21 0247  AST 12*  ALT 11  ALKPHOS 69  BILITOT 1.3*  PROT 7.4  ALBUMIN 3.1*    No results for input(s): LIPASE, AMYLASE in the last 168 hours.  No results for input(s): AMMONIA in the last 168 hours. Coagulation Profile: Recent Labs  Lab 06/24/21 0247  INR 1.1    Cardiac Enzymes: No results for input(s): CKTOTAL, CKMB, CKMBINDEX, TROPONINI in the last 168 hours. BNP (last 3 results) No results for input(s): PROBNP in the last 8760 hours. HbA1C: No results for input(s): HGBA1C in the last 72 hours.  CBG: Recent Labs  Lab 06/29/21 2051  GLUCAP 103*   Lipid Profile: No results for input(s): CHOL, HDL, LDLCALC, TRIG, CHOLHDL, LDLDIRECT in the last 72 hours. Thyroid Function Tests: No results for input(s): TSH, T4TOTAL, FREET4, T3FREE, THYROIDAB in the last 72 hours. Anemia Panel: No results for input(s): VITAMINB12, FOLATE, FERRITIN, TIBC, IRON, RETICCTPCT in the last 72 hours. Sepsis Labs: No results for input(s): PROCALCITON, LATICACIDVEN in the last 168 hours.  Recent Results (from the past 240 hour(s))  Resp Panel by RT-PCR (Flu A&B, Covid)  Nasopharyngeal Swab     Status: Abnormal   Collection Time: 06/23/21  1:12 PM   Specimen: Nasopharyngeal Swab; Nasopharyngeal(NP) swabs in vial transport medium  Result Value Ref Range Status   SARS Coronavirus 2 by RT PCR POSITIVE (A) NEGATIVE Final    Comment: RESULT CALLED TO, READ BACK BY AND VERIFIED WITH:  COBLE,S RN @1403  06/23/21 EDENSCA (NOTE) SARS-CoV-2 target nucleic acids are DETECTED.  The SARS-CoV-2 RNA is generally detectable in upper respiratory specimens during the acute phase of infection. Positive results are indicative of the presence of the identified virus, but do not rule out bacterial infection or co-infection with other pathogens not detected by the test. Clinical correlation with patient history and other diagnostic information is necessary to determine patient infection status. The expected result is Negative.  Fact Sheet for Patients: 08/24/21  Fact Sheet for Healthcare Providers: BloggerCourse.com  This test is not yet approved or cleared by the SeriousBroker.it FDA and  has been authorized for detection and/or diagnosis of SARS-CoV-2 by FDA under an Emergency Use Authorization (EUA).  This EUA will remain in effect (meaning this test can be  used) for the duration of  the COVID-19 declaration under Section 564(b)(1) of the Act, 21 U.S.C. section 360bbb-3(b)(1), unless the authorization is terminated or revoked sooner.     Influenza A by PCR NEGATIVE NEGATIVE Final   Influenza B by PCR NEGATIVE NEGATIVE Final    Comment: (NOTE) The Xpert Xpress SARS-CoV-2/FLU/RSV plus assay is intended as an aid in the diagnosis of influenza from Nasopharyngeal swab specimens and should not be used as a sole basis for treatment. Nasal washings and aspirates are unacceptable for Xpert Xpress SARS-CoV-2/FLU/RSV testing.  Fact Sheet for Patients: [place]  Fact Sheet for  Healthcare Providers: BloggerCourse.com  This test is not yet approved or cleared by the SeriousBroker.it FDA and has been authorized for detection and/or diagnosis of SARS-CoV-2 by FDA under an Emergency Use Authorization (EUA). This EUA will remain in effect (meaning this test can be used) for the duration of the COVID-19 declaration under Section 564(b)(1) of the Act, 21 U.S.C. section 360bbb-3(b)(1), unless the authorization is terminated or revoked.  Performed at [facility],  [address] [place], [place] [address]   Surgical PCR screen     Status: Abnormal   Collection Time: 06/24/21  1:09 AM   Specimen: Nasal Mucosa; Nasal Swab  Result Value Ref Range Status   MRSA, PCR POSITIVE (A) NEGATIVE Final    Comment: RESULT CALLED TO, READ BACK BY AND VERIFIED WITH: SHAW,C RN @0333  ON 06/24/21 JACKSON,K    Staphylococcus aureus POSITIVE (A) NEGATIVE Final    Comment: (NOTE) The Xpert SA Assay (FDA approved for NASAL specimens in patients 66 years of age and older), is one component of a comprehensive surveillance program. It is not intended to diagnose infection nor to guide or monitor treatment. Performed at [facility], [address] [place], [place] [address]   MRSA Next Gen by PCR, Nasal     Status: Abnormal   Collection Time: 06/24/21  3:38 PM   Specimen: Nasal Mucosa; Nasal Swab  Result Value Ref Range Status   MRSA by PCR Next Gen DETECTED (A) NOT DETECTED Final    Comment: RESULT CALLED TO, READ BACK BY AND VERIFIED WITH: YOUNTS,D. RN AT 1735 06/24/21 MULLINS,T (NOTE) The GeneXpert MRSA Assay (FDA approved for NASAL specimens only), is one component of a comprehensive MRSA colonization surveillance program. It is not intended to diagnose MRSA infection nor to guide or monitor treatment for MRSA infections. Test performance is not FDA approved in patients less than 66 years old. Performed at [facility], [address] [place], [place] [address]      Radiology Studies: DG CHEST PORT 1 VIEW  Result Date: 06/29/2021 CLINICAL DATA:  Abdominal pain and hypoxia. EXAM: PORTABLE CHEST 1 VIEW COMPARISON:  7022 FINDINGS: 1156 hours. Low lung volumes. The cardio pericardial silhouette is enlarged. Basilar atelectasis, left greater than right, is progressive in the interval. No pulmonary edema. Similar fullness right hilum likely vascular etiology. The visualized bony structures of the thorax show no acute abnormality. Telemetry leads overlie the chest. IMPRESSION: Low volume film with progressive bibasilar atelectasis, left greater than right. Electronically Signed   By: Kennith CenterEric  Mansell M.D.   On: 06/29/2021 13:53    Scheduled Meds:  chlorhexidine  15 mL Mouth Rinse BID   Chlorhexidine Gluconate Cloth  6 each Topical Daily   docusate sodium  100 mg Oral BID   heparin injection (subcutaneous)  5,000 Units Subcutaneous Q8H   lip balm  1 application Topical BID   mouth rinse  15 mL Mouth Rinse BID   melatonin  3 mg Oral QHS   potassium chloride  40 mEq Oral Q4H   psyllium  1 packet Oral BID   Continuous Infusions:  0.9 % NaCl with KCl 40 mEq / L 50 mL/hr at 06/30/21 0420   famotidine (PEPCID) IV 20 mg (06/30/21 1002)     LOS: 7 days    Time spent: 29 mins  Hughie Clossavi Haisley Arens, MD [facility]   If 7PM-7AM, please contact night-coverage

## 2021-06-30 NOTE — TOC Initial Note (Addendum)
Transition of Care [facility]) - Initial/Assessment Note    Patient Details  Name: Travis Calderon MRN: [id number] Date of Birth: [birth year]  Transition of [facility]) CM/SW Contact:    Darleene Cleaver, LCSW Phone Number: 06/30/2021, 4:15 PM  Clinical Narrative:                  Patient from home, plan to return back to home after going to SNF for short term rehab.  CSW to begin bed search once patient is closer to being medically ready for discharge.  Patient has been Covid+ since July 8th, he can not go to SNF until after July 18th.  Patient will also need insurance authorization prior to going to SNF.       Patient Goals and CMS Choice  Patient would like to go home, but needs SNF first before returning back home.      Expected Discharge Plan and Services                                                Prior Living Arrangements/Services                       Activities of Daily Living Home Assistive Devices/Equipment: Cane (specify quad or straight) (occasionally, due to hip pain) ADL Screening (condition at time of admission) Patient's cognitive ability adequate to safely complete daily activities?: Yes Is the patient deaf or have difficulty hearing?: No Does the patient have difficulty seeing, even when wearing glasses/contacts?: No Does the patient have difficulty concentrating, remembering, or making decisions?: No Patient able to express need for assistance with ADLs?: No Does the patient have difficulty dressing or bathing?: No Independently performs ADLs?: Yes (appropriate for developmental age) Communication: Independent Dressing (OT): Independent Grooming: Independent Feeding: Independent Bathing: Independent Toileting: Independent In/Out Bed: Independent Walks in Home: Independent Does the patient have difficulty walking or climbing stairs?: No Weakness of Legs: None Weakness of Arms/Hands: None  Permission Sought/Granted                   Emotional Assessment              Admission diagnosis:  Colonic mass [K63.89] Complete intestinal obstruction, unspecified cause (HCC) [K56.601] COVID [U07.1] Patient Active Problem List   Diagnosis Date Noted   Erectile dysfunction 06/24/2021   Achilles tendinitis 06/24/2021   Benign prostatic hyperplasia 06/24/2021   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 06/24/2021   Colostomy in place (HCC) 06/24/2021   MRSA (methicillin resistant Staphylococcus aureus) carrier 06/24/2021   Panniculus 06/24/2021   Colonic mass - sgimoid with obstruction 06/23/2021   Morbid obesity with BMI of 50.0-59.9, adult (HCC) 06/23/2021   Hypokalemia 06/23/2021   COVID-19 virus infection 06/23/2021   Hypertensive heart disease with heart failure (HCC) 04/07/2015   History of colonoscopy 12/17/2005   PCP:  [facility] Pharmacy:   [facility] [address], [address] [place] [address] Phone: [phone] Fax: [phone]  [facility] [phone] - [place], [place] - [address] AT [facility] [address] [place] [address] Phone: [phone] Fax: [phone]     Social Determinants of Health (SDOH) Interventions    Readmission Risk Interventions No flowsheet data found.

## 2021-06-30 NOTE — [place] FL2 (Addendum)
[place] MEDICAID FL2 LEVEL OF CARE SCREENING TOOL     IDENTIFICATION  Patient Name: Travis Calderon Birthdate: [birth year] Sex: male Admission Date (Current Location): 06/23/2021  [facility] and [place] Number:  Producer, television/film/video and Address:  [facility],  [address] [place], [place] [address]      Provider Number: [id number]  Attending Physician Name and Address:  Hughie Closs, MD  Relative Name and Phone Number:  [name]   [phone]  perguese,felisa Sister   [phone]  Louie Casa   [phone]    Current Level of Care: Hospital Recommended Level of Care: Skilled Nursing Facility Prior Approval Number:    Date Approved/Denied:   PASRR Number: [id number]  Discharge Plan: SNF    Current Diagnoses: Patient Active Problem List   Diagnosis Date Noted   Erectile dysfunction 06/24/2021   Achilles tendinitis 06/24/2021   Benign prostatic hyperplasia 06/24/2021   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 06/24/2021   Col[facility] in [facility]) 06/24/2021   MRSA (methicillin resistant Staphylococcus aureus) carrier 06/24/2021   Panniculus 06/24/2021   Colonic mass - sgimoid with obstruction 06/23/2021   Morbid obesity with BMI of 50.0-59.9, adult (HCC) 06/23/2021   Hypokalemia 06/23/2021   COVID-19 virus infection 06/23/2021   Hypertensive heart disease with heart failure (HCC) 04/07/2015   History of colonoscopy 12/17/2005    Orientation RESPIRATION BLADDER Height & Weight     Self, Time, Situation, Place  Normal Continent Weight: (!) 310 lb 13.6 oz (141 kg) Height:  5\' 7"  (170.2 cm)  BEHAVIORAL SYMPTOMS/MOOD NEUROLOGICAL BOWEL NUTRITION STATUS      Continent Diet  AMBULATORY STATUS COMMUNICATION OF NEEDS Skin   Limited Assist Verbally Surgical wounds Wound Vac                       Personal Care Assistance Level of Assistance  Bathing, Dressing, Feeding Bathing Assistance: Limited  assistance Feeding assistance: Independent Dressing Assistance: Limited assistance     Functional Limitations Info  Sight, Hearing, Speech Sight Info: Adequate Hearing Info: Adequate Speech Info: Adequate    SPECIAL CARE FACTORS FREQUENCY  PT (By licensed PT), OT (By licensed OT)     PT Frequency: Minimum 5x a week OT Frequency: Minimum 5x a week            Contractures Contractures Info: Not present    Additional Factors Info  Code Status, Allergies Code Status Info: Full Code Allergies Info: NKA           Current Medications (06/30/2021):  This is the current hospital active medication list Current Facility-Administered Medications  Medication Dose Route Frequency Provider Last Rate Last Admin   0.9 % NaCl with KCl 40 mEq / L  infusion   Intravenous Continuous 07/02/2021, RPH 50 mL/hr at 06/30/21 0420 Infusion Verify at 06/30/21 0420   albuterol (PROVENTIL) (2.5 MG/3ML) 0.083% nebulizer solution 2.5 mg  2.5 mg Nebulization Q6H PRN 07/02/21, DO       chlorhexidine (PERIDEX) 0.12 % solution 15 mL  15 mL Mouth Rinse BID Laqueta Due, MD   15 mL at 06/30/21 1003   Chlorhexidine Gluconate Cloth 2 % PADS 6 each  6 each Topical Daily 07/02/21, MD   6 each at 06/30/21 1003   diphenhydrAMINE (BENADRYL) injection 12.5-25 mg  12.5-25 mg Intravenous Q6H PRN 01-16-1985, MD       docusate sodium (COLACE) capsule 100 mg  100 mg Oral BID Karie Soda  R, PA-C   100 mg at 06/29/21 2200   famotidine (PEPCID) IVPB 20 mg premix  20 mg Intravenous Q24H Juliet Rude, PA-C 100 mL/hr at 06/30/21 1002 20 mg at 06/30/21 1002   heparin injection 5,000 Units  5,000 Units Subcutaneous Q8H Juliet Rude, PA-C   5,000 Units at 06/30/21 1424   HYDROmorphone (DILAUDID) injection 0.5-1 mg  0.5-1 mg Intravenous Q3H PRN Juliet Rude, PA-C       lip balm (CARMEX) ointment 1 application  1 application Topical BID Karie Soda, MD   1 application at 06/30/21 1004    magic mouthwash  15 mL Oral QID PRN Karie Soda, MD       MEDLINE mouth rinse  15 mL Mouth Rinse BID Cipriano Bunker, MD   15 mL at 06/30/21 1004   melatonin tablet 3 mg  3 mg Oral QHS Luiz Iron, NP   3 mg at 06/29/21 2200   menthol-cetylpyridinium (CEPACOL) lozenge 3 mg  1 lozenge Oral PRN Karie Soda, MD       metoprolol tartrate (LOPRESSOR) injection 5 mg  5 mg Intravenous Q6H PRN Karie Soda, MD       ondansetron [facility]) tablet 4 mg  4 mg Oral Q6H PRN Laqueta Due, DO       Or   ondansetron [facility]) injection 4 mg  4 mg Intravenous Q6H PRN Laqueta Due, DO   4 mg at 06/27/21 0746   oxyCODONE (Oxy IR/ROXICODONE) immediate release tablet 5-10 mg  5-10 mg Oral Q4H PRN Juliet Rude, PA-C       phenol (CHLORASEPTIC) mouth spray 2 spray  2 spray Mouth/Throat PRN Karie Soda, MD       polyethylene glycol (MIRALAX / GLYCOLAX) packet 17 g  17 g Oral Daily PRN Juliet Rude, PA-C       prochlorperazine (COMPAZINE) injection 5-10 mg  5-10 mg Intravenous Q4H PRN Karie Soda, MD       psyllium (HYDROCIL/METAMUCIL) 1 packet  1 packet Oral BID Kinsinger, De Blanch, MD   1 packet at 06/30/21 1135   simethicone (MYLICON) 40 MG/0.6ML suspension 80 mg  80 mg Oral QID PRN Karie Soda, MD         Discharge Medications: Please see discharge summary for a list of discharge medications.  Relevant Imaging Results:  Relevant Lab Results:   Additional Information SSN [id number]  Darleene Cleaver, LCSW

## 2021-06-30 NOTE — Consult Note (Signed)
WOC Nurse wound follow up Wound type:surgical     Measurement: 28cm x 5cm x 10cm per surgery PA Trixie Deis, reports this increase in depth at the distal aspect  Wound bed:80% clean/20% fibrinous at upper aspect of wound bed Drainage (amount, consistency, odor) moderate in VAC canister, serosanguinous  Periwound: intact  Dressing procedure/placement/frequency: NPWT dressing changed by CCS PA Trixie Deis.  I labeled for her 2pc of black foam used to fill wound bed.     WOC Nurse ostomy follow up Stoma type/location: LUQ, end colstomy Stomal assessment/size: 1 3/4" round, budded, pink, moist  Peristomal assessment: NA Output liquid green, increased volume PA request to check patient for Cdiff; NT in the room to obtain sample from pouch Ostomy pouching: 2pc. 2 3/4" requested he be switched over to high output pouch this is the 2nd request to change over and hook to BSD bc of volume and accurate measurements plus not overfilling the pouch. Patient does not yet tell staff when to come empty  Education provided: none; no family at bedside.  Patient is however becoming more engaged to learn Enrolled patient in [place] Secure Start Discharge program: Yes  3 high output pouches ordered 3 skin barriers and 2 2/4" pouches in the room and skin barrier rings.   WOC Nurse will follow along with you for continued support with ostomy teaching and care Shirle [facility], RN, [place], CNS, [place] [phone]

## 2021-07-01 ENCOUNTER — Inpatient Hospital Stay (HOSPITAL_COMMUNITY): Payer: Medicare Other

## 2021-07-01 DIAGNOSIS — K56609 Unspecified intestinal obstruction, unspecified as to partial versus complete obstruction: Secondary | ICD-10-CM | POA: Diagnosis not present

## 2021-07-01 LAB — BASIC METABOLIC PANEL
Anion gap: 8 (ref 5–15)
BUN: 34 mg/dL — ABNORMAL HIGH (ref 8–23)
CO2: 18 mmol/L — ABNORMAL LOW (ref 22–32)
Calcium: 8 mg/dL — ABNORMAL LOW (ref 8.9–10.3)
Chloride: 115 mmol/L — ABNORMAL HIGH (ref 98–111)
Creatinine, Ser: 1.15 mg/dL (ref 0.61–1.24)
GFR, Estimated: >60 mL/min (ref >60)
Glucose, Bld: 113 mg/dL — ABNORMAL HIGH (ref 70–99)
Potassium: 3.7 mmol/L (ref 3.5–5.1)
Sodium: 141 mmol/L (ref 135–145)

## 2021-07-01 LAB — CBC WITH DIFFERENTIAL/PLATELET
Abs Immature Granulocytes: 0.6 10*3/uL — ABNORMAL HIGH (ref 0.00–0.07)
Basophils Absolute: 0.2 10*3/uL — ABNORMAL HIGH (ref 0.0–0.1)
Basophils Relative: 1 %
Eosinophils Absolute: 0.2 10*3/uL (ref 0.0–0.5)
Eosinophils Relative: 1 %
HCT: 41.8 % (ref 39.0–52.0)
Hemoglobin: 13.6 g/dL (ref 13.0–17.0)
Immature Granulocytes: 3 %
Lymphocytes Relative: 5 %
Lymphs Abs: 0.9 10*3/uL (ref 0.7–4.0)
MCH: 29.7 pg (ref 26.0–34.0)
MCHC: 32.5 g/dL (ref 30.0–36.0)
MCV: 91.3 fL (ref 80.0–100.0)
Monocytes Absolute: 1.3 10*3/uL — ABNORMAL HIGH (ref 0.1–1.0)
Monocytes Relative: 7 %
Neutro Abs: 16.7 10*3/uL — ABNORMAL HIGH (ref 1.7–7.7)
Neutrophils Relative %: 83 %
Platelets: 217 10*3/uL (ref 150–400)
RBC: 4.58 MIL/uL (ref 4.22–5.81)
RDW: 14.8 % (ref 11.5–15.5)
WBC: 19.9 10*3/uL — ABNORMAL HIGH (ref 4.0–10.5)
nRBC: 0.2 % (ref 0.0–0.2)

## 2021-07-01 MED ORDER — ALUM & MAG HYDROXIDE-SIMETH 200-200-20 MG/5ML PO SUSP
15.0000 mL | ORAL | Status: DC | PRN
  Administered 2021-07-01: 15 mL via ORAL
  Filled 2021-07-01 (×2): qty 30

## 2021-07-01 MED ORDER — IOHEXOL 9 MG/ML PO SOLN
500.0000 mL | ORAL | Status: AC
  Administered 2021-07-01 (×2): 500 mL via ORAL

## 2021-07-01 MED ORDER — IOHEXOL 9 MG/ML PO SOLN
ORAL | Status: AC
  Filled 2021-07-01: qty 1000

## 2021-07-01 NOTE — Progress Notes (Signed)
PROGRESS NOTE    Hayven Fatima  ZOX:[id number] DOB: May 17, 1956 DOA: 06/23/2021 PCP: [facility]     Brief Narrative: This 65 years old male with PMH significant for obesity, diverticulosis presented to the ED with complaints of abdominal pain and abnormal bowel movements.  Patient reports for the last several days he has had generalized abdominal pain associated with distention, nausea but denies any vomiting.  He also report over the past several months, he has had abnormal bowel movement,  he describes it as more frequent and does not appear same.  He regularly follows up with PCP,  does not take any take medications routinely. He [place]  He is found to be COVID-positive, CT abdomen shows large obstructing colonic mass.  Patient was started on pain medication. General surgery was consulted. Patient is transferred to [facility] and patient underwent Open sigmoid colectomy with end colostomy.  Postoperative course complicated by ATN,  Nephrology consulted.  Further detailed hospitalization course as below.  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 Colonic Mass/POA, ileus: CT abdomen pelvis showed large obstructing colonic mass. He reported having colon[place] in [place],  he does not know the results. General surgery was consulted. Patient underwent open sigmoid colectomy with end colostomy on 06/24/21. Postoperative course complicated by ileus, recommended NG tube, IV  hydration, pain control.. Continue colostomy care and training. NG tube was removed and started on clear liquid diet but patient had emesis overnight yesterday and he was put back to n.p.o. patient.  Improved and started on clear liquid diet and advance to regular diet which he has been tolerating.  Was noted to have green liquid discharge from colostomy bag yesterday, suspicious of C. difficile along with worsening leukocytosis and low-grade fever, C. difficile was checked and was negative.  No abdominal pain today.  Has semisolid stool in the colostomy bag today.  Seen by general surgery and plan to repeat CT abdomen due to worsening leukocytosis again today.  COVID+: Incidental positive, chest x-ray unremarkable.  Remains asymptomatic.  No indication for treatment. Patient has briefly required oxygen post op but then was back to room air.  X-ray repeated on 06/29/2021 once again did not show any COVID pathology.  He is now on room air.  Acute kidney injury Likely prerenal: Serum creatinine up from 1.01> 2.43 > 2.82> 1.81> 2.09 > 1.57> 1.29> 1.15 likely prerenal postoperatively.  Now resolved.  CT abdomen with no hydronephrosis.  Continue normal saline.  Hypotension:  Blood pressure controlled.  Continue midodrine 5 mg 3 times daily.  Hypokalemia: Now resolved.  Morbid obesity:  recommend diet and weight loss.  Hypophosphatemia: Resolved.  DVT prophylaxis:  SCDs Code Status: Full code. Family Communication: Daughter at bedside.  Plan of care discussed with both of them Disposition Plan:   Status is: Inpatient  Remains inpatient appropriate because:Inpatient level of care appropriate due to severity of illness  Dispo: The patient is from: Home  Anticipated d/c is to:  SNF,               Patient currently is not medically stable to d/c.   Difficult to place patient No   Consultants:  General surgery Nephrology  Procedures: Open sigmoid colectomy with end  colostomy Antimicrobials:  Anti-infectives (From admission, onward)    Start     Dose/Rate Route Frequency Ordered Stop   06/24/21 2200  cefoTEtan (CEFOTAN) 2 g in sodium chloride 0.9 % 100 mL IVPB        2 g 200 mL/hr over 30 Minutes Intravenous Every 12 hours 06/24/21 1514 06/24/21 2109   06/24/21 0830  cefoTEtan (CEFOTAN) 2 g in sodium chloride 0.9 % 100 mL IVPB        2 g 200 mL/hr over 30 Minutes Intravenous On call to O.R. 06/24/21 3419 06/24/21 1117        Subjective: Seen and examined.  Daughter at the bedside.  He has no complaint other than mild sore throat.  Oral cavity checked, no signs of candidiasis.  Objective: Vitals:   06/30/21 0522 06/30/21 1252 06/30/21 2136 07/01/21 0559  BP: 103/69 109/81 (!) 100/57 104/72  Pulse: 89 86 89 83  Resp: 16 18 18 17   Temp: 100 F (37.8 C) 98.1 F (36.7 C) 98.1 F (36.7 C) 98 F (36.7 C)  TempSrc: Oral   Oral  SpO2: 92% 95% 100% 100%  Weight:    (!) 142.4 kg  Height:        Intake/Output Summary (Last 24 hours) at 07/01/2021 1042 Last data filed at 07/01/2021 0604 Gross per 24 hour  Intake 499.96 ml  Output 4750 ml  Net -4250.04 ml    Filed Weights   06/24/21 1500 06/30/21 0500 07/01/21 0559  Weight: (!) 144.5 kg (!) 141 kg (!) 142.4 kg    Examination: General exam: Appears calm and comfortable, morbidly obese Respiratory system: Diminished breath sounds, respiratory effort normal. Cardiovascular system: S1 & S2 heard, RRR. No JVD, murmurs, rubs, gallops or clicks.  Trace pitting edema bilateral lower extremity. Gastrointestinal system: Abdomen is obese but nondistended, soft and nontender. No organomegaly or masses felt. Normal bowel sounds heard.  Semisolid stool in colostomy bag noted. Central nervous system: Alert and oriented. No focal neurological deficits. Extremities: Symmetric 5 x 5 power. Skin: No rashes, lesions or ulcers.    Data Reviewed: I have personally reviewed following labs and imaging  studies  CBC: Recent Labs  Lab 06/27/21 0302 06/28/21 0251 06/29/21 0319 06/30/21 0324 07/01/21 0237  WBC 4.8 7.8 11.6* 16.0* 19.9*  NEUTROABS  --   --  9.0* 12.9* 16.7*  HGB 13.3 13.1 12.6* 12.9* 13.6  HCT 40.1 39.6 36.9* 38.4* 41.8  MCV 87.7 88.4 85.8 87.3 91.3  PLT 233 227 229 220 217    Basic Metabolic Panel: Recent Labs  Lab 06/25/21 0226 06/26/21 0320 06/27/21 0302 06/28/21 0251 06/29/21 0319 06/29/21 1451 06/30/21 0324 06/30/21 1607 07/01/21 0237  NA 143   < > 146* 144 144  --  141  --  141  K 3.9   < > 2.7* 3.7 2.6* 3.0* 3.0* 3.4* 3.7  CL 108   < > 110 112* 110  --  110  --  115*  CO2 25   < > 25 21* 26  --  21*  --  18*  GLUCOSE 127*   < > 118* 120* 118*  --  97  --  113*  BUN 30*   < >  53* 57* 51*  --  41*  --  34*  CREATININE 2.43*   < > 1.81* 2.09* 1.57*  --  1.29*  --  1.15  CALCIUM 7.7*   < > 7.9* 8.1* 7.9*  --  7.9*  --  8.0*  MG 1.9  --  2.4 2.7*  --   --  2.7*  --   --   PHOS 5.3*  --  2.5 2.3*  --   --  2.7  --   --    < > = values in this interval not displayed.    GFR: Estimated Creatinine Clearance: 87.5 mL/min (by C-G formula based on SCr of 1.15 mg/dL). Liver Function Tests: No results for input(s): AST, ALT, ALKPHOS, BILITOT, PROT, ALBUMIN in the last 168 hours.  No results for input(s): LIPASE, AMYLASE in the last 168 hours.  No results for input(s): AMMONIA in the last 168 hours. Coagulation Profile: No results for input(s): INR, PROTIME in the last 168 hours.  Cardiac Enzymes: No results for input(s): CKTOTAL, CKMB, CKMBINDEX, TROPONINI in the last 168 hours. BNP (last 3 results) No results for input(s): PROBNP in the last 8760 hours. HbA1C: No results for input(s): HGBA1C in the last 72 hours.  CBG: Recent Labs  Lab 06/29/21 2051  GLUCAP 103*    Lipid Profile: No results for input(s): CHOL, HDL, LDLCALC, TRIG, CHOLHDL, LDLDIRECT in the last 72 hours. Thyroid Function Tests: No results for input(s): TSH, T4TOTAL,  FREET4, T3FREE, THYROIDAB in the last 72 hours. Anemia Panel: No results for input(s): VITAMINB12, FOLATE, FERRITIN, TIBC, IRON, RETICCTPCT in the last 72 hours. Sepsis Labs: No results for input(s): PROCALCITON, LATICACIDVEN in the last 168 hours.  Recent Results (from the past 240 hour(s))  Resp Panel by RT-PCR (Flu A&B, Covid) Nasopharyngeal Swab     Status: Abnormal   Collection Time: 06/23/21  1:12 PM   Specimen: Nasopharyngeal Swab; Nasopharyngeal(NP) swabs in vial transport medium  Result Value Ref Range Status   SARS Coronavirus 2 by RT PCR POSITIVE (A) NEGATIVE Final    Comment: RESULT CALLED TO, READ BACK BY AND VERIFIED WITH:  COBLE,S RN @1403  06/23/21 EDENSCA (NOTE) SARS-CoV-2 target nucleic acids are DETECTED.  The SARS-CoV-2 RNA is generally detectable in upper respiratory specimens during the acute phase of infection. Positive results are indicative of the presence of the identified virus, but do not rule out bacterial infection or co-infection with other pathogens not detected by the test. Clinical correlation with patient history and other diagnostic information is necessary to determine patient infection status. The expected result is Negative.  Fact Sheet for Patients: BloggerCourse.comhttps://www.fda.gov/media/152166/download  Fact Sheet for Healthcare Providers: SeriousBroker.ithttps://www.fda.gov/media/152162/download  This test is not yet approved or cleared by the [place] FDA and  has been authorized for detection and/or diagnosis of SARS-CoV-2 by FDA under an Emergency Use Authorization (EUA).  This EUA will remain in effect (meaning this test can be  used) for the duration of  the COVID-19 declaration under Section 564(b)(1) of the Act, 21 U.S.C. section 360bbb-3(b)(1), unless the authorization is terminated or revoked sooner.     Influenza A by PCR NEGATIVE NEGATIVE Final   Influenza B by PCR NEGATIVE NEGATIVE Final    Comment: (NOTE) The Xpert Xpress SARS-CoV-2/FLU/RSV plus  assay is intended as an aid in the diagnosis of influenza from Nasopharyngeal swab specimens and should not be used as a sole basis for treatment. Nasal washings and aspirates are unacceptable for Xpert Xpress SARS-CoV-2/FLU/RSV testing.  Fact Sheet for Patients: BloggerCourse.com  Fact Sheet for Healthcare Providers: SeriousBroker.it  This test is not yet approved or cleared by the [place] FDA and has been authorized for detection and/or diagnosis of SARS-CoV-2 by FDA under an Emergency Use Authorization (EUA). This EUA will remain in effect (meaning this test can be used) for the duration of the COVID-19 declaration under Section 564(b)(1) of the Act, 21 U.S.C. section 360bbb-3(b)(1), unless the authorization is terminated or revoked.  Performed at [facility], [address] [place], [place] [address]   Surgical PCR screen     Status: Abnormal   Collection Time: 06/24/21  1:09 AM   Specimen: Nasal Mucosa; Nasal Swab  Result Value Ref Range Status   MRSA, PCR POSITIVE (A) NEGATIVE Final    Comment: RESULT CALLED TO, READ BACK BY AND VERIFIED WITH: SHAW,C RN @0333  ON 06/24/21 JACKSON,K    Staphylococcus aureus POSITIVE (A) NEGATIVE Final    Comment: (NOTE) The Xpert SA Assay (FDA approved for NASAL specimens in patients 85 years of age and older), is one component of a comprehensive surveillance program. It is not intended to diagnose infection nor to guide or monitor treatment. Performed at [facility], [address], [place], [place]   MRSA Next Gen by PCR, Nasal     Status: Abnormal   Collection Time: 06/24/21  3:38 PM   Specimen: Nasal Mucosa; Nasal Swab  Result Value Ref Range Status   MRSA by PCR Next Gen DETECTED (A) NOT DETECTED Final    Comment: RESULT CALLED TO, READ BACK BY AND VERIFIED WITH: YOUNTS,D. RN AT 1735 06/24/21 MULLINS,T (NOTE) The GeneXpert MRSA  Assay (FDA approved for NASAL specimens only), is one component of a comprehensive MRSA colonization surveillance program. It is not intended to diagnose MRSA infection nor to guide or monitor treatment for MRSA infections. Test performance is not FDA approved in patients less than 45 years old. Performed at [facility], [address] [place], [place]   C Difficile Quick Screen (NO PCR Reflex)     Status: None   Collection Time: 06/30/21 11:53 AM   Specimen: STOOL  Result Value Ref Range Status   C Diff antigen NEGATIVE NEGATIVE Final   C Diff toxin NEGATIVE NEGATIVE Final   C Diff interpretation No C. difficile detected.  Final    Comment: Performed at [facility], [address] [place], [place]     Radiology Studies: DG CHEST PORT 1 VIEW  Result Date: 06/29/2021 CLINICAL DATA:  Abdominal pain and hypoxia. EXAM: PORTABLE CHEST 1 VIEW COMPARISON:  7022 FINDINGS: 1156 hours. Low lung volumes. The cardio pericardial silhouette is enlarged. Basilar atelectasis, left greater than right, is progressive in the interval. No pulmonary edema. Similar fullness right hilum likely vascular etiology. The visualized bony structures of the thorax show no acute abnormality. Telemetry leads overlie the chest. IMPRESSION: Low volume film with progressive bibasilar atelectasis, left greater than right. Electronically Signed   By: [id number] M.D.   On: 06/29/2021 13:53    Scheduled Meds:  chlorhexidine  15 mL Mouth Rinse BID   docusate sodium  100 mg Oral BID   heparin injection (subcutaneous)  5,000 Units Subcutaneous Q8H   lip balm  1 application Topical BID   mouth rinse  15 mL Mouth Rinse BID   melatonin  3 mg Oral QHS   psyllium  1 packet Oral BID  Continuous Infusions:  0.9 % NaCl with KCl 40 mEq / L 50 mL/hr at 07/01/21 0449   famotidine (PEPCID) IV 20 mg (07/01/21 0927)     LOS: 8 days    Time spent: 28 mins  Hughie Closs, MD [facility]   If 7PM-7AM, please contact night-coverage

## 2021-07-01 NOTE — Progress Notes (Signed)
End of Shift:  Patient resting in bed at shift change. No signs of distress and call bell in reach. Bed low and locked. Report given to Gladys Damme, Charity fundraiser.

## 2021-07-01 NOTE — Progress Notes (Deleted)
Patient stated that she felt like she had to urinate but was having difficulty starting. NT bladder scanned patient but it revealed there was 0 ml in bladder. Will continue to monitor.

## 2021-07-01 NOTE — Progress Notes (Signed)
7 Days Post-Op   Subjective/Chief Complaint: Doing ok Pain well controlled  No SOB or cough  Tolerating diet    Objective: Vital signs in last 24 hours: Temp:  [98 F (36.7 C)-98.1 F (36.7 C)] 98 F (36.7 C) (07/16 0559) Pulse Rate:  [83-89] 83 (07/16 0559) Resp:  [17-18] 17 (07/16 0559) BP: (100-109)/(57-81) 104/72 (07/16 0559) SpO2:  [95 %-100 %] 100 % (07/16 0559) Weight:  [142.4 kg] 142.4 kg (07/16 0559) Last BM Date: 06/30/21  Intake/Output from previous day: 07/15 0701 - 07/16 0700 In: 599.9 [I.V.:549.9; IV Piggyback:50] Out: 4950 [Urine:1200; Stool:3750] Intake/Output this shift: No intake/output data recorded.   Gen: NAD Resp: nonlabored Card: RRR Abd: soft, vac in place, left ostomy patent with liquid output Lab Results:  Recent Labs    06/30/21 0324 07/01/21 0237  WBC 16.0* 19.9*  HGB 12.9* 13.6  HCT 38.4* 41.8  PLT 220 217   BMET Recent Labs    06/30/21 0324 06/30/21 1607 07/01/21 0237  NA 141  --  141  K 3.0* 3.4* 3.7  CL 110  --  115*  CO2 21*  --  18*  GLUCOSE 97  --  113*  BUN 41*  --  34*  CREATININE 1.29*  --  1.15  CALCIUM 7.9*  --  8.0*   PT/INR No results for input(s): LABPROT, INR in the last 72 hours. ABG No results for input(s): PHART, HCO3 in the last 72 hours.  Invalid input(s): PCO2, PO2  Studies/Results: DG CHEST PORT 1 VIEW  Result Date: 06/29/2021 CLINICAL DATA:  Abdominal pain and hypoxia. EXAM: PORTABLE CHEST 1 VIEW COMPARISON:  7022 FINDINGS: 1156 hours. Low lung volumes. The cardio pericardial silhouette is enlarged. Basilar atelectasis, left greater than right, is progressive in the interval. No pulmonary edema. Similar fullness right hilum likely vascular etiology. The visualized bony structures of the thorax show no acute abnormality. Telemetry leads overlie the chest. IMPRESSION: Low volume film with progressive bibasilar atelectasis, left greater than right. Electronically Signed   By: Kennith Center M.D.   On:  06/29/2021 13:53    Anti-infectives: Anti-infectives (From admission, onward)    Start     Dose/Rate Route Frequency Ordered Stop   06/24/21 2200  cefoTEtan (CEFOTAN) 2 g in sodium chloride 0.9 % 100 mL IVPB        2 g 200 mL/hr over 30 Minutes Intravenous Every 12 hours 06/24/21 1514 06/24/21 2109   06/24/21 0830  cefoTEtan (CEFOTAN) 2 g in sodium chloride 0.9 % 100 mL IVPB        2 g 200 mL/hr over 30 Minutes Intravenous On call to O.R. 06/24/21 0733 06/24/21 1117       Assessment/Plan: s/p Procedure(s): COLECTOMY WITH COLOSTOMY CREATION/HARTMANN PROCEDURE, SIGMOID (N/A) Obstructing sigmoid colon mass POD 7 s/p open sigmoid colectomy with end colostomy 06/24/21 Dr. Freida Busman   Perforated diverticulitis - pathology consistent with diverticulitis - needs to mobilize, PT/OT - WOC following for new colostomy and VAC - drain with SS fluid - continue for now - pain well controlled currently    Hypokalemia - replace per TRH High output ostomy - advance diet and add metamucil, continue IVF and watch fluid balance C diff negative   Uptrending leukocytosis - continue to monitor, drain, wound and ostomy improving, tolerating diet. repeat CT scan . Could also be related to other issues like his viral pneumonia   AKI - Cr 1.15 from 1.8 improving, nephrology following - watch I/O  COVID + -  per TRH Morbid Obesity - BMI 49.89 BPH - toelrated foley removal   FEN: carb mod diet, IVF per TRH/nephrology VTE: SCDs, SQH ID: cefotetan x2 doses   Plan:  advancing diet, watching output of colostomy, continue to ambulate check CT due to leukocytosis- no IV contrast due to recent renal issues PO only   LOS: 8 days    Dortha Schwalbe MD  07/01/2021

## 2021-07-01 NOTE — Progress Notes (Signed)
C. Diff negative. Enteric precautions removed as per protocol. Bradd Burner, RN

## 2021-07-01 NOTE — Progress Notes (Signed)
Occupational Therapy Treatment Patient Details Name: Travis Calderon MRN: [id number] DOB: [birth year] Today's Date: 07/01/2021    History of present illness This 65 years old male with PMH significant for obesity, diverticulosis presented to the ED 06/23/21 with complaints of abdominal pain and abnormal bowel movements.He is found to be COVID-positive, CT abdomen shows large obstructing colonic mass.  Patient underwent Open sigmoid colectomy with end colostomy.on 06/24/21.Incisional wound VAC given massive distention, shock, contamination.  positive for covid   OT comments  Treatment focused on ambulating to sink and washing face. Patient min guard to supervision with use of RW. +2 for line/lead management and safety. Patient able to stand at sink approx 2 min. Ambulated a lap around room while up. Mod assist (heavy legs) for return to supine.    Follow Up Recommendations  SNF    Equipment Recommendations  Other (comment) (TBD)    Recommendations for Other Services      Precautions / Restrictions Precautions Precautions: Fall Precaution Comments: colostomy to catheter bag, right JP drain, VAC lower abd, urinary urgency, may have a nose bleed Restrictions Weight Bearing Restrictions: No       Mobility Bed Mobility Overal bed mobility: Needs Assistance Bed Mobility: Sit to Supine       Sit to supine: Mod assist   General bed mobility comments: Mod assist for LEs to return supine (heavy legs)    Transfers Overall transfer level: Needs assistance Equipment used: Rolling walker (2 wheeled) Transfers: Sit to/from Stand Sit to Stand: Min guard         General transfer comment: Min guard to supervision to ambulate slowly in room with RW. Limited by space and COVID restrictions.    Balance Overall balance assessment: Mild deficits observed, not formally tested Sitting-balance support: No upper extremity supported Sitting balance-Leahy Scale: Fair     Standing balance  support: During functional activity;Bilateral upper extremity supported Standing balance-Leahy Scale: Fair                             ADL either performed or assessed with clinical judgement   ADL Overall ADL's : Needs assistance/impaired     Grooming: Standing;Wash/dry face Grooming Details (indicate cue type and reason): stood at sink to allow water to warm and wash face. After 1.5 min standing patinet leaned on sink to complete job.                                     Vision Patient Visual Report: No change from baseline     Perception     Praxis      Cognition Arousal/Alertness: Awake/alert Behavior During Therapy: WFL for tasks assessed/performed Overall Cognitive Status: Within Functional Limits for tasks assessed                                          Exercises     Shoulder Instructions       General Comments      Pertinent Vitals/ Pain       Pain Assessment: Faces Faces Pain Scale: Hurts little more Pain Location: abdomen Pain Descriptors / Indicators: Discomfort;Grimacing Pain Intervention(s): Limited activity within patient's tolerance;Monitored during session  Home Living  Prior Functioning/Environment              Frequency  Min 2X/week        Progress Toward Goals  OT Goals(current goals can now be found in the care plan section)  Progress towards OT goals: Progressing toward goals  Acute Rehab OT Goals Patient Stated Goal: return to independence OT Goal Formulation: With patient Time For Goal Achievement: 07/12/21 Potential to Achieve Goals: Good  Plan Discharge plan remains appropriate    Co-evaluation                 AM-PAC OT "6 Clicks" Daily Activity     Outcome Measure   Help from another person eating meals?: None Help from another person taking care of personal grooming?: A Little Help from another person  toileting, which includes using toliet, bedpan, or urinal?: A Lot Help from another person bathing (including washing, rinsing, drying)?: A Lot Help from another person to put on and taking off regular upper body clothing?: A Little Help from another person to put on and taking off regular lower body clothing?: A Lot 6 Click Score: 16    End of Session Equipment Utilized During Treatment: Rolling walker  OT Visit Diagnosis: Pain   Activity Tolerance Patient tolerated treatment well   Patient Left in bed;with call bell/phone within reach;with bed alarm set   Nurse Communication  (okay to see per RN)        Time: 1610-9604 OT Time Calculation (min): 22 min  Charges: OT General Charges $OT Visit: 1 Visit OT Treatments $Self Care/Home Management : 8-22 mins  Waldron Session, OTR/L Acute Care Rehab Services  Office [phone] Pager: [phone]    Kelli Churn 07/01/2021, 2:18 PM

## 2021-07-02 DIAGNOSIS — K56609 Unspecified intestinal obstruction, unspecified as to partial versus complete obstruction: Secondary | ICD-10-CM | POA: Diagnosis not present

## 2021-07-02 LAB — CBC WITH DIFFERENTIAL/PLATELET
Abs Immature Granulocytes: 0.5 10*3/uL — ABNORMAL HIGH (ref 0.00–0.07)
Basophils Absolute: 0.1 10*3/uL (ref 0.0–0.1)
Basophils Relative: 0 %
Eosinophils Absolute: 0.2 10*3/uL (ref 0.0–0.5)
Eosinophils Relative: 1 %
HCT: 37 % — ABNORMAL LOW (ref 39.0–52.0)
Hemoglobin: 12.5 g/dL — ABNORMAL LOW (ref 13.0–17.0)
Immature Granulocytes: 3 %
Lymphocytes Relative: 6 %
Lymphs Abs: 1 10*3/uL (ref 0.7–4.0)
MCH: 29.3 pg (ref 26.0–34.0)
MCHC: 33.8 g/dL (ref 30.0–36.0)
MCV: 86.9 fL (ref 80.0–100.0)
Monocytes Absolute: 1.2 10*3/uL — ABNORMAL HIGH (ref 0.1–1.0)
Monocytes Relative: 7 %
Neutro Abs: 15.4 10*3/uL — ABNORMAL HIGH (ref 1.7–7.7)
Neutrophils Relative %: 83 %
Platelets: 229 10*3/uL (ref 150–400)
RBC: 4.26 MIL/uL (ref 4.22–5.81)
RDW: 14.7 % (ref 11.5–15.5)
WBC: 18.4 10*3/uL — ABNORMAL HIGH (ref 4.0–10.5)
nRBC: 0 % (ref 0.0–0.2)

## 2021-07-02 LAB — PREALBUMIN: Prealbumin: 7.9 mg/dL — ABNORMAL LOW (ref 18–38)

## 2021-07-02 LAB — BASIC METABOLIC PANEL
Anion gap: 7 (ref 5–15)
BUN: 27 mg/dL — ABNORMAL HIGH (ref 8–23)
CO2: 19 mmol/L — ABNORMAL LOW (ref 22–32)
Calcium: 7.8 mg/dL — ABNORMAL LOW (ref 8.9–10.3)
Chloride: 114 mmol/L — ABNORMAL HIGH (ref 98–111)
Creatinine, Ser: 0.89 mg/dL (ref 0.61–1.24)
GFR, Estimated: >60 mL/min (ref >60)
Glucose, Bld: 108 mg/dL — ABNORMAL HIGH (ref 70–99)
Potassium: 3.5 mmol/L (ref 3.5–5.1)
Sodium: 140 mmol/L (ref 135–145)

## 2021-07-02 NOTE — Progress Notes (Signed)
Patient up in room walking laps to door and back with walker.  Tolerating well.

## 2021-07-02 NOTE — Progress Notes (Signed)
8 Days Post-Op   Subjective/Chief Complaint: Doing well CT reviewed  No major change  Staple line  dots of air at Hartman's noted  Denies abdominal pain Ostomy functioning    Objective: Vital signs in last 24 hours: Temp:  [97.9 F (36.6 C)-98.9 F (37.2 C)] 98 F (36.7 C) (07/17 0456) Pulse Rate:  [83-84] 83 (07/17 0456) Resp:  [16-20] 20 (07/17 0456) BP: (99-113)/(66-86) 102/66 (07/17 0456) SpO2:  [98 %-99 %] 98 % (07/17 0456) Weight:  [141.5 kg] 141.5 kg (07/17 0458) Last BM Date: 07/01/21  Intake/Output from previous day: 07/16 0701 - 07/17 0700 In: -  Out: 2750 [Urine:750; Stool:2000] Intake/Output this shift: No intake/output data recorded.   Gen: NAD Resp: nonlabored Card: RRR Abd: soft, vac in place, left ostomy patent with liquid output Lab Results:  Recent Labs    07/01/21 0237 07/02/21 0340  WBC 19.9* 18.4*  HGB 13.6 12.5*  HCT 41.8 37.0*  PLT 217 229   BMET Recent Labs    07/01/21 0237 07/02/21 0340  NA 141 140  K 3.7 3.5  CL 115* 114*  CO2 18* 19*  GLUCOSE 113* 108*  BUN 34* 27*  CREATININE 1.15 0.89  CALCIUM 8.0* 7.8*   PT/INR No results for input(s): LABPROT, INR in the last 72 hours. ABG No results for input(s): PHART, HCO3 in the last 72 hours.  Invalid input(s): PCO2, PO2  Studies/Results: CT ABDOMEN PELVIS WO CONTRAST  Result Date: 07/01/2021 CLINICAL DATA:  Colonic obstruction status post colostomy with Hartmann pouch on 06/24/2021, increasing white blood cell count EXAM: CT ABDOMEN AND PELVIS WITHOUT CONTRAST TECHNIQUE: Multidetector CT imaging of the abdomen and pelvis was performed following the standard protocol without IV contrast. Unenhanced CT was performed per clinician order. Lack of IV contrast limits sensitivity and specificity, especially for evaluation of abdominal/pelvic solid viscera. COMPARISON:  06/27/2021, 06/23/2021 FINDINGS: Lower chest: No acute pleural or parenchymal lung disease. Hepatobiliary: High  attenuation material within the gallbladder may reflect gallbladder sludge or vicarious excretion of previously administered contrast. No evidence of cholelithiasis or cholecystitis. Unenhanced imaging of the liver is unremarkable. Pancreas: Unremarkable. No pancreatic ductal dilatation or surrounding inflammatory changes. Spleen: Normal in size without focal abnormality. Adrenals/Urinary Tract: No urinary tract calculi or obstructive uropathy within either kidney. There are small bilateral renal cortical cysts. The adrenals are unremarkable. The bladder is decompressed, limiting its evaluation. Stomach/Bowel: There has been interval resection of the obstructing sigmoid colon mass seen on prior CT, with diverting colostomy in the left lower quadrant. There are dilated gas-filled loops of small bowel within the left mid abdomen, measuring up to 3.9 cm in diameter with scattered gas fluid levels. Gas is seen throughout the bowel to the level of the colostomy, and findings are most consistent with postoperative ileus. There is significant distension of the stomach, and enteric catheter placement for gastric decompression could be considered. There is a small amount of gas adjacent to the staple line of the residual sigmoid colon, reference images 74 through 79 of series 2. There is no associated fluid collection or abscess identified on this limited unenhanced exam. Given recent surgical intervention, this is indeterminate, and could reflect postsurgical changes versus early sequela of suture line breakdown. Continued radiographic follow-up is recommended. Vascular/Lymphatic: No significant vascular findings are present. No enlarged abdominal or pelvic lymph nodes. The small pericolonic lymph node seen adjacent to the sigmoid colon mass on previous study are not appreciated on this exam. Reproductive: Prostate is unremarkable. Other: Surgical  drain is identified entering the abdomen in the right lower quadrant and  coiled within the lower pelvis near the site of sigmoid colon resection. Punctate foci of gas are seen within the anterior abdominal wall along the site of previous incision. No abdominal wall fluid collection or abscess. There is no free intraperitoneal fluid or free intraperitoneal gas. Mesenteric fat stranding is seen within the lower abdomen and pelvis at the site of prior colonic surgery. Musculoskeletal: No acute or destructive bony lesions. Severe right hip osteoarthritis again noted. Reconstructed images demonstrate no additional findings. IMPRESSION: 1. Interval resection of the obstructing sigmoid colon mass identified on prior CT, with left lower quadrant diverting colostomy as above. 2. Small amount of gas adjacent to the staple line at the site of sigmoid colon resection. Given recent surgical intervention, this could reflect residual postsurgical changes versus early suture line breakdown. No associated fluid collection or abscess identified on this unenhanced exam. Continued radiographic follow-up is recommended. 3. Likely postoperative ileus, with distended fluid-filled loops of small bowel and scattered gas fluid levels as above. Fluid-filled distended stomach is noted, an enteric catheter placement could be considered for gastric decompression. 4. attenuation material within the gallbladder, consistent with gallbladder sludge versus vicarious excretion of previously administered IV contrast. Electronically Signed   By: Sharlet Salina M.D.   On: 07/01/2021 16:48    Anti-infectives: Anti-infectives (From admission, onward)    Start     Dose/Rate Route Frequency Ordered Stop   06/24/21 2200  cefoTEtan (CEFOTAN) 2 g in sodium chloride 0.9 % 100 mL IVPB        2 g 200 mL/hr over 30 Minutes Intravenous Every 12 hours 06/24/21 1514 06/24/21 2109   06/24/21 0830  cefoTEtan (CEFOTAN) 2 g in sodium chloride 0.9 % 100 mL IVPB        2 g 200 mL/hr over 30 Minutes Intravenous On call to O.R. 06/24/21  0733 06/24/21 1117       Assessment/Plan: s/p Procedure(s): COLECTOMY WITH COLOSTOMY CREATION/HARTMANN PROCEDURE, SIGMOID (N/A) Obstructing sigmoid colon mass POD 7 s/p open sigmoid colectomy with end colostomy 06/24/21 Dr. Freida Busman   Perforated diverticulitis - pathology consistent with diverticulitis - needs to mobilize, PT/OT - WOC following for new colostomy and VAC - drain with SS fluid - continue for now - pain well controlled currently    Hypokalemia - replace per TR[facility] High output ostomy - advance diet and add metamucil, continue IVF and watch fluid balance C diff negative   Uptrending leukocytosis - continue to monitor, drain, wound and ostomy improving, tolerating diet. repeat CT scan . Could also be related to other issues like his viral pneumonia   AKI - nephrology following - watch I/O  COVID + - per [facility] Morbid Obesity - BMI 49.89 BPH - toelrated foley removal   FEN: carb mod diet, IVF per [facility]/nephrology VTE: SCDs, SQH ID: cefotetan x2 doses   Plan: CT negative for abscess   WBC down  Follow for now  Continue wound care and therapies    LOS: 9 days    Travis Calderon 07/02/2021

## 2021-07-02 NOTE — Progress Notes (Signed)
PROGRESS NOTE    Travis Calderon  RJJ:[id number] DOB: [birth year] DOA: 06/23/2021 PCP: [facility]     Brief Narrative: This 65 years old male with PMH significant for obesity, diverticulosis presented to the ED with complaints of abdominal pain and abnormal bowel movements.  Patient reports for the last several days he has had generalized abdominal pain associated with distention, nausea but denies any vomiting.  He also report over the past several months, he has had abnormal bowel movement,  he describes it as more frequent and does not appear same.  He regularly follows up with PCP,  does not take any take medications routinely. He [place] in [place]  He is found to be COVID-positive, CT abdomen shows large obstructing colonic mass.  Patient was started on pain medication. General surgery was consulted. Patient is transferred to [facility] and patient underwent Open sigmoid colectomy with end colostomy.  Postoperative course complicated by ATN,  Nephrology consulted.  Further detailed hospitalization course as below.  Assessment & Plan:   Principal Problem:   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 Active Problems:   Colonic mass - sgimoid with obstruction   Morbid obesity with BMI of 50.0-59.9, adult (HCC)   Hypokalemia   COVID-19 virus infection   Benign prostatic hyperplasia   Hypertensive heart disease with heart failure (HCC)   Colostomy in place [facility])   MRSA (methicillin resistant Staphylococcus aureus) carrier   Panniculus  Abdominal pain with irregular bowel movement. Colonic Mass/POA, ileus: CT abdomen pelvis showed large obstructing colonic mass. He reported having colon[place] in [place],  he does not know the results. General surgery was consulted. Patient underwent open sigmoid colectomy with end colostomy on 06/24/21. Postoperative course complicated by ileus, recommended NG tube, IV  hydration, pain control.. Continue colostomy care and training.  COVID+: Incidental positive, chest x-ray unremarkable.  Remains asymptomatic.  No indication for treatment. Patient has briefly required oxygen post op but then was back to room air.  X-ray repeated on 06/29/2021 once again did not show any COVID pathology.  He is now on room air.  Acute kidney injury Likely prerenal: Serum creatinine up from 1.01> 2.43 > 2.82> 1.81> 2.09 > 1.57> 1.29> 1.15 likely prerenal postoperatively.  Now resolved.  CT abdomen with no hydronephrosis.  Continue normal saline.  Hypotension:  Blood pressure controlled.  Continue midodrine 5 mg 3 times daily.  Hypokalemia: Now resolved.  Morbid obesity:  recommend diet and weight loss.  Hypophosphatemia: Resolved.  DVT prophylaxis:  SCDs Code Status: Full code. Family Communication: Daughter at bedside.  Plan of care discussed with both of them Disposition Plan:   Status is: Inpatient  Remains inpatient appropriate because:Inpatient level of care appropriate due to severity of illness  Dispo: The patient is from: Home              Anticipated d/c is to:  SNF,               Patient currently is not medically stable to d/c.   Difficult to place patient No   Consultants:  General surgery Nephrology  Procedures: Open sigmoid colectomy with end colostomy Antimicrobials:  Anti-infectives (From admission, onward)    Start     Dose/Rate Route Frequency Ordered Stop   06/24/21 2200  cefoTEtan (CEFOTAN) 2 g in sodium chloride 0.9 % 100 mL IVPB        2 g 200 mL/hr over  30 Minutes Intravenous Every 12 hours 06/24/21 1514 06/24/21 2109   06/24/21 0830  cefoTEtan (CEFOTAN) 2 g in sodium chloride 0.9 % 100 mL IVPB        2 g 200 mL/hr over 30 Minutes Intravenous On call to O.R. 06/24/21 0733 06/24/21 1117        Subjective: No complaints-- no nausea while eating breakfast  Objective: Vitals:   07/01/21 1259 07/01/21 2021 07/02/21 0456  07/02/21 0458  BP: 99/86 113/84 102/66   Pulse: 84 84 83   Resp: 16 20 20    Temp: 97.9 F (36.6 C) 98.9 F (37.2 C) 98 F (36.7 C)   TempSrc: Axillary     SpO2: 99% 99% 98%   Weight:    (!) 141.5 kg  Height:        Intake/Output Summary (Last 24 hours) at 07/02/2021 1147 Last data filed at 07/02/2021 0500 Gross per 24 hour  Intake --  Output 2750 ml  Net -2750 ml   Filed Weights   06/30/21 0500 07/01/21 0559 07/02/21 0458  Weight: (!) 141 kg (!) 142.4 kg (!) 141.5 kg    Examination:  General: Appearance:    Severely obese male in no acute distress     Lungs:     respirations unlabored  Heart:    Normal heart rate.    MS:   All extremities are intact.    Neurologic:   Awake, alert, oriented x 3. No apparent focal neurological           defect.       Data Reviewed: I have personally reviewed following labs and imaging studies  CBC: Recent Labs  Lab 06/28/21 0251 06/29/21 0319 06/30/21 0324 07/01/21 0237 07/02/21 0340  WBC 7.8 11.6* 16.0* 19.9* 18.4*  NEUTROABS  --  9.0* 12.9* 16.7* 15.4*  HGB 13.1 12.6* 12.9* 13.6 12.5*  HCT 39.6 36.9* 38.4* 41.8 37.0*  MCV 88.4 85.8 87.3 91.3 86.9  PLT 227 229 220 217 229   Basic Metabolic Panel: Recent Labs  Lab 06/27/21 0302 06/28/21 0251 06/29/21 0319 06/29/21 1451 06/30/21 0324 06/30/21 1607 07/01/21 0237 07/02/21 0340  NA 146* 144 144  --  141  --  141 140  K 2.7* 3.7 2.6* 3.0* 3.0* 3.4* 3.7 3.5  CL 110 112* 110  --  110  --  115* 114*  CO2 25 21* 26  --  21*  --  18* 19*  GLUCOSE 118* 120* 118*  --  97  --  113* 108*  BUN 53* 57* 51*  --  41*  --  34* 27*  CREATININE 1.81* 2.09* 1.57*  --  1.29*  --  1.15 0.89  CALCIUM 7.9* 8.1* 7.9*  --  7.9*  --  8.0* 7.8*  MG 2.4 2.7*  --   --  2.7*  --   --   --   PHOS 2.5 2.3*  --   --  2.7  --   --   --    GFR: Estimated Creatinine Clearance: 112.7 mL/min (by C-G formula based on SCr of 0.89 mg/dL). Liver Function Tests: No results for input(s): AST, ALT,  ALKPHOS, BILITOT, PROT, ALBUMIN in the last 168 hours.  No results for input(s): LIPASE, AMYLASE in the last 168 hours.  No results for input(s): AMMONIA in the last 168 hours. Coagulation Profile: No results for input(s): INR, PROTIME in the last 168 hours.  Cardiac Enzymes: No results for input(s): CKTOTAL, CKMB, CKMBINDEX, TROPONINI in the  last 168 hours. BNP (last 3 results) No results for input(s): PROBNP in the last 8760 hours. HbA1C: No results for input(s): HGBA1C in the last 72 hours.  CBG: Recent Labs  Lab 06/29/21 2051  GLUCAP 103*   Lipid Profile: No results for input(s): CHOL, HDL, LDLCALC, TRIG, CHOLHDL, LDLDIRECT in the last 72 hours. Thyroid Function Tests: No results for input(s): TSH, T4TOTAL, FREET4, T3FREE, THYROIDAB in the last 72 hours. Anemia Panel: No results for input(s): VITAMINB12, FOLATE, FERRITIN, TIBC, IRON, RETICCTPCT in the last 72 hours. Sepsis Labs: No results for input(s): PROCALCITON, LATICACIDVEN in the last 168 hours.  Recent Results (from the past 240 hour(s))  Resp Panel by RT-PCR (Flu A&B, Covid) Nasopharyngeal Swab     Status: Abnormal   Collection Time: 06/23/21  1:12 PM   Specimen: Nasopharyngeal Swab; Nasopharyngeal(NP) swabs in vial transport medium  Result Value Ref Range Status   SARS Coronavirus 2 by RT PCR POSITIVE (A) NEGATIVE Final    Comment: RESULT CALLED TO, READ BACK BY AND VERIFIED WITH:  COBLE,S RN @1403  06/23/21 EDENSCA (NOTE) SARS-CoV-2 target nucleic acids are DETECTED.  The SARS-CoV-2 RNA is generally detectable in upper respiratory specimens during the acute phase of infection. Positive results are indicative of the presence of the identified virus, but do not rule out bacterial infection or co-infection with other pathogens not detected by the test. Clinical correlation with patient history and other diagnostic information is necessary to determine patient infection status. The expected result is  Negative.  Fact Sheet for Patients: BloggerCourse.comhttps://www.fda.gov/media/152166/download  Fact Sheet for Healthcare Providers: SeriousBroker.ithttps://www.fda.gov/media/152162/download  This test is not yet approved or cleared by the [place] FDA and  has been authorized for detection and/or diagnosis of SARS-CoV-2 by FDA under an Emergency Use Authorization (EUA).  This EUA will remain in effect (meaning this test can be  used) for the duration of  the COVID-19 declaration under Section 564(b)(1) of the Act, 21 U.S.C. section 360bbb-3(b)(1), unless the authorization is terminated or revoked sooner.     Influenza A by PCR NEGATIVE NEGATIVE Final   Influenza B by PCR NEGATIVE NEGATIVE Final    Comment: (NOTE) The Xpert Xpress SARS-CoV-2/FLU/RSV plus assay is intended as an aid in the diagnosis of influenza from Nasopharyngeal swab specimens and should not be used as a sole basis for treatment. Nasal washings and aspirates are unacceptable for Xpert Xpress SARS-CoV-2/FLU/RSV testing.  Fact Sheet for Patients: BloggerCourse.comhttps://www.fda.gov/media/152166/download  Fact Sheet for Healthcare Providers: SeriousBroker.ithttps://www.fda.gov/media/152162/download  This test is not yet approved or cleared by the [place] FDA and has been authorized for detection and/or diagnosis of SARS-CoV-2 by FDA under an Emergency Use Authorization (EUA). This EUA will remain in effect (meaning this test can be used) for the duration of the COVID-19 declaration under Section 564(b)(1) of the Act, 21 U.S.C. section 360bbb-3(b)(1), unless the authorization is terminated or revoked.  Performed at [facility] [place], [address] [place], [place] [id number]   Surgical PCR screen     Status: Abnormal   Collection Time: 06/24/21  1:09 AM   Specimen: Nasal Mucosa; Nasal Swab  Result Value Ref Range Status   MRSA, PCR POSITIVE (A) NEGATIVE Final    Comment: RESULT CALLED TO, READ BACK BY AND VERIFIED WITH: SHAW,C RN @0333  ON 06/24/21  JACKSON,K    Staphylococcus aureus POSITIVE (A) NEGATIVE Final    Comment: (NOTE) The Xpert SA Assay (FDA approved for NASAL specimens in patients 66 years of age and older), is  one component of a comprehensive surveillance program. It is not intended to diagnose infection nor to guide or monitor treatment. Performed at [facility], [address] [place], [place] [address]   MRSA Next Gen by PCR, Nasal     Status: Abnormal   Collection Time: 06/24/21  3:38 PM   Specimen: Nasal Mucosa; Nasal Swab  Result Value Ref Range Status   MRSA by PCR Next Gen DETECTED (A) NOT DETECTED Final    Comment: RESULT CALLED TO, READ BACK BY AND VERIFIED WITH: YOUNTS,D. RN AT 1735 06/24/21 MULLINS,T (NOTE) The GeneXpert MRSA Assay (FDA approved for NASAL specimens only), is one component of a comprehensive MRSA colonization surveillance program. It is not intended to diagnose MRSA infection nor to guide or monitor treatment for MRSA infections. Test performance is not FDA approved in patients less than 22 years old. Performed at [facility], [address], [place], [place] [address]   C Difficile Quick Screen (NO PCR Reflex)     Status: None   Collection Time: 06/30/21 11:53 AM   Specimen: STOOL  Result Value Ref Range Status   C Diff antigen NEGATIVE NEGATIVE Final   C Diff toxin NEGATIVE NEGATIVE Final   C Diff interpretation No C. difficile detected.  Final    Comment: Performed at [facility], [address], [place], [place] [address]     Radiology Studies: CT ABDOMEN PELVIS WO CONTRAST  Result Date: 07/01/2021 CLINICAL DATA:  Colonic obstruction status post colostomy with Gertie Gowda pouch on 06/24/2021, increasing white blood cell count EXAM: CT ABDOMEN AND PELVIS WITHOUT CONTRAST TECHNIQUE: Multidetector CT imaging of the abdomen and pelvis was performed following the standard protocol without IV contrast. Unenhanced CT was  performed per clinician order. Lack of IV contrast limits sensitivity and specificity, especially for evaluation of abdominal/pelvic solid viscera. COMPARISON:  06/27/2021, 06/23/2021 FINDINGS: Lower chest: No acute pleural or parenchymal lung disease. Hepatobiliary: High attenuation material within the gallbladder may reflect gallbladder sludge or vicarious excretion of previously administered contrast. No evidence of cholelithiasis or cholecystitis. Unenhanced imaging of the liver is unremarkable. Pancreas: Unremarkable. No pancreatic ductal dilatation or surrounding inflammatory changes. Spleen: Normal in size without focal abnormality. Adrenals/Urinary Tract: No urinary tract calculi or obstructive uropathy within either kidney. There are small bilateral renal cortical cysts. The adrenals are unremarkable. The bladder is decompressed, limiting its evaluation. Stomach/Bowel: There has been interval resection of the obstructing sigmoid colon mass seen on prior CT, with diverting colostomy in the left lower quadrant. There are dilated gas-filled loops of small bowel within the left mid abdomen, measuring up to 3.9 cm in diameter with scattered gas fluid levels. Gas is seen throughout the bowel to the level of the colostomy, and findings are most consistent with postoperative ileus. There is significant distension of the stomach, and enteric catheter placement for gastric decompression could be considered. There is a small amount of gas adjacent to the staple line of the residual sigmoid colon, reference images 74 through 79 of series 2. There is no associated fluid collection or abscess identified on this limited unenhanced exam. Given recent surgical intervention, this is indeterminate, and could reflect postsurgical changes versus early sequela of suture line breakdown. Continued radiographic follow-up is recommended. Vascular/Lymphatic: No significant vascular findings are present. No enlarged abdominal or  pelvic lymph nodes. The small pericolonic lymph node seen adjacent to the sigmoid colon mass on previous study are not appreciated on this exam. Reproductive: Prostate is  unremarkable. Other: Surgical drain is identified entering the abdomen in the right lower quadrant and coiled within the lower pelvis near the site of sigmoid colon resection. Punctate foci of gas are seen within the anterior abdominal wall along the site of previous incision. No abdominal wall fluid collection or abscess. There is no free intraperitoneal fluid or free intraperitoneal gas. Mesenteric fat stranding is seen within the lower abdomen and pelvis at the site of prior colonic surgery. Musculoskeletal: No acute or destructive bony lesions. Severe right hip osteoarthritis again noted. Reconstructed images demonstrate no additional findings. IMPRESSION: 1. Interval resection of the obstructing sigmoid colon mass identified on prior CT, with left lower quadrant diverting colostomy as above. 2. Small amount of gas adjacent to the staple line at the site of sigmoid colon resection. Given recent surgical intervention, this could reflect residual postsurgical changes versus early suture line breakdown. No associated fluid collection or abscess identified on this unenhanced exam. Continued radiographic follow-up is recommended. 3. Likely postoperative ileus, with distended fluid-filled loops of small bowel and scattered gas fluid levels as above. Fluid-filled distended stomach is noted, an enteric catheter placement could be considered for gastric decompression. 4. attenuation material within the gallbladder, consistent with gallbladder sludge versus vicarious excretion of previously administered IV contrast. Electronically Signed   By: Sharlet Salina M.D.   On: 07/01/2021 16:48    Scheduled Meds:  chlorhexidine  15 mL Mouth Rinse BID   docusate sodium  100 mg Oral BID   heparin injection (subcutaneous)  5,000 Units Subcutaneous Q8H   lip  balm  1 application Topical BID   mouth rinse  15 mL Mouth Rinse BID   melatonin  3 mg Oral QHS   psyllium  1 packet Oral BID   Continuous Infusions:  0.9 % NaCl with KCl 40 mEq / L 50 mL/hr at 07/02/21 0223   famotidine (PEPCID) IV 20 mg (07/01/21 0927)     LOS: 9 days    Time spent: 28 mins  Joseph Art, DO [facility]   If 7PM-7AM, please contact night-coverage

## 2021-07-03 DIAGNOSIS — K56609 Unspecified intestinal obstruction, unspecified as to partial versus complete obstruction: Secondary | ICD-10-CM | POA: Diagnosis not present

## 2021-07-03 LAB — CBC WITH DIFFERENTIAL/PLATELET
Abs Immature Granulocytes: 0.32 10*3/uL — ABNORMAL HIGH (ref 0.00–0.07)
Basophils Absolute: 0 10*3/uL (ref 0.0–0.1)
Basophils Relative: 0 %
Eosinophils Absolute: 0.2 10*3/uL (ref 0.0–0.5)
Eosinophils Relative: 1 %
HCT: 35.4 % — ABNORMAL LOW (ref 39.0–52.0)
Hemoglobin: 11.8 g/dL — ABNORMAL LOW (ref 13.0–17.0)
Immature Granulocytes: 2 %
Lymphocytes Relative: 6 %
Lymphs Abs: 1 10*3/uL (ref 0.7–4.0)
MCH: 29.4 pg (ref 26.0–34.0)
MCHC: 33.3 g/dL (ref 30.0–36.0)
MCV: 88.1 fL (ref 80.0–100.0)
Monocytes Absolute: 1.2 10*3/uL — ABNORMAL HIGH (ref 0.1–1.0)
Monocytes Relative: 7 %
Neutro Abs: 13.5 10*3/uL — ABNORMAL HIGH (ref 1.7–7.7)
Neutrophils Relative %: 84 %
Platelets: 220 10*3/uL (ref 150–400)
RBC: 4.02 MIL/uL — ABNORMAL LOW (ref 4.22–5.81)
RDW: 14.7 % (ref 11.5–15.5)
WBC: 16.2 10*3/uL — ABNORMAL HIGH (ref 4.0–10.5)
nRBC: 0 % (ref 0.0–0.2)

## 2021-07-03 LAB — BASIC METABOLIC PANEL
Anion gap: 7 (ref 5–15)
BUN: 16 mg/dL (ref 8–23)
CO2: 18 mmol/L — ABNORMAL LOW (ref 22–32)
Calcium: 7.6 mg/dL — ABNORMAL LOW (ref 8.9–10.3)
Chloride: 114 mmol/L — ABNORMAL HIGH (ref 98–111)
Creatinine, Ser: 0.82 mg/dL (ref 0.61–1.24)
GFR, Estimated: >60 mL/min (ref >60)
Glucose, Bld: 102 mg/dL — ABNORMAL HIGH (ref 70–99)
Potassium: 3.4 mmol/L — ABNORMAL LOW (ref 3.5–5.1)
Sodium: 139 mmol/L (ref 135–145)

## 2021-07-03 MED ORDER — FAMOTIDINE 20 MG PO TABS
20.0000 mg | ORAL_TABLET | Freq: Every day | ORAL | Status: DC
  Administered 2021-07-04 – 2021-07-19 (×16): 20 mg via ORAL
  Filled 2021-07-03 (×16): qty 1

## 2021-07-03 MED ORDER — POTASSIUM CHLORIDE CRYS ER 20 MEQ PO TBCR: 40.0000 meq | EXTENDED_RELEASE_TABLET | Freq: Once | ORAL | Status: DC

## 2021-07-03 MED ORDER — POTASSIUM CHLORIDE 20 MEQ PO PACK
20.0000 meq | PACK | Freq: Two times a day (BID) | ORAL | Status: DC
  Administered 2021-07-03 – 2021-07-19 (×33): 20 meq via ORAL
  Filled 2021-07-03 (×32): qty 1

## 2021-07-03 MED ORDER — DIPHENHYDRAMINE HCL 12.5 MG/5ML PO ELIX: 12.5000 mg | ORAL_SOLUTION | Freq: Four times a day (QID) | ORAL | Status: DC | PRN

## 2021-07-03 NOTE — Progress Notes (Signed)
PROGRESS NOTE    Travis Calderon  GEX:[id number] DOB: Dec 10, 1956 DOA: 06/23/2021 PCP: [facility]     Brief Narrative: This 65 years old male with PMH significant for obesity, diverticulosis presented to the ED with complaints of abdominal pain and abnormal bowel movements.  Patient reports for the last several days he has had generalized abdominal pain associated with distention, nausea but denies any vomiting.  He also report over the past several months, he has had abnormal bowel movement,  he describes it as more frequent and does not appear same.  He regularly follows up with PCP,  does not take any take medications routinely. He [place]  He is found to be COVID-positive, CT abdomen shows large obstructing colonic mass.  Patient was started on pain medication. General surgery was consulted. Patient is transferred to [facility] and patient underwent Open sigmoid colectomy with end colostomy.  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 Colonic Mass/POA, ileus: CT abdomen pelvis showed large obstructing colonic mass. He reported having colonoscopy in [place],  he does not know the results. General surgery was consulted. Patient underwent open sigmoid colectomy with end colostomy on 06/24/21. Postoperative course complicated by ileus, recommended NG tube, IV hydration, pain control.. Continue colostomy care and  training.  COVID+: Incidental positive, chest x-ray unremarkable.  Remains asymptomatic.  Patient has briefly required oxygen post op but then was back to room air.  X-ray repeated on 06/29/2021 once again did not show any COVID pathology.   -on room air.  Acute kidney injury Likely prerenal: Serum creatinine up from 1.01> 2.43 > 2.82> 1.81> 2.09 > 1.57> 1.29> 1.15 likely prerenal postoperatively.  Now resolved.  CT abdomen with no hydronephrosis.  Continue normal saline.  Hypotension:  Blood pressure controlled.  Continue midodrine 5 mg 3 times daily.  Hypokalemia: Now resolved.  Morbid obesity:  recommend diet and weight loss.  Hypophosphatemia: Resolved.  DVT prophylaxis:  SCDs Code Status: Full code. Family Communication: none Disposition Plan:   Status is: Inpatient  Remains inpatient appropriate because:Inpatient level of care appropriate due to severity of illness  Dispo: The patient is from: Home              Anticipated d/c is to:  SNF,               Patient currently is not medically stable to d/c.   Difficult to place patient No   Consultants:  General surgery Nephrology  Procedures: Open sigmoid colectomy with end colostomy Antimicrobials:  Anti-infectives (From admission, onward)    Start     Dose/Rate Route Frequency Ordered Stop   06/24/21 2200  cefoTEtan (CEFOTAN) 2 g in sodium chloride 0.9 % 100 mL IVPB        2 g 200 mL/hr over 30 Minutes Intravenous Every 12 hours 06/24/21 1514 06/24/21 2109   06/24/21 0830  cefoTEtan (CEFOTAN) 2 g in sodium chloride 0.9 % 100  mL IVPB        2 g 200 mL/hr over 30 Minutes Intravenous On call to O.R. 06/24/21 0733 06/24/21 1117        Subjective: Feeling weak with few episodes of nausea  Objective: Vitals:   07/02/21 1229 07/02/21 2103 07/03/21 0431 07/03/21 1110  BP: 108/64 105/74 107/74 (!) 121/92  Pulse: 84 81 83 80  Resp: 18 20 16 18   Temp: 98.2 F (36.8 C) 99 F (37.2 C) (!) 97.3 F (36.3 C)    TempSrc: Oral     SpO2: 98% 97% 99% 99%  Weight:      Height:        Intake/Output Summary (Last 24 hours) at 07/03/2021 1238 Last data filed at 07/03/2021 0900 Gross per 24 hour  Intake 971.39 ml  Output 1870 ml  Net -898.61 ml   Filed Weights   06/30/21 0500 07/01/21 0559 07/02/21 0458  Weight: (!) 141 kg (!) 142.4 kg (!) 141.5 kg    Examination:   General: Appearance:    Severely obese male in no acute distress     Lungs:    respirations unlabored  Heart:    Normal heart rate.   MS:   All extremities are intact.    Neurologic:   Awake, alert, pleasant        Data Reviewed: I have personally reviewed following labs and imaging studies  CBC: Recent Labs  Lab 06/29/21 0319 06/30/21 0324 07/01/21 0237 07/02/21 0340 07/03/21 0332  WBC 11.6* 16.0* 19.9* 18.4* 16.2*  NEUTROABS 9.0* 12.9* 16.7* 15.4* 13.5*  HGB 12.6* 12.9* 13.6 12.5* 11.8*  HCT 36.9* 38.4* 41.8 37.0* 35.4*  MCV 85.8 87.3 91.3 86.9 88.1  PLT 229 220 217 229 220   Basic Metabolic Panel: Recent Labs  Lab 06/27/21 0302 06/28/21 0251 06/29/21 0319 06/29/21 1451 06/30/21 0324 06/30/21 1607 07/01/21 0237 07/02/21 0340 07/03/21 0332  NA 146* 144 144  --  141  --  141 140 139  K 2.7* 3.7 2.6*   < > 3.0* 3.4* 3.7 3.5 3.4*  CL 110 112* 110  --  110  --  115* 114* 114*  CO2 25 21* 26  --  21*  --  18* 19* 18*  GLUCOSE 118* 120* 118*  --  97  --  113* 108* 102*  BUN 53* 57* 51*  --  41*  --  34* 27* 16  CREATININE 1.81* 2.09* 1.57*  --  1.29*  --  1.15 0.89 0.82  CALCIUM 7.9* 8.1* 7.9*  --  7.9*  --  8.0* 7.8* 7.6*  MG 2.4 2.7*  --   --  2.7*  --   --   --   --   PHOS 2.5 2.3*  --   --  2.7  --   --   --   --    < > = values in this interval not displayed.   GFR: Estimated Creatinine Clearance: 122.3 mL/min (by C-G formula based on SCr of 0.82 mg/dL). Liver Function Tests: No results for input(s): AST, ALT, ALKPHOS, BILITOT, PROT, ALBUMIN in the last 168 hours.  No results for input(s):  LIPASE, AMYLASE in the last 168 hours.  No results for input(s): AMMONIA in the last 168 hours. Coagulation Profile: No results for input(s): INR, PROTIME in the last 168 hours.  Cardiac Enzymes: No results for input(s): CKTOTAL, CKMB, CKMBINDEX, TROPONINI in the last 168 hours. BNP (last 3 results) No results for input(s): PROBNP in  the last 8760 hours. HbA1C: No results for input(s): HGBA1C in the last 72 hours.  CBG: Recent Labs  Lab 06/29/21 2051  GLUCAP 103*   Lipid Profile: No results for input(s): CHOL, HDL, LDLCALC, TRIG, CHOLHDL, LDLDIRECT in the last 72 hours. Thyroid Function Tests: No results for input(s): TSH, T4TOTAL, FREET4, T3FREE, THYROIDAB in the last 72 hours. Anemia Panel: No results for input(s): VITAMINB12, FOLATE, FERRITIN, TIBC, IRON, RETICCTPCT in the last 72 hours. Sepsis Labs: No results for input(s): PROCALCITON, LATICACIDVEN in the last 168 hours.  Recent Results (from the past 240 hour(s))  Resp Panel by RT-PCR (Flu A&B, Covid) Nasopharyngeal Swab     Status: Abnormal   Collection Time: 06/23/21  1:12 PM   Specimen: Nasopharyngeal Swab; Nasopharyngeal(NP) swabs in vial transport medium  Result Value Ref Range Status   SARS Coronavirus 2 by RT PCR POSITIVE (A) NEGATIVE Final    Comment: RESULT CALLED TO, READ BACK BY AND VERIFIED WITH:  COBLE,S RN @1403  06/23/21 EDENSCA (NOTE) SARS-CoV-2 target nucleic acids are DETECTED.  The SARS-CoV-2 RNA is generally detectable in upper respiratory specimens during the acute phase of infection. Positive results are indicative of the presence of the identified virus, but do not rule out bacterial infection or co-infection with other pathogens not detected by the test. Clinical correlation with patient history and other diagnostic information is necessary to determine patient infection status. The expected result is Negative.  Fact Sheet for Patients: 08/24/21  Fact Sheet  for Healthcare Providers: BloggerCourse.com  This test is not yet approved or cleared by the SeriousBroker.it FDA and  has been authorized for detection and/or diagnosis of SARS-CoV-2 by FDA under an Emergency Use Authorization (EUA).  This EUA will remain in effect (meaning this test can be  used) for the duration of  the COVID-19 declaration under Section 564(b)(1) of the Act, 21 U.S.C. section 360bbb-3(b)(1), unless the authorization is terminated or revoked sooner.     Influenza A by PCR NEGATIVE NEGATIVE Final   Influenza B by PCR NEGATIVE NEGATIVE Final    Comment: (NOTE) The Xpert Xpress SARS-CoV-2/FLU/RSV plus assay is intended as an aid in the diagnosis of influenza from Nasopharyngeal swab specimens and should not be used as a sole basis for treatment. Nasal washings and aspirates are unacceptable for Xpert Xpress SARS-CoV-2/FLU/RSV testing.  Fact Sheet for Patients: [place]  Fact Sheet for Healthcare Providers: BloggerCourse.com  This test is not yet approved or cleared by the SeriousBroker.it FDA and has been authorized for detection and/or diagnosis of SARS-CoV-2 by FDA under an Emergency Use Authorization (EUA). This EUA will remain in effect (meaning this test can be used) for the duration of the COVID-19 declaration under Section 564(b)(1) of the Act, 21 U.S.C. section 360bbb-3(b)(1), unless the authorization is terminated or revoked.  Performed at [facility], [address] [place], [place]   Surgical PCR screen     Status: Abnormal   Collection Time: 06/24/21  1:09 AM   Specimen: Nasal Mucosa; Nasal Swab  Result Value Ref Range Status   MRSA, PCR POSITIVE (A) NEGATIVE Final    Comment: RESULT CALLED TO, READ BACK BY AND VERIFIED WITH: SHAW,C RN @0333  ON 06/24/21 JACKSON,K    Staphylococcus aureus POSITIVE (A) NEGATIVE Final    Comment: (NOTE) The Xpert SA  Assay (FDA approved for NASAL specimens in patients 27 years of age and older), is one component of a comprehensive surveillance program. It is not intended to diagnose  infection nor to guide or monitor treatment. Performed at [facility], [address], [place], [place] [address]   MRSA Next Gen by PCR, Nasal     Status: Abnormal   Collection Time: 06/24/21  3:38 PM   Specimen: Nasal Mucosa; Nasal Swab  Result Value Ref Range Status   MRSA by PCR Next Gen DETECTED (A) NOT DETECTED Final    Comment: RESULT CALLED TO, READ BACK BY AND VERIFIED WITH: YOUNTS,D. RN AT 1735 06/24/21 MULLINS,T (NOTE) The GeneXpert MRSA Assay (FDA approved for NASAL specimens only), is one component of a comprehensive MRSA colonization surveillance program. It is not intended to diagnose MRSA infection nor to guide or monitor treatment for MRSA infections. Test performance is not FDA approved in patients less than 66 years old. Performed at [facility], [address] [place], [place] [address]   C Difficile Quick Screen (NO PCR Reflex)     Status: None   Collection Time: 06/30/21 11:53 AM   Specimen: STOOL  Result Value Ref Range Status   C Diff antigen NEGATIVE NEGATIVE Final   C Diff toxin NEGATIVE NEGATIVE Final   C Diff interpretation No C. difficile detected.  Final    Comment: Performed at [facility], [address] [place], [place] [address]     Radiology Studies: CT ABDOMEN PELVIS WO CONTRAST  Result Date: 07/01/2021 CLINICAL DATA:  Colonic obstruction status post colostomy with Gertie Gowda pouch on 06/24/2021, increasing white blood cell count EXAM: CT ABDOMEN AND PELVIS WITHOUT CONTRAST TECHNIQUE: Multidetector CT imaging of the abdomen and pelvis was performed following the standard protocol without IV contrast. Unenhanced CT was performed per clinician order. Lack of IV contrast limits sensitivity and specificity, especially for  evaluation of abdominal/pelvic solid viscera. COMPARISON:  06/27/2021, 06/23/2021 FINDINGS: Lower chest: No acute pleural or parenchymal lung disease. Hepatobiliary: High attenuation material within the gallbladder may reflect gallbladder sludge or vicarious excretion of previously administered contrast. No evidence of cholelithiasis or cholecystitis. Unenhanced imaging of the liver is unremarkable. Pancreas: Unremarkable. No pancreatic ductal dilatation or surrounding inflammatory changes. Spleen: Normal in size without focal abnormality. Adrenals/Urinary Tract: No urinary tract calculi or obstructive uropathy within either kidney. There are small bilateral renal cortical cysts. The adrenals are unremarkable. The bladder is decompressed, limiting its evaluation. Stomach/Bowel: There has been interval resection of the obstructing sigmoid colon mass seen on prior CT, with diverting colostomy in the left lower quadrant. There are dilated gas-filled loops of small bowel within the left mid abdomen, measuring up to 3.9 cm in diameter with scattered gas fluid levels. Gas is seen throughout the bowel to the level of the colostomy, and findings are most consistent with postoperative ileus. There is significant distension of the stomach, and enteric catheter placement for gastric decompression could be considered. There is a small amount of gas adjacent to the staple line of the residual sigmoid colon, reference images 74 through 79 of series 2. There is no associated fluid collection or abscess identified on this limited unenhanced exam. Given recent surgical intervention, this is indeterminate, and could reflect postsurgical changes versus early sequela of suture line breakdown. Continued radiographic follow-up is recommended. Vascular/Lymphatic: No significant vascular findings are present. No enlarged abdominal or pelvic lymph nodes. The small pericolonic lymph node seen adjacent to the sigmoid colon mass on previous  study are not appreciated on this exam. Reproductive: Prostate is unremarkable. Other: Surgical drain is identified entering the abdomen in the right lower  quadrant and coiled within the lower pelvis near the site of sigmoid colon resection. Punctate foci of gas are seen within the anterior abdominal wall along the site of previous incision. No abdominal wall fluid collection or abscess. There is no free intraperitoneal fluid or free intraperitoneal gas. Mesenteric fat stranding is seen within the lower abdomen and pelvis at the site of prior colonic surgery. Musculoskeletal: No acute or destructive bony lesions. Severe right hip osteoarthritis again noted. Reconstructed images demonstrate no additional findings. IMPRESSION: 1. Interval resection of the obstructing sigmoid colon mass identified on prior CT, with left lower quadrant diverting colostomy as above. 2. Small amount of gas adjacent to the staple line at the site of sigmoid colon resection. Given recent surgical intervention, this could reflect residual postsurgical changes versus early suture line breakdown. No associated fluid collection or abscess identified on this unenhanced exam. Continued radiographic follow-up is recommended. 3. Likely postoperative ileus, with distended fluid-filled loops of small bowel and scattered gas fluid levels as above. Fluid-filled distended stomach is noted, an enteric catheter placement could be considered for gastric decompression. 4. attenuation material within the gallbladder, consistent with gallbladder sludge versus vicarious excretion of previously administered IV contrast. Electronically Signed   By: Sharlet SalinaMichael  Brown M.D.   On: 07/01/2021 16:48    Scheduled Meds:  chlorhexidine  15 mL Mouth Rinse BID   docusate sodium  100 mg Oral BID   heparin injection (subcutaneous)  5,000 Units Subcutaneous Q8H   lip balm  1 application Topical BID   mouth rinse  15 mL Mouth Rinse BID   melatonin  3 mg Oral QHS    potassium chloride  20 mEq Oral BID   psyllium  1 packet Oral BID   Continuous Infusions:  0.9 % NaCl with KCl 40 mEq / L 50 mL/hr at 07/02/21 2313   famotidine (PEPCID) IV 20 mg (07/03/21 1030)     LOS: 10 days    Time spent: 28 mins  Joseph ArtJessica U Lakara Weiland, DO [facility]   If 7PM-7AM, please contact night-coverage

## 2021-07-03 NOTE — Progress Notes (Signed)
Physical Therapy Treatment Patient Details Name: Travis Calderon MRN: [id number] DOB: 03/24/1956 Today's Date: 07/03/2021    History of Present Illness This 65 years old male with PMH significant for obesity, diverticulosis presented to the ED 06/23/21 with complaints of abdominal pain and abnormal bowel movements.He is found to be COVID-positive, CT abdomen shows large obstructing colonic mass.  Patient underwent Open sigmoid colectomy with end colostomy.on 06/24/21.Incisional wound VAC given massive distention, shock, contamination.  positive for covid    PT Comments    The patient ambulated in room x 25' using RW. Requires  min assistance  for bed mobility due to abdominal surgery and body habitus. Continues to  improve slowly.  Follow Up Recommendations  SNF     Equipment Recommendations  Rolling walker with 5" wheels    Recommendations for Other Services       Precautions / Restrictions Precautions Precautions: Fall Precaution Comments: colostomy,  right JP drain, VAC lower abd, urinary urgency, Restrictions Weight Bearing Restrictions: No    Mobility  Bed Mobility     Rolling: Min guard Sidelying to sit: Mod assist       General bed mobility comments: once turned , mod assist to sit  up from side , body habitus    Transfers Overall transfer level: Needs assistance   Transfers: Sit to/from Stand Sit to Stand: Min guard;From elevated surface            Ambulation/Gait Ambulation/Gait assistance: Min guard Gait Distance (Feet): 25 Feet Assistive device: Rolling walker (2 wheeled) Gait Pattern/deviations: Step-through pattern Gait velocity: decr   General Gait Details: manages  RW around Archivist    Modified Rankin (Stroke Patients Only)       Balance   Sitting-balance support: No upper extremity supported;Feet supported Sitting balance-Leahy Scale: Fair     Standing balance support: Bilateral  upper extremity supported;During functional activity Standing balance-Leahy Scale: Fair Standing balance comment: supported in standing, UE support                            Cognition Arousal/Alertness: Awake/alert Behavior During Therapy: WFL for tasks assessed/performed                                          Exercises      General Comments        Pertinent Vitals/Pain Pain Assessment: No/denies pain    Home Living                      Prior Function            PT Goals (current goals can now be found in the care plan section) Progress towards PT goals: Progressing toward goals    Frequency    Min 2X/week      PT Plan Current plan remains appropriate    Co-evaluation              AM-PAC PT "6 Clicks" Mobility   Outcome Measure  Help needed turning from your back to your side while in a flat bed without using bedrails?: A Little Help needed moving from lying on your back to sitting on the side of a flat bed without using bedrails?: A Little Help needed moving  to and from a bed to a chair (including a wheelchair)?: A Little Help needed standing up from a chair using your arms (e.g., wheelchair or bedside chair)?: A Little Help needed to walk in hospital room?: A Little Help needed climbing 3-5 steps with a railing? : A Lot 6 Click Score: 17    End of Session Equipment Utilized During Treatment: Gait belt Activity Tolerance: Patient tolerated treatment well Patient left: in chair;with call bell/phone within reach;with chair alarm set Nurse Communication: Mobility status PT Visit Diagnosis: Unsteadiness on feet (R26.81);Difficulty in walking, not elsewhere classified (R26.2)     Time: 1132-1202 PT Time Calculation (min) (ACUTE ONLY): 30 min  Charges:  $Gait Training: 8-22 mins $Therapeutic Activity: 8-22 mins                     Blanchard Kelch PT Acute Rehabilitation Services Pager [phone] Office  [phone]    Rada Hay 07/03/2021, 12:56 PM

## 2021-07-03 NOTE — Consult Note (Signed)
WOC Nurse Consult Note: Patient's sister, Burna Mortimer is in the room for today's visit.  She observes wound care and participates in the ostomy teaching session. She has good recollection of the previous session last week with M. Austin and today assists with placing the skin barrier ring around the stoma. She can repeat many of the steps pertaining to pouching.  I am assisted in today's care by the patient's bedside RN.  WOC Nurse wound follow up Wound type: Surgical. Routine NPWT dressing change Measurement:29cm x 6.2cm x 10cm Wound bed: Beefy red, moist Drainage (amount, consistency, odor) serosanguinous Periwound: intact, mild maceration from 2-6 o'clock.  Dressing loosening at most distal portion near subpannicular crease. Dressing procedure/placement/frequency: Dressing (including 2 piece of black foam) removed and wound cleansed with NS, then gently patted dry. One piece of skin barrier ring used to enhance seal from 5-7 o'clock. Three (3 total) pieces of black foam used to obliterate dead space, one (1) inserted deeply into distal portion of wound with 10cm depth and two (2) placed on top of that. Foam is covered with drape and attached to of continuous negative pressure and an immediate seal is achieved.  Patient tolerated procedure well.  WOC Nurse ostomy follow up Stoma type/location: LUQ colostomy Stomal assessment/size: 1 and 3/4 inch round, red, minimally budded, lumen at center. Small area of mucocutaneous separation at 5 o'clock where suture has pulled away from skin. I am unable to express any fluid from this area. Peristomal assessment: intact (see above) Treatment options for stomal/peristomal skin: skin barrier ring.  Patient's sister stretches to fit and places the skin barrier ring today. Output: thin, brown effluent.  With metamucil, patient's stool is "not as thin" according to bedside RN. Will monitor this and have taught patient and bedside RN that patient will need to be  changed to drainable pouch when stool thickens. Ostomy pouching: 2pc. 2 and 3/4 inch pouching system with skin barrier ring.  Pouch is [place] [phone], skin barrier is Hart [place] #2 and skin barrier ring is Hart [place] [phone]. Education provided: Patient's sister observed pouch removal using push/pull technique and rational is provided for this.  Stoma is cleansed with warm, damp washcloth and remains covered while skin barrier is being prepared. Stoma is resized and determined to be the same as last session.  Sister stretches skin barrier ring while this Clinical research associate cuts skin barrier.  Sister places skin barrier ring, then places skin barrier on top and removes tape borders and smoothes them down.  She requires assistance to affix the HOP to the skin barrier.  The drainable pouch in the room is not the correct size for the 2 and 3/4 inch barrier, but we use it to demonstrate pouch closure with the Lock and Roll feature.  Patient and his sister will require additional teaching on this pouch feature. Enrolled patient in [place] Secure Start Discharge program: Yes, previously.  Supplies provided to room:  Four 2-piece, 2 and 3/4 ostomy pouching system with skin barrier ring  WOC nursing team will follow along with you for continued NPWT dressing changes and ostomy teaching, and will remain available to this patient, the nursing and medical teams.   Thanks, Ladona Mow, MSN, RN, GNP, [place]  Pager# [phone]

## 2021-07-03 NOTE — Progress Notes (Signed)
PHARMACIST - PHYSICIAN COMMUNICATION    CONCERNING: IV to Oral Route Change Policy  RECOMMENDATION: This patient is receiving pepcid & prn benadryl by the intravenous route.  Based on criteria approved by the Pharmacy and Therapeutics Committee, the intravenous medication(s) is/are being converted to the equivalent oral dose form(s).   DESCRIPTION: These criteria include: The patient is eating (either orally or via tube) and/or has been taking other orally administered medications for a least 24 hours The patient has no evidence of active gastrointestinal bleeding or impaired GI absorption (gastrectomy, short bowel, patient on TNA or NPO).  If you have questions about this conversion, please contact the Pharmacy Department  []   [phone] )  ( [phone] []   [phone] )  [facility] []   [phone] )  [facility] []   [phone] )  [facility] [x]   [phone] )  [facility]   ( [phone] T, [facility] 07/03/2021 2:11 PM

## 2021-07-03 NOTE — Care Management Important Message (Signed)
Important Message  Patient Details IM Letter pl;aced in door caddy for Patient. Name: Travis Calderon MRN: [id number] Date of Birth: April 14, 1956   Medicare Important Message Given:  Yes     Caren Macadam 07/03/2021, 11:43 AM

## 2021-07-03 NOTE — Progress Notes (Addendum)
9 Days Post-Op  Subjective: CC: VAC changed this am by WOCN. Will plan to see Wed. Received colostomy teaching.  Reports improving abdominal pain. Most of pain is to the right of midline. Tolerating diet with some nausea but no emesis. Having colostomy output. Mobilizing with walker. Voiding.   Objective: Vital signs in last 24 hours: Temp:  [97.3 F (36.3 C)-99 F (37.2 C)] 97.3 F (36.3 C) (07/18 0431) Pulse Rate:  [81-84] 83 (07/18 0431) Resp:  [16-20] 16 (07/18 0431) BP: (105-108)/(64-74) 107/74 (07/18 0431) SpO2:  [97 %-99 %] 99 % (07/18 0431) Last BM Date: 07/02/21 (per ostomy)  Intake/Output from previous day: 07/17 0701 - 07/18 0700 In: 1291.4 [P.O.:720; I.V.:571.4] Out: 2200 [Urine:1500; Stool:700] Intake/Output this shift: Total I/O In: -  Out: 620 [Urine:600; Drains:20]  PE: Gen:  Alert, NAD, pleasant HEENT: EOM's intact, pupils equal and round Card:  RRR, no M/G/R heard Pulm:  CTAB, no W/R/R, effort normal Abd: Soft, mild distension, tenderness on the right side of abdomen without peritonitis. +BS. Midline wound with vac. Colostomy with stool in bag. Unable to visualize stoma 2/2 stool. JP drain SS Psych: A&Ox3  Skin: no rashes noted, warm and dry   Lab Results:  Recent Labs    07/02/21 0340 07/03/21 0332  WBC 18.4* 16.2*  HGB 12.5* 11.8*  HCT 37.0* 35.4*  PLT 229 220   BMET Recent Labs    07/02/21 0340 07/03/21 0332  NA 140 139  K 3.5 3.4*  CL 114* 114*  CO2 19* 18*  GLUCOSE 108* 102*  BUN 27* 16  CREATININE 0.89 0.82  CALCIUM 7.8* 7.6*   PT/INR No results for input(s): LABPROT, INR in the last 72 hours. CMP     Component Value Date/Time   NA 139 07/03/2021 0332   K 3.4 (L) 07/03/2021 0332   CL 114 (H) 07/03/2021 0332   CO2 18 (L) 07/03/2021 0332   GLUCOSE 102 (H) 07/03/2021 0332   BUN 16 07/03/2021 0332   CREATININE 0.82 07/03/2021 0332   CALCIUM 7.6 (L) 07/03/2021 0332   PROT 7.4 06/24/2021 0247   ALBUMIN 3.1 (L)  06/24/2021 0247   AST 12 (L) 06/24/2021 0247   ALT 11 06/24/2021 0247   ALKPHOS 69 06/24/2021 0247   BILITOT 1.3 (H) 06/24/2021 0247   GFRNONAA >60 07/03/2021 0332   Lipase     Component Value Date/Time   LIPASE 22 06/23/2021 1109       Studies/Results: CT ABDOMEN PELVIS WO CONTRAST  Result Date: 07/01/2021 CLINICAL DATA:  Colonic obstruction status post colostomy with Hartmann pouch on 06/24/2021, increasing white blood cell count EXAM: CT ABDOMEN AND PELVIS WITHOUT CONTRAST TECHNIQUE: Multidetector CT imaging of the abdomen and pelvis was performed following the standard protocol without IV contrast. Unenhanced CT was performed per clinician order. Lack of IV contrast limits sensitivity and specificity, especially for evaluation of abdominal/pelvic solid viscera. COMPARISON:  06/27/2021, 06/23/2021 FINDINGS: Lower chest: No acute pleural or parenchymal lung disease. Hepatobiliary: High attenuation material within the gallbladder may reflect gallbladder sludge or vicarious excretion of previously administered contrast. No evidence of cholelithiasis or cholecystitis. Unenhanced imaging of the liver is unremarkable. Pancreas: Unremarkable. No pancreatic ductal dilatation or surrounding inflammatory changes. Spleen: Normal in size without focal abnormality. Adrenals/Urinary Tract: No urinary tract calculi or obstructive uropathy within either kidney. There are small bilateral renal cortical cysts. The adrenals are unremarkable. The bladder is decompressed, limiting its evaluation. Stomach/Bowel: There has been interval resection of the obstructing  sigmoid colon mass seen on prior CT, with diverting colostomy in the left lower quadrant. There are dilated gas-filled loops of small bowel within the left mid abdomen, measuring up to 3.9 cm in diameter with scattered gas fluid levels. Gas is seen throughout the bowel to the level of the colostomy, and findings are most consistent with postoperative  ileus. There is significant distension of the stomach, and enteric catheter placement for gastric decompression could be considered. There is a small amount of gas adjacent to the staple line of the residual sigmoid colon, reference images 74 through 79 of series 2. There is no associated fluid collection or abscess identified on this limited unenhanced exam. Given recent surgical intervention, this is indeterminate, and could reflect postsurgical changes versus early sequela of suture line breakdown. Continued radiographic follow-up is recommended. Vascular/Lymphatic: No significant vascular findings are present. No enlarged abdominal or pelvic lymph nodes. The small pericolonic lymph node seen adjacent to the sigmoid colon mass on previous study are not appreciated on this exam. Reproductive: Prostate is unremarkable. Other: Surgical drain is identified entering the abdomen in the right lower quadrant and coiled within the lower pelvis near the site of sigmoid colon resection. Punctate foci of gas are seen within the anterior abdominal wall along the site of previous incision. No abdominal wall fluid collection or abscess. There is no free intraperitoneal fluid or free intraperitoneal gas. Mesenteric fat stranding is seen within the lower abdomen and pelvis at the site of prior colonic surgery. Musculoskeletal: No acute or destructive bony lesions. Severe right hip osteoarthritis again noted. Reconstructed images demonstrate no additional findings. IMPRESSION: 1. Interval resection of the obstructing sigmoid colon mass identified on prior CT, with left lower quadrant diverting colostomy as above. 2. Small amount of gas adjacent to the staple line at the site of sigmoid colon resection. Given recent surgical intervention, this could reflect residual postsurgical changes versus early suture line breakdown. No associated fluid collection or abscess identified on this unenhanced exam. Continued radiographic follow-up  is recommended. 3. Likely postoperative ileus, with distended fluid-filled loops of small bowel and scattered gas fluid levels as above. Fluid-filled distended stomach is noted, an enteric catheter placement could be considered for gastric decompression. 4. attenuation material within the gallbladder, consistent with gallbladder sludge versus vicarious excretion of previously administered IV contrast. Electronically Signed   By: Sharlet Salina M.D.   On: 07/01/2021 16:48    Anti-infectives: Anti-infectives (From admission, onward)    Start     Dose/Rate Route Frequency Ordered Stop   06/24/21 2200  cefoTEtan (CEFOTAN) 2 g in sodium chloride 0.9 % 100 mL IVPB        2 g 200 mL/hr over 30 Minutes Intravenous Every 12 hours 06/24/21 1514 06/24/21 2109   06/24/21 0830  cefoTEtan (CEFOTAN) 2 g in sodium chloride 0.9 % 100 mL IVPB        2 g 200 mL/hr over 30 Minutes Intravenous On call to O.R. 06/24/21 0733 06/24/21 1117        Assessment/Plan POD 9 s/p open sigmoid colectomy with end colostomy for obstructing sigmoid colon mass 06/24/21 Dr. Freida Busman - Pathology consistent with diverticulitis - CT 7/16 without abscess. There was a small amount of gas adjacent to staple line at the site of colon resection. Favored to be 2/2 recent surgical intervention rather than early suture line breakdown. He appears to be improving clinically. VS reassuring and stable. No tachycardia or hypotension. WBC down.  - WOC following for new  colostomy and VAC - Vac M/W/F. Will see vac change on Wed - Drain with SS fluid - continue for now - Monitor output - 700cc/24 hours. On metamucil.  - needs to mobilize, PT/OT - recommending SNF    FEN: carb mod diet, IVF per TRH VTE: SCDs, SQH ID: cefotetan x2 doses  COVID + - per TR[facility] Morbid Obesity - BMI 49.89 BPH - toelrated foley removal AKI - resolved     LOS: 10 days    Jacinto Halim , [facility] 07/03/2021, 10:01 AM Please see Amion for  pager number during day hours 7:00am-4:30pm

## 2021-07-04 DIAGNOSIS — K56609 Unspecified intestinal obstruction, unspecified as to partial versus complete obstruction: Secondary | ICD-10-CM | POA: Diagnosis not present

## 2021-07-04 LAB — BASIC METABOLIC PANEL
Anion gap: 5 (ref 5–15)
BUN: 14 mg/dL (ref 8–23)
CO2: 20 mmol/L — ABNORMAL LOW (ref 22–32)
Calcium: 7.9 mg/dL — ABNORMAL LOW (ref 8.9–10.3)
Chloride: 118 mmol/L — ABNORMAL HIGH (ref 98–111)
Creatinine, Ser: 0.84 mg/dL (ref 0.61–1.24)
GFR, Estimated: >60 mL/min (ref >60)
Glucose, Bld: 103 mg/dL — ABNORMAL HIGH (ref 70–99)
Potassium: 3.7 mmol/L (ref 3.5–5.1)
Sodium: 143 mmol/L (ref 135–145)

## 2021-07-04 LAB — CBC
HCT: 34.2 % — ABNORMAL LOW (ref 39.0–52.0)
Hemoglobin: 11.5 g/dL — ABNORMAL LOW (ref 13.0–17.0)
MCH: 29.3 pg (ref 26.0–34.0)
MCHC: 33.6 g/dL (ref 30.0–36.0)
MCV: 87 fL (ref 80.0–100.0)
Platelets: 210 10*3/uL (ref 150–400)
RBC: 3.93 MIL/uL — ABNORMAL LOW (ref 4.22–5.81)
RDW: 14.6 % (ref 11.5–15.5)
WBC: 16 10*3/uL — ABNORMAL HIGH (ref 4.0–10.5)
nRBC: 0 % (ref 0.0–0.2)

## 2021-07-04 NOTE — Progress Notes (Signed)
10 Days Post-Op  Subjective: CC: Doing well. Only having mild pain to the right of his midline incision, otherwise no abdominal pain. Denies n/v. Tolerating diet. Having ostomy output.   Objective: Vital signs in last 24 hours: Temp:  [98.1 F (36.7 C)-98.3 F (36.8 C)] 98.1 F (36.7 C) (07/19 0510) Pulse Rate:  [80-81] 80 (07/19 0510) Resp:  [16-20] 16 (07/19 0510) BP: (106-121)/(65-92) 106/71 (07/19 0510) SpO2:  [98 %-99 %] 98 % (07/19 0510) Last BM Date: 07/03/21  Intake/Output from previous day: 07/18 0701 - 07/19 0700 In: 480 [P.O.:480] Out: 2235 [Urine:2050; Drains:35; Stool:150] Intake/Output this shift: No intake/output data recorded.  PE: Gen:  Alert, NAD, pleasant HEENT: EOM's intact, pupils equal and round Card:  RRR, no M/G/R heard Pulm:  CTAB, no W/R/R, effort normal Abd: Soft, mild distension, tenderness on the right side of abdomen without peritonitis. +BS. Midline wound with vac. Colostomy with stool in bag. Unable to visualize stoma 2/2 stool. JP drain SS Psych: A&Ox3 Skin: no rashes noted, warm and dry  Lab Results:  Recent Labs    07/03/21 0332 07/04/21 0745  WBC 16.2* 16.0*  HGB 11.8* 11.5*  HCT 35.4* 34.2*  PLT 220 210   BMET Recent Labs    07/03/21 0332 07/04/21 0745  NA 139 143  K 3.4* 3.7  CL 114* 118*  CO2 18* 20*  GLUCOSE 102* 103*  BUN 16 14  CREATININE 0.82 0.84  CALCIUM 7.6* 7.9*   PT/INR No results for input(s): LABPROT, INR in the last 72 hours. CMP     Component Value Date/Time   NA 143 07/04/2021 0745   K 3.7 07/04/2021 0745   CL 118 (H) 07/04/2021 0745   CO2 20 (L) 07/04/2021 0745   GLUCOSE 103 (H) 07/04/2021 0745   BUN 14 07/04/2021 0745   CREATININE 0.84 07/04/2021 0745   CALCIUM 7.9 (L) 07/04/2021 0745   PROT 7.4 06/24/2021 0247   ALBUMIN 3.1 (L) 06/24/2021 0247   AST 12 (L) 06/24/2021 0247   ALT 11 06/24/2021 0247   ALKPHOS 69 06/24/2021 0247   BILITOT 1.3 (H) 06/24/2021 0247   GFRNONAA >60  07/04/2021 0745   Lipase     Component Value Date/Time   LIPASE 22 06/23/2021 1109       Studies/Results: No results found.  Anti-infectives: Anti-infectives (From admission, onward)    Start     Dose/Rate Route Frequency Ordered Stop   06/24/21 2200  cefoTEtan (CEFOTAN) 2 g in sodium chloride 0.9 % 100 mL IVPB        2 g 200 mL/hr over 30 Minutes Intravenous Every 12 hours 06/24/21 1514 06/24/21 2109   06/24/21 0830  cefoTEtan (CEFOTAN) 2 g in sodium chloride 0.9 % 100 mL IVPB        2 g 200 mL/hr over 30 Minutes Intravenous On call to O.R. 06/24/21 0733 06/24/21 1117        Assessment/Plan POD 10 s/p open sigmoid colectomy with end colostomy for obstructing sigmoid colon mass 06/24/21 Dr. Freida Busman - Pathology consistent with diverticulitis - CT 7/16 without abscess. There was a small amount of gas adjacent to staple line at the site of colon resection. Favored to be 2/2 recent surgical intervention rather than early suture line breakdown. He appears to be improving clinically. VS reassuring and stable. No tachycardia or hypotension. WBC stable at 16 - WOC following for new colostomy and VAC - Vac M/W/F. Will see vac change on Wed - Drain with  SS fluid - continue for now. Likely removal before discharge - Previously high output from colostomy. Improved. On metamucil.  - Mobilize, PT/OT - recommending SNF - Stable from our standpoint to start working towards SNF    FEN: carb mod diet, IVF per TRH VTE: SCDs, SQH ID: cefotetan x2 doses Foley - External    COVID + - per TRH Morbid Obesity - BMI 49.89 BPH  AKI - resolved    LOS: 11 days    Travis Calderon , [facility] Surgery 07/04/2021, 9:39 AM Please see Amion for pager number during day hours 7:00am-4:30pm

## 2021-07-04 NOTE — TOC Progression Note (Addendum)
Transition of Care [facility]) - Progression Note    Patient Details  Name: Thor Nannini MRN: [id number] Date of Birth: [birth year]  Transition of Care [facility]) CM/SW Contact  Darleene Cleaver, [place] Phone Number: 07/04/2021, 4:41 PM  Clinical Narrative:     Patient asked physician about CIR for rehab verse going to a SNF.  CSW contacted CIR and assigned PT to see if patient would be a good candidate for CIR.  CSW awaiting for response back.  CSW also sent updated clinical notes to SNFs in case patient not approved for CIR.  CSW to continue to follow patient's progress throughout discharge planning.        Expected Discharge Plan and Services  SNF verse CIR.                                     Social Determinants of Health (SDOH) Interventions    Readmission Risk Interventions No flowsheet data found.

## 2021-07-04 NOTE — Progress Notes (Signed)
PROGRESS NOTE    Travis Calderon  QPR:[id number] DOB: April 12, 1956 DOA: 06/23/2021 PCP: [facility]     Brief Narrative: This 65 years old male with PMH significant for obesity, diverticulosis presented to the ED with complaints of abdominal pain and abnormal bowel movements.  Patient reports for the last several days he has had generalized abdominal pain associated with distention, nausea but denies any vomiting.  He also report over the past several months, he has had abnormal bowel movement,  he describes it as more frequent and does not appear same.  He regularly follows up with PCP,  does not take any take medications routinely. [place]  He is found to be COVID-positive, CT abdomen shows large obstructing colonic mass.  Patient was started on pain medication. General surgery was consulted. Patient is transferred to [facility] and patient underwent Open sigmoid colectomy with end colostomy.  Complicated hospitalization course: including ATN.  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 Colonic Mass/POA, ileus: CT abdomen pelvis showed large obstructing colonic mass. He reported having colon[place] in 2019 [place],  he does not know the results. General surgery was consulted. Patient underwent open sigmoid colectomy with end colostomy on 06/24/21. Postoperative course complicated by ileus, recommended NG tube, need for IV hydration, pain  control.. Continue colostomy care and training.  COVID+: Incidental positive, chest x-ray unremarkable.  Remains asymptomatic.  Patient has briefly required oxygen post op but then was back to room air.  X-ray repeated on 06/29/2021 once again did not show any COVID pathology.   -on room air. -> 10 days from diagnosis- will d/c airborne  Acute kidney injury Likely prerenal: Serum creatinine up from 1.01> 2.43 > 2.82> 1.81> 2.09 > 1.57> 1.29> 1.15 likely prerenal postoperatively.  Now resolved.  CT abdomen with no hydronephrosis.  Continue normal saline.  Hypotension:  Blood pressure controlled.  Continue midodrine 5 mg 3 times daily.  Hypokalemia: Now resolved.  Morbid obesity:  recommend diet and weight loss.  Hypophosphatemia: Resolved.  DVT prophylaxis:  SCDs Code Status: Full code. Family Communication: sister on phone Disposition Plan:   Status is: Inpatient  Remains inpatient appropriate because:Inpatient level of care appropriate due to severity of illness  Dispo: The patient is from: Home              Anticipated d/c is to:  SNF vs CIR              Patient currently is not medically stable to d/c.   Difficult to place patient No   Consultants:  General surgery Nephrology  Procedures: Open sigmoid colectomy with end colostomy Antimicrobials:  Anti-infectives (From admission, onward)    Start     Dose/Rate Route Frequency Ordered Stop   06/24/21 2200  cefoTEtan (CEFOTAN) 2 g in sodium chloride 0.9 % 100 mL IVPB        2 g 200 mL/hr over 30 Minutes Intravenous Every 12 hours  06/24/21 1514 06/24/21 2109   06/24/21 0830  cefoTEtan (CEFOTAN) 2 g in sodium chloride 0.9 % 100 mL IVPB        2 g 200 mL/hr over 30 Minutes Intravenous On call to O.R. 06/24/21 0733 06/24/21 1117        Subjective: Asking about CIR for rehab and how long he needs to be on precautions   Objective: Vitals:   07/03/21 0431 07/03/21 1110 07/03/21 2028 07/04/21 0510  BP: 107/74 (!)  121/92 108/65 106/71  Pulse: 83 80 81 80  Resp: 16 18 20 16   Temp: (!) 97.3 F (36.3 C)  98.3 F (36.8 C) 98.1 F (36.7 C)  TempSrc:   Oral   SpO2: 99% 99% 99% 98%  Weight:      Height:        Intake/Output Summary (Last 24 hours) at 07/04/2021 1119 Last data filed at 07/04/2021 0700 Gross per 24 hour  Intake 240 ml  Output 1615 ml  Net -1375 ml   Filed Weights   06/30/21 0500 07/01/21 0559 07/02/21 0458  Weight: (!) 141 kg (!) 142.4 kg (!) 141.5 kg    Examination:   General: Appearance:    Severely obese male in no acute distress   Ostomy with good output, wound vac in place  Lungs:     respirations unlabored  Heart:    Normal heart rate.    MS:   All extremities are intact.    Neurologic:   Awake, alert, oriented x 3.           Data Reviewed: I have personally reviewed following labs and imaging studies  CBC: Recent Labs  Lab 06/29/21 0319 06/30/21 0324 07/01/21 0237 07/02/21 0340 07/03/21 0332 07/04/21 0745  WBC 11.6* 16.0* 19.9* 18.4* 16.2* 16.0*  NEUTROABS 9.0* 12.9* 16.7* 15.4* 13.5*  --   HGB 12.6* 12.9* 13.6 12.5* 11.8* 11.5*  HCT 36.9* 38.4* 41.8 37.0* 35.4* 34.2*  MCV 85.8 87.3 91.3 86.9 88.1 87.0  PLT 229 220 217 229 220 210   Basic Metabolic Panel: Recent Labs  Lab 06/28/21 0251 06/29/21 0319 06/30/21 0324 06/30/21 1607 07/01/21 0237 07/02/21 0340 07/03/21 0332 07/04/21 0745  NA 144   < > 141  --  141 140 139 143  K 3.7   < > 3.0* 3.4* 3.7 3.5 3.4* 3.7  CL 112*   < > 110  --  115* 114* 114* 118*  CO2 21*   < > 21*  --  18* 19* 18* 20*  GLUCOSE 120*   < > 97  --  113* 108* 102* 103*  BUN 57*   < > 41*  --  34* 27* 16 14  CREATININE 2.09*   < > 1.29*  --  1.15 0.89 0.82 0.84  CALCIUM 8.1*   < > 7.9*  --  8.0* 7.8* 7.6* 7.9*  MG 2.7*  --  2.7*  --   --   --   --   --   PHOS 2.3*  --  2.7  --   --   --   --   --    < > = values in this interval not displayed.   GFR: Estimated Creatinine Clearance: 119.4 mL/min (by C-G formula  based on SCr of 0.84 mg/dL). Liver Function Tests: No results for input(s): AST, ALT, ALKPHOS, BILITOT, PROT, ALBUMIN in the last 168 hours.  No results for input(s): LIPASE, AMYLASE in the last 168 hours.  No results for  input(s): AMMONIA in the last 168 hours. Coagulation Profile: No results for input(s): INR, PROTIME in the last 168 hours.  Cardiac Enzymes: No results for input(s): CKTOTAL, CKMB, CKMBINDEX, TROPONINI in the last 168 hours. BNP (last 3 results) No results for input(s): PROBNP in the last 8760 hours. HbA1C: No results for input(s): HGBA1C in the last 72 hours.  CBG: Recent Labs  Lab 06/29/21 2051  GLUCAP 103*   Lipid Profile: No results for input(s): CHOL, HDL, LDLCALC, TRIG, CHOLHDL, LDLDIRECT in the last 72 hours. Thyroid Function Tests: No results for input(s): TSH, T4TOTAL, FREET4, T3FREE, THYROIDAB in the last 72 hours. Anemia Panel: No results for input(s): VITAMINB12, FOLATE, FERRITIN, TIBC, IRON, RETICCTPCT in the last 72 hours. Sepsis Labs: No results for input(s): PROCALCITON, LATICACIDVEN in the last 168 hours.  Recent Results (from the past 240 hour(s))  MRSA Next Gen by PCR, Nasal     Status: Abnormal   Collection Time: 06/24/21  3:38 PM   Specimen: Nasal Mucosa; Nasal Swab  Result Value Ref Range Status   MRSA by PCR Next Gen DETECTED (A) NOT DETECTED Final    Comment: RESULT CALLED TO, READ BACK BY AND VERIFIED WITH: YOUNTS,D. RN AT 1735 06/24/21 MULLINS,T (NOTE) The GeneXpert MRSA Assay (FDA approved for NASAL specimens only), is one component of a comprehensive MRSA colonization surveillance program. It is not intended to diagnose MRSA infection nor to guide or monitor treatment for MRSA infections. Test performance is not FDA approved in patients less than 59 years old. Performed at [facility], [address] [place], [place] [address]   C Difficile Quick Screen (NO PCR Reflex)     Status: None   Collection  Time: 06/30/21 11:53 AM   Specimen: STOOL  Result Value Ref Range Status   C Diff antigen NEGATIVE NEGATIVE Final   C Diff toxin NEGATIVE NEGATIVE Final   C Diff interpretation No C. difficile detected.  Final    Comment: Performed at [facility], [address] [place], [place] [address]     Radiology Studies: No results found.  Scheduled Meds:  chlorhexidine  15 mL Mouth Rinse BID   docusate sodium  100 mg Oral BID   famotidine  20 mg Oral Daily   heparin injection (subcutaneous)  5,000 Units Subcutaneous Q8H   lip balm  1 application Topical BID   mouth rinse  15 mL Mouth Rinse BID   melatonin  3 mg Oral QHS   potassium chloride  20 mEq Oral BID   psyllium  1 packet Oral BID   Continuous Infusions:  0.9 % NaCl with KCl 40 mEq / L 50 mL/hr at 07/02/21 2313     LOS: 11 days    Time spent: 28 mins  Joseph Art, DO [facility]   If 7PM-7AM, please contact night-coverage

## 2021-07-05 ENCOUNTER — Inpatient Hospital Stay (HOSPITAL_COMMUNITY): Payer: Medicare Other

## 2021-07-05 DIAGNOSIS — K56609 Unspecified intestinal obstruction, unspecified as to partial versus complete obstruction: Secondary | ICD-10-CM | POA: Diagnosis not present

## 2021-07-05 LAB — CBC
HCT: 32.8 % — ABNORMAL LOW (ref 39.0–52.0)
Hemoglobin: 10.8 g/dL — ABNORMAL LOW (ref 13.0–17.0)
MCH: 29.3 pg (ref 26.0–34.0)
MCHC: 32.9 g/dL (ref 30.0–36.0)
MCV: 89.1 fL (ref 80.0–100.0)
Platelets: 249 10*3/uL (ref 150–400)
RBC: 3.68 MIL/uL — ABNORMAL LOW (ref 4.22–5.81)
RDW: 14.8 % (ref 11.5–15.5)
WBC: 15.3 10*3/uL — ABNORMAL HIGH (ref 4.0–10.5)
nRBC: 0 % (ref 0.0–0.2)

## 2021-07-05 LAB — BASIC METABOLIC PANEL
Anion gap: 5 (ref 5–15)
BUN: 10 mg/dL (ref 8–23)
CO2: 21 mmol/L — ABNORMAL LOW (ref 22–32)
Calcium: 7.7 mg/dL — ABNORMAL LOW (ref 8.9–10.3)
Chloride: 117 mmol/L — ABNORMAL HIGH (ref 98–111)
Creatinine, Ser: 0.8 mg/dL (ref 0.61–1.24)
GFR, Estimated: >60 mL/min (ref >60)
Glucose, Bld: 98 mg/dL (ref 70–99)
Potassium: 3.9 mmol/L (ref 3.5–5.1)
Sodium: 143 mmol/L (ref 135–145)

## 2021-07-05 MED ORDER — IOHEXOL 9 MG/ML PO SOLN
500.0000 mL | ORAL | Status: AC
  Administered 2021-07-05: 500 mL via ORAL

## 2021-07-05 MED ORDER — IOHEXOL 350 MG/ML SOLN
80.0000 mL | Freq: Once | INTRAVENOUS | Status: AC | PRN
  Administered 2021-07-05: 80 mL via INTRAVENOUS

## 2021-07-05 MED ORDER — IOHEXOL 9 MG/ML PO SOLN
ORAL | Status: AC
  Administered 2021-07-05: 500 mL via ORAL
  Filled 2021-07-05: qty 1000

## 2021-07-05 NOTE — Progress Notes (Signed)
PROGRESS NOTE    Travis Calderon  NLG:[id number] DOB: 16-Dec-1956 DOA: 06/23/2021 PCP: [facility]    Brief Narrative:  Patient is 65 year old gentleman with history of obesity, diverticulosis presented to the emergency room with complaints of abdominal pain and abnormal bowel movements.  He was having symptoms for last few days with distention and nausea.  Patient also complained of chronic diarrhea.  In the emergency room he was found to be positive for COVID-19, CT scan abdomen pelvis showed large obstructing colonic mass.  Admitted to hospital and underwent colectomy with colostomy.  Hospital course complicated with ATN.  Stabilizing now.   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  Treated with IV antibiotics and fluid. Status post sigmoid colectomy and end colostomy on 7/9.  Postoperative course complicated by ileus and now resolved. Followed by surgery, persistent elevated white blood cell count, repeat CT scan planned today as per surgery. Currently tolerating regular diet.  Advance activities. Pathology consistent with diverticulitis, no evidence of malignancy.  COVID-19 viral infection: Mostly asymptomatic.  No evidence of pneumonia.  As per symptoms improved.  Symptomatic treatment.  Discontinue isolation precautions.  Acute kidney injury: Prerenal injury.  Now with improved appetite.  Renal functions normalized. Remains on maintenance IV fluids with  potassium.  Continue today.  Electrolyte abnormalities: Mostly improved.  Continue to work with PT OT.  Discharge to rehab if cleared by surgery.   DVT prophylaxis: heparin injection 5,000 Units Start: 06/26/21 0900 SCDs Start: 06/23/21 1845   Code Status: Full code Family Communication: Sister at the bedside Disposition Plan: Status is: Inpatient  Remains inpatient appropriate because:Unsafe d/c plan  Dispo: The patient is from: Home              Anticipated d/c is to: CIR              Patient currently is medically stable to d/c.   Difficult to place patient No         Consultants:  General surgery  Procedures:  Hartmann procedure 7/9  Antimicrobials:  Completed antibiotics.   Subjective: Patient seen and examined.  Sister was at the bedside.  Denies any nausea or vomiting.  He was able to eat some meals.  Remains afebrile.  Minimal or no abdominal pain. Wound examined by surgery team, reportedly wound VAC removed with some purulence.  Colostomy functioning well.  Objective: Vitals:   07/04/21 0510 07/04/21 1251 07/04/21 2017 07/05/21 0503  BP: 106/71 96/70 110/67 104/62  Pulse: 80 75 86 79  Resp: 16  16 20   Temp: 98.1 F (36.7 C) 98.1 F (36.7 C) 100 F (37.8 C) 98.6 F (37 C)  TempSrc:  Oral Oral Oral  SpO2: 98% 96% 100% 97%  Weight:      Height:        Intake/Output Summary (Last 24 hours) at 07/05/2021 1130 Last data filed at 07/05/2021 0855 Gross per 24 hour  Intake 2863.01 ml  Output 2265 ml  Net 598.01 ml   07/07/2021  06/30/21 0500 07/01/21 0559 07/02/21 0458  Weight: (!) 141 kg (!) 142.4 kg (!) 141.5 kg    Examination:  General exam: Appears calm and comfortable  Respiratory system: Clear to auscultation. Respiratory effort normal.  No added sounds. Cardiovascular system: S1 & S2 heard, RRR.  No edema.   Gastrointestinal system: Abdomen is large and pendulous.  Nontender. Midline surgical incision inspected by surgery, pictures  in media section. Colostomy with loose stool, looks functioning well.  Bowel sounds present. Central nervous system: Alert and oriented. No focal neurological deficits. Extremities: Symmetric 5 x 5 power. Skin: No rashes, lesions or ulcers Psychiatry: Judgement and insight appear normal. Mood & affect appropriate.     Data Reviewed: I have personally reviewed following labs and imaging studies  CBC: Recent Labs  Lab 06/29/21 0319 06/30/21 0324 07/01/21 0237 07/02/21 0340 07/03/21 0332 07/04/21 0745 07/05/21 0849  WBC 11.6* 16.0* 19.9* 18.4* 16.2* 16.0* 15.3*  NEUTROABS 9.0* 12.9* 16.7* 15.4* 13.5*  --   --   HGB 12.6* 12.9* 13.6 12.5* 11.8* 11.5* 10.8*  HCT 36.9* 38.4* 41.8 37.0* 35.4* 34.2* 32.8*  MCV 85.8 87.3 91.3 86.9 88.1 87.0 89.1  PLT 229 220 217 229 220 210 249   Basic Metabolic Panel: Recent Labs  Lab 06/30/21 0324 06/30/21 1607 07/01/21 0237 07/02/21 0340 07/03/21 0332 07/04/21 0745 07/05/21 0849  NA 141  --  141 140 139 143 143  K 3.0*   < > 3.7 3.5 3.4* 3.7 3.9  CL 110  --  115* 114* 114* 118* 117*  CO2 21*  --  18* 19* 18* 20* 21*  GLUCOSE 97  --  113* 108* 102* 103* 98  BUN 41*  --  34* 27* 16 14 10   CREATININE 1.29*  --  1.15 0.89 0.82 0.84 0.80  CALCIUM 7.9*  --  8.0* 7.8* 7.6* 7.9* 7.7*  MG 2.7*  --   --   --   --   --   --   PHOS 2.7  --   --   --   --   --   --    < > = values in this interval not displayed.   GFR: Estimated Creatinine Clearance: 125.4 mL/min (by C-G formula based on SCr of 0.8 mg/dL). Liver Function Tests: No results for input(s): AST, ALT, ALKPHOS, BILITOT, PROT, ALBUMIN in the last 168 hours. No results for input(s): LIPASE, AMYLASE in the last 168 hours. No results for input(s): AMMONIA in the last 168 hours. Coagulation Profile: No results for input(s): INR, PROTIME in the last 168 hours. Cardiac Enzymes: No results for input(s): CKTOTAL, CKMB, CKMBINDEX, TROPONINI in the last 168 hours. BNP (last 3 results) No  results for input(s): PROBNP in the last 8760 hours. HbA1C: No results for input(s): HGBA1C in the last 72 hours. CBG: Recent Labs  Lab 06/29/21 2051  GLUCAP 103*   Lipid Profile: No results for input(s): CHOL, HDL, LDLCALC, TRIG, CHOLHDL, LDLDIRECT in the last 72 hours. Thyroid Function Tests: No results for input(s): TSH, T4TOTAL, FREET4, T3FREE, THYROIDAB in the last 72 hours. Anemia Panel: No results for input(s): VITAMINB12, FOLATE, FERRITIN, TIBC, IRON, RETICCTPCT in the last 72 hours. Sepsis Labs: No results for input(s): PROCALCITON, LATICACIDVEN in the last 168 hours.  Recent Results (from the past 240 hour(s))  C Difficile Quick Screen (NO PCR Reflex)     Status: None   Collection Time: 06/30/21 11:53 AM   Specimen: STOOL  Result Value Ref Range Status  C Diff antigen NEGATIVE NEGATIVE Final   C Diff toxin NEGATIVE NEGATIVE Final   C Diff interpretation No C. difficile detected.  Final    Comment: Performed at [facility], [address] [place], [place] [address]         Radiology Studies: No results found.      Scheduled Meds:  chlorhexidine  15 mL Mouth Rinse BID   famotidine  20 mg Oral Daily   heparin injection (subcutaneous)  5,000 Units Subcutaneous Q8H   lip balm  1 application Topical BID   mouth rinse  15 mL Mouth Rinse BID   melatonin  3 mg Oral QHS   potassium chloride  20 mEq Oral BID   psyllium  1 packet Oral BID   Continuous Infusions:  0.9 % NaCl with KCl 40 mEq / L 50 mL/hr at 07/05/21 0346     LOS: 12 days    Time spent: 32 minutes     Dorcas Carrow, MD [facility] Pager [phone]

## 2021-07-05 NOTE — Consult Note (Addendum)
WOC Nurse Wound follow-up Consult Note:  Surgical team following for assessment and plan of care for abd wound; PA at bedside to assess wound appearance. Pt declined offer of pain meds prior to dressing change and tolerated with minimal amt discomfort. Removed 3 pieces black sponge.  Strong foul odor from wound bed.  Mod amt thick tan drainage in the Vac cannister.  Full thickness midline abd post-op wound 29X6X10 cm, with tunneling areas when probed with a swab as follows: 12:00 o'clock: 5 cm 5:00 o'clock: 6 cm 7:00 o'clock: 4 cm Wound is 60% beefy red, 40% yellow slough with retention sutures visible. PA discussed plan of care with pt and sister at the bedside and Vac was discontinued.  Moist gauze packing and ABD pad and tape applied and topical treatment orders provided for bedside nurses to perform.     WOC Nurse ostomy follow up Stoma type/location: LUQ colostomy Strong foul odor when previous pouch was removed.  Pt had previously been wearing a high output pouch related to large volume of stool but this has thickened and now I will switch to a regular 2 piece pouching system. [facility] [place] # 2, pouch Lawson # [id number]) Stoma is red and viable, above skin level, 1 3/4 inches.  There is a narrow opening to the suture line which is draining mod abd thick tan pus.  5 cm depth when probed with a swab.  Too narrow for packing to be applied to absorb the drainage. Surgical PA assessed the appearance and discussed plan of care with sister and Pt.   Applied barrier ring and 2 piece pouching system.   Education provided: Patient's sister observed pouch removal using push/pull technique and rational is provided for this.  Stoma is cleansed with warm, damp washcloth and remains covered while skin barrier is being prepared. Stoma is resized and determined to be the same as last session. Sister assisted with the process and appears to have a good understanding of pouch application and emptying related to previous  educational sessions.   The patient and his sister were able to open and close velcro to empty. Discussed that pouch may not be able to maintain an adequate seal for more than a day related to the pus draining at the peristomal site. Educated to call for assistance with emptying by the staff nurses when the pouch is half full to avoid leakage, now that he is not in a high output pouch.  Reviewed pouching routines.  3 extra sets of supplies left in the room for staff nurse use.  Enrolled patient in [place] Secure Start Discharge program: Yes, previously. WOC team will continue to follow. Thank-you,  Cammie Mcgee MSN, RN, CWOCN, [place], CNS [phone]

## 2021-07-05 NOTE — Progress Notes (Signed)
11 Days Post-Op  Subjective: CC: Some right sided abdominal pain. Tolerating diet without n/v. Ostomy functioning. Seen with wocn for vac and ostomy bag change. Documented 550/24 hours from ostomy.   Objective: Vital signs in last 24 hours: Temp:  [98.1 F (36.7 C)-100 F (37.8 C)] 98.6 F (37 C) (07/20 0503) Pulse Rate:  [75-86] 79 (07/20 0503) Resp:  [16-20] 20 (07/20 0503) BP: (96-110)/(62-70) 104/62 (07/20 0503) SpO2:  [96 %-100 %] 97 % (07/20 0503) Last BM Date: 07/04/21  Intake/Output from previous day: 07/19 0701 - 07/20 0700 In: 3103 [P.O.:240; I.V.:2863] Out: 1740 [Urine:1175; Drains:15; Stool:550] Intake/Output this shift: No intake/output data recorded.  PE: Gen:  Alert, NAD, pleasant Pulm: normal rate and effort  Abd: Soft, mild distension, tenderness on the right side of abdomen without peritonitis. +BS. Midline wound with vac. Output from vac brown/white. Vac was removed with wocn. See below. Midline wound with beefy red granulation tissue. Wound with dehiscence without evisceration. There is 5cm of tracking inferior and 5cm of tracking superior as noted in 2nd and 3rd pictures respectively. No drainage. Colostomy with semisolid/liquid stool in bag. Stoma pink and budded above skin level. There is purulent like drainage that is able to be expressed adjacent to the stoma at the 5 o'clock position as noted in the 5th picture below. No surrounding cellulitis. I do not see/feel any erythema, heat, induration, fluctuance adjacent to this. JP drain SS with some cloudy sediment in bulb from drain being stripped Psych: A&Ox3 Skin: no rashes noted, warm and dry             Lab Results:  Recent Labs    07/03/21 0332 07/04/21 0745  WBC 16.2* 16.0*  HGB 11.8* 11.5*  HCT 35.4* 34.2*  PLT 220 210   BMET Recent Labs    07/03/21 0332 07/04/21 0745  NA 139 143  K 3.4* 3.7  CL 114* 118*  CO2 18* 20*  GLUCOSE 102* 103*  BUN 16 14  CREATININE 0.82 0.84   CALCIUM 7.6* 7.9*   PT/INR No results for input(s): LABPROT, INR in the last 72 hours. CMP     Component Value Date/Time   NA 143 07/04/2021 0745   K 3.7 07/04/2021 0745   CL 118 (H) 07/04/2021 0745   CO2 20 (L) 07/04/2021 0745   GLUCOSE 103 (H) 07/04/2021 0745   BUN 14 07/04/2021 0745   CREATININE 0.84 07/04/2021 0745   CALCIUM 7.9 (L) 07/04/2021 0745   PROT 7.4 06/24/2021 0247   ALBUMIN 3.1 (L) 06/24/2021 0247   AST 12 (L) 06/24/2021 0247   ALT 11 06/24/2021 0247   ALKPHOS 69 06/24/2021 0247   BILITOT 1.3 (H) 06/24/2021 0247   GFRNONAA >60 07/04/2021 0745   Lipase     Component Value Date/Time   LIPASE 22 06/23/2021 1109       Studies/Results: No results found.  Anti-infectives: Anti-infectives (From admission, onward)    Start     Dose/Rate Route Frequency Ordered Stop   06/24/21 2200  cefoTEtan (CEFOTAN) 2 g in sodium chloride 0.9 % 100 mL IVPB        2 g 200 mL/hr over 30 Minutes Intravenous Every 12 hours 06/24/21 1514 06/24/21 2109   06/24/21 0830  cefoTEtan (CEFOTAN) 2 g in sodium chloride 0.9 % 100 mL IVPB        2 g 200 mL/hr over 30 Minutes Intravenous On call to O.R. 06/24/21 8416 06/24/21 1117  Assessment/Plan POD 11 s/p open sigmoid colectomy with end colostomy for obstructing sigmoid colon mass 06/24/21 Dr. Freida Busman - Pathology consistent with diverticulitis - CT 7/16 without abscess. There was a small amount of gas adjacent to staple line at the site of colon resection. Favored to be 2/2 recent surgical intervention rather than early suture line breakdown. Repeat labs today.  - Noted purulent drainage around the stoma - Drain with SS mixed with some cloudy sediment fluid today  - Repeat labs. Discuss with MD about repeat CT - D/c VAC. BID WTD - Previously high output from colostomy. On metamucil. Monitor - Mobilize, PT/OT - recommending SNF    FEN: carb mod diet, IVF per TR[facility] VTE: SCDs, SQH ID: cefotetan x2 doses Foley - External     COVID + - per TRH Morbid Obesity - BMI 49.89 BPH  AKI - resolved    LOS: 12 days    Jacinto Halim , [facility] Surgery 07/05/2021, 8:26 AM Please see Amion for pager number during day hours 7:00am-4:30pm

## 2021-07-05 NOTE — Progress Notes (Signed)
Inpatient Rehab Admissions Coordinator:   Per MD request pt was screened for CIR appropriateness.  Pt could potentially be a candidate; however I would not be able to get [facility] approval with SNF recommendations from both PT and OT.  Also note that pt was mobilizing with fairly little assistance on last PT and OT session, so may be too high functioning for CIR.  Would recommend reassessment by PT/OT to evaluate for most current mobility status and will rescreen at that time.   Estill Dooms, PT, DPT Admissions Coordinator [phone] 07/05/21  9:01 AM

## 2021-07-05 NOTE — Progress Notes (Signed)
Occupational Therapy Treatment Patient Details Name: Travis Calderon MRN: [id number] DOB: 21-Aug-1956 Today's Date: 07/05/2021    History of present illness This 65 years old male with PMH significant for obesity, diverticulosis presented to the ED 06/23/21 with complaints of abdominal pain and abnormal bowel movements.He is found to be COVID-positive, CT abdomen shows large obstructing colonic mass.  Patient underwent Open sigmoid colectomy with end colostomy.on 06/24/21.Incisional wound VAC given massive distention, shock, contamination.  positive for covid   OT comments  Patient motivated to get out of bed, does need increased time for all mobility due to abdominal soreness and increased labored breathing. Attempted to have patient log roll out of bed however difficulty bending R knee ultimately sliding legs to edge of bed and min A to elevate trunk. Patient min G with rolling walker to ambulate to bathroom for oral care, washing face. Patient relies heavily on support of sink and is fatigued after ~3 mins standing needing to sit onto elevated toilet before ambulating back to bed. Patient motivated to return to independence, with sister whom he lives with very encouraging of patient progress. Update recommendations for CIR consult, acute OT to follow.   Follow Up Recommendations  CIR    Equipment Recommendations  Other (comment) (bariatric bedside commode)    Recommendations for Other Services Rehab consult    Precautions / Restrictions Precautions Precautions: Fall Precaution Comments: colostomy,  right JP drain, urinary urgency,       Mobility Bed Mobility Overal bed mobility: Needs Assistance Bed Mobility: Supine to Sit;Sit to Supine     Supine to sit: Min assist;HOB elevated Sit to supine: Mod assist   General bed mobility comments: attempted to educate patient in log roll however difficulty bending R knee and does not try rolling instead slides legs to edge of bed and uses bed  rail to sit upright with min A. mod A for legs back to bed    Transfers Overall transfer level: Needs assistance Equipment used: Rolling walker (2 wheeled) Transfers: Sit to/from Stand Sit to Stand: Min guard;From elevated surface         General transfer comment: patient needs increased time and min cues for hand placement to stand from elevated bed height, then elevated toilet.    Balance Overall balance assessment: Needs assistance Sitting-balance support: Feet supported Sitting balance-Leahy Scale: Fair     Standing balance support: Single extremity supported;Bilateral upper extremity supported Standing balance-Leahy Scale: Poor Standing balance comment: UE support on sink in standing                           ADL either performed or assessed with clinical judgement   ADL Overall ADL's : Needs assistance/impaired     Grooming: Wash/dry face;Wash/dry hands;Oral care;Supervision/safety;Standing Grooming Details (indicate cue type and reason): leans onto sink for support, reports back pain/stiffness. labored breathing with standing ADL tasks             Lower Body Dressing: Total assistance;Bed level Lower Body Dressing Details (indicate cue type and reason): to don socks, would benefit from [facility] AE due to body habitus and abdominal wounds Toilet Transfer: Min guard;RW;Ambulation;Comfort height toilet Toilet Transfer Details (indicate cue type and reason): patient needing seated rest break on toilet after standing g/h tasks before walking back to bed         Functional mobility during ADLs: Min guard;Rolling walker General ADL Comments: patient progressing well however still with decreased activity tolerance compared  to baseline and labored breathing with simple standing ADL tasks.      Cognition Arousal/Alertness: Awake/alert Behavior During Therapy: WFL for tasks assessed/performed Overall Cognitive Status: Within Functional Limits for tasks  assessed                                                     Pertinent Vitals/ Pain       Pain Assessment: Faces Faces Pain Scale: Hurts little more Pain Location: back, abdomen Pain Descriptors / Indicators: Aching;Grimacing Pain Intervention(s): Monitored during session         Frequency  Min 2X/week        Progress Toward Goals  OT Goals(current goals can now be found in the care plan section)  Progress towards OT goals: Progressing toward goals  Acute Rehab OT Goals Patient Stated Goal: return to independence, CIR for rehab OT Goal Formulation: With patient/family Time For Goal Achievement: 07/12/21 Potential to Achieve Goals: Good ADL Goals Pt Will Perform Lower Body Dressing: with supervision;with adaptive equipment;sit to/from stand Pt Will Transfer to Toilet: with supervision;regular height toilet Pt Will Perform Toileting - Clothing Manipulation and hygiene: with supervision  Plan Discharge plan needs to be updated       AM-PAC OT "6 Clicks" Daily Activity     Outcome Measure   Help from another person eating meals?: None Help from another person taking care of personal grooming?: A Little Help from another person toileting, which includes using toliet, bedpan, or urinal?: A Lot Help from another person bathing (including washing, rinsing, drying)?: A Lot Help from another person to put on and taking off regular upper body clothing?: A Little Help from another person to put on and taking off regular lower body clothing?: Total 6 Click Score: 15    End of Session Equipment Utilized During Treatment: Rolling walker  OT Visit Diagnosis: Other abnormalities of gait and mobility (R26.89);Pain Pain - part of body:  (abdomen, back)   Activity Tolerance Patient tolerated treatment well   Patient Left in bed;with call bell/phone within reach;with family/visitor present   Nurse Communication Mobility status        Time: 7829-5621 OT  Time Calculation (min): 29 min  Charges: OT General Charges $OT Visit: 1 Visit OT Treatments $Self Care/Home Management : 23-37 mins  Marlyce Huge OT OT pager: [phone]   Carmelia Roller 07/05/2021, 1:31 PM

## 2021-07-05 NOTE — Progress Notes (Signed)
PT Cancellation Note  Patient Details Name: Travis Calderon MRN: [id number] DOB: 1956/10/29   Cancelled Treatment:    Reason Eval/Treat Not Completed: Patient  prepping for a  procedure or test/unavailable. Will check back tomorrow.   Travis Calderon 07/05/2021, 3:13 PM  Blanchard Kelch PT Acute Rehabilitation Services Pager [phone] Office [phone]

## 2021-07-06 DIAGNOSIS — K56609 Unspecified intestinal obstruction, unspecified as to partial versus complete obstruction: Secondary | ICD-10-CM | POA: Diagnosis not present

## 2021-07-06 LAB — BASIC METABOLIC PANEL
Anion gap: 8 (ref 5–15)
BUN: 9 mg/dL (ref 8–23)
CO2: 19 mmol/L — ABNORMAL LOW (ref 22–32)
Calcium: 7.8 mg/dL — ABNORMAL LOW (ref 8.9–10.3)
Chloride: 115 mmol/L — ABNORMAL HIGH (ref 98–111)
Creatinine, Ser: 0.79 mg/dL (ref 0.61–1.24)
GFR, Estimated: >60 mL/min (ref >60)
Glucose, Bld: 91 mg/dL (ref 70–99)
Potassium: 3.9 mmol/L (ref 3.5–5.1)
Sodium: 142 mmol/L (ref 135–145)

## 2021-07-06 MED ORDER — PIPERACILLIN-TAZOBACTAM 3.375 G IVPB
3.3750 g | Freq: Three times a day (TID) | INTRAVENOUS | Status: DC
  Administered 2021-07-06 – 2021-07-17 (×33): 3.375 g via INTRAVENOUS
  Filled 2021-07-06 (×33): qty 50

## 2021-07-06 MED ORDER — DIPHENOXYLATE-ATROPINE 2.5-0.025 MG PO TABS
1.0000 | ORAL_TABLET | Freq: Two times a day (BID) | ORAL | Status: DC
  Administered 2021-07-06 – 2021-07-07 (×3): 1 via ORAL
  Filled 2021-07-06 (×3): qty 1

## 2021-07-06 MED ORDER — CALCIUM POLYCARBOPHIL 625 MG PO TABS: 625.0000 mg | ORAL_TABLET | Freq: Every day | ORAL | Status: DC

## 2021-07-06 NOTE — Progress Notes (Addendum)
12 Days Post-Op  Subjective: CC: Patient reports that he has some right sided abdominal pain that is stable from yesterday. Tolerating diet without n/v. Colostomy w/ 1.695L/24 hours.   Objective: Vital signs in last 24 hours: Temp:  [98.3 F (36.8 C)-99.2 F (37.3 C)] 98.6 F (37 C) (07/21 0447) Pulse Rate:  [75-83] 83 (07/21 0447) Resp:  [18-22] 18 (07/20 2006) BP: (92-113)/(61-65) 92/65 (07/21 0447) SpO2:  [96 %-97 %] 96 % (07/21 0447) Weight:  [137.8 kg] 137.8 kg (07/21 0447) Last BM Date: 07/05/21  Intake/Output from previous day: 07/20 0701 - 07/21 0700 In: 1640.7 [P.O.:680; I.V.:960.7] Out: 4492 [Urine:2775; Drains:22; Stool:1695] Intake/Output this shift: Total I/O In: -  Out: 800 [Urine:450; Stool:350]  PE: Gen:  Alert, NAD, pleasant Heart: RRR Pulm: CTA b/l, normal rate and effort  Abd: Soft, mild distension, tenderness on the right side of abdomen without peritonitis. +BS. Midline wound stable from yesterday w/ beefy red granulation tissue on wound edges. Wound with dehiscence without evisceration. The base of the wound is with some thin clear drainage. There is 5cm of tracking inferior portion and it appears the fascia is intact w/ palpation. There is ~5cm of tracking superior aspect of the wound. Colostomy with semisolid/liquid stool in bag. Colostomy bag with stool in bag. Stoma pink, budded and viable. JP drain with thin brown output this morning.  Psych: A&Ox3 Skin: no rashes noted, warm and dry    Lab Results:  Recent Labs    07/04/21 0745 07/05/21 0849  WBC 16.0* 15.3*  HGB 11.5* 10.8*  HCT 34.2* 32.8*  PLT 210 249   BMET Recent Labs    07/05/21 0849 07/06/21 0341  NA 143 142  K 3.9 3.9  CL 117* 115*  CO2 21* 19*  GLUCOSE 98 91  BUN 10 9  CREATININE 0.80 0.79  CALCIUM 7.7* 7.8*   PT/INR No results for input(s): LABPROT, INR in the last 72 hours. CMP     Component Value Date/Time   NA 142 07/06/2021 0341   K 3.9 07/06/2021 0341    CL 115 (H) 07/06/2021 0341   CO2 19 (L) 07/06/2021 0341   GLUCOSE 91 07/06/2021 0341   BUN 9 07/06/2021 0341   CREATININE 0.79 07/06/2021 0341   CALCIUM 7.8 (L) 07/06/2021 0341   PROT 7.4 06/24/2021 0247   ALBUMIN 3.1 (L) 06/24/2021 0247   AST 12 (L) 06/24/2021 0247   ALT 11 06/24/2021 0247   ALKPHOS 69 06/24/2021 0247   BILITOT 1.3 (H) 06/24/2021 0247   GFRNONAA >60 07/06/2021 0341   Lipase     Component Value Date/Time   LIPASE 22 06/23/2021 1109    Studies/Results: CT ABDOMEN PELVIS W CONTRAST  Result Date: 07/05/2021 CLINICAL DATA:  Colonic obstruction status post colostomy with Hartmann pouch on 06/24/2021, increasing white blood cell count Purulent drainage from his incision EXAM: CT ABDOMEN AND PELVIS WITH CONTRAST TECHNIQUE: Multidetector CT imaging of the abdomen and pelvis was performed using the standard protocol following bolus administration of intravenous contrast. CONTRAST:  42mL OMNIPAQUE IOHEXOL 350 MG/ML SOLN COMPARISON:  CT abdomen pelvis 07/01/2021, CT abdomen pelvis 06/23/2021 FINDINGS: Lower chest: No acute abnormality. Hepatobiliary: No focal liver abnormality. No gallstones, gallbladder wall thickening, or pericholecystic fluid. No biliary dilatation. Pancreas: No focal lesion. Normal pancreatic contour. No surrounding inflammatory changes. No main pancreatic ductal dilatation. Spleen: Normal in size without focal abnormality. Adrenals/Urinary Tract: No adrenal nodule bilaterally. Bilateral kidneys enhance symmetrically. There is a 1.3 cm fluid density  lesion within left kidney that likely represents a simple renal cyst. Similar pericentimeter finding within right kidney. No hydronephrosis. No hydroureter. The urinary bladder is unremarkable. Stomach/Bowel: PO contrast reaches the large bowel. Redemonstration of a Hartmann pouch formation and left lower lobe end colostomy formation. Stomach is within normal limits. No evidence of bowel wall thickening or dilatation.  No pneumatosis. The appendix not definitely identified. Vascular/Lymphatic: No abdominal aorta or iliac aneurysm. Trace atherosclerotic plaque. No abdominal, pelvic, or inguinal lymphadenopathy. Reproductive: Enlarged prostate measuring up to 6 cm. Other: Right lower lobe surgical drain with tip terminating within the pelvis. Persistent trace free fluid. Region of fat stranding the left retroperitoneum again noted (2:63) with associated trace free fluid likely postsurgical in etiology. Similar slightly more conspicuous finding just inferior to the incision within the mid abdomen. No associated organized fluid collection. No intraperitoneal free gas. Musculoskeletal: Interval dehiscence of the midline anterior abdominal incision. No associated organized fluid collection. No suspicious lytic or blastic osseous lesions. No acute displaced fracture. Multilevel degenerative changes of the spine. IMPRESSION: 1. Anterior midline abdominal wound dehiscence. 2. Interval improvement of postop ileus. 3. Otherwise no gross change compared to CT abdomen pelvis 07/01/2021 in a patient status post Hartmann pouch formation and left end colostomy formation. 4. Other imaging findings of potential clinical significance: Prostatomegaly. Aortic Atherosclerosis (ICD10-I70.0) -trace. Electronically Signed   By: Tish Frederickson M.D.   On: 07/05/2021 21:51    Anti-infectives: Anti-infectives (From admission, onward)    Start     Dose/Rate Route Frequency Ordered Stop   06/24/21 2200  cefoTEtan (CEFOTAN) 2 g in sodium chloride 0.9 % 100 mL IVPB        2 g 200 mL/hr over 30 Minutes Intravenous Every 12 hours 06/24/21 1514 06/24/21 2109   06/24/21 0830  cefoTEtan (CEFOTAN) 2 g in sodium chloride 0.9 % 100 mL IVPB        2 g 200 mL/hr over 30 Minutes Intravenous On call to O.R. 06/24/21 0733 06/24/21 1117        Assessment/Plan POD 12 s/p open sigmoid colectomy with end colostomy for obstructing sigmoid colon mass 06/24/21 Dr.  Freida Busman - Pathology consistent with diverticulitis - CT 7/16 without abscess. There was a small amount of gas adjacent to staple line at the site of colon resection. Repeat CT scan yesterday without any intra-abdominal complication, however his JP drain around his rectal stump is brown thin fluid today that makes me concerned for a rectal stump leak. I called radiology to review case and in re-reviewing the images it does appear that he has a rectal stump leak. Cont drain. Make NPO and will discuss with MD. Start Zosyn. - Noted purulent drainage around the stoma on 7/20. No abscess seen on CT. Will wick next time colostomy bag is changed - Midline BID WTD - High output from colostomy. C. Diff negative. On metamucil and Lomotil. NPO for now. Monitor and trend output. Monitor for dehydration.  - Mobilize, PT/OT - SNF vs CIR    FEN: NPO. IVF per [facility] VTE: SCDs, SQH ID: cefotetan x2 doses. Start Zosyn Foley - External    COVID + - per [facility] Morbid Obesity - BMI 49.89 BPH  AKI - resolved    LOS: 13 days    Jacinto Halim , [facility] 07/06/2021, 10:54 AM Please see Amion for pager number during day hours 7:00am-4:30pm

## 2021-07-06 NOTE — Progress Notes (Signed)
Physical Therapy Treatment Patient Details Name: Travis Calderon MRN: [id number] DOB: [birth year] Today's Date: 07/06/2021    History of Present Illness This 65 years old male with PMH significant for obesity, diverticulosis presented to the ED 06/23/21 with complaints of abdominal pain and abnormal bowel movements.He is found to be COVID-positive, CT abdomen shows large obstructing colonic mass.  Patient underwent Open sigmoid colectomy with end colostomy.on 06/24/21.Incisional wound VAC given massive distention, shock, contamination.  positive for covid    PT Comments    The patient demonstrates improved mobility, still reliant on HOB raised and rails. Patient ambulated x 100' using Rw.  Patient  tolerated well. Noted dyspnea 3/4.  Note that patient may return to surgery. Continue PT. Patient would benefit from CIR to address multiple medical issues and improve independence  Follow Up Recommendations  CIR      Equipment Recommendations  Rolling walker with 5" wheels    Recommendations for Other Services       Precautions / Restrictions Precautions Precautions: Fall Precaution Comments: colostomy,  right JP drain, urinary urgency,, large abdominal  wound/dressing    Mobility  Bed Mobility   Bed Mobility: Rolling;Sidelying to Sit;Sit to Sidelying Rolling: Min guard Sidelying to sit: Supervision     Sit to sidelying: Mod assist General bed mobility comments: cues for abdominal precautions. Extra time and use of rail and HOB raised to sit up but did not require extra assist. mod assist for legs back onto bed when  attempting to go to side.    Transfers Overall transfer level: Needs assistance Equipment used: Rolling walker (2 wheeled) Transfers: Sit to/from Stand Sit to Stand: Min guard;From elevated surface         General transfer comment: patient needs increased time and min cues for hand placement to stand from elevated bed height,  Ambulation/Gait Ambulation/Gait  assistance: Min guard Gait Distance (Feet): 100 Feet Assistive device: Rolling walker (2 wheeled) Gait Pattern/deviations: Step-through pattern Gait velocity: decr   General Gait Details: gait is slow and steady, manages Rw  in turns and around objects.   Stairs             Wheelchair Mobility    Modified Rankin (Stroke Patients Only)       Balance                                            Cognition Arousal/Alertness: Awake/alert Behavior During Therapy: WFL for tasks assessed/performed                                          Exercises      General Comments        Pertinent Vitals/Pain Faces Pain Scale: Hurts little more Pain Location: back, abdomen Pain Descriptors / Indicators: Aching;Grimacing Pain Intervention(s): Monitored during session    Home Living                      Prior Function            PT Goals (current goals can now be found in the care plan section) Progress towards PT goals: Progressing toward goals    Frequency    Min 3X/week      PT Plan Current plan remains appropriate;Frequency needs to  be updated    Co-evaluation              AM-PAC PT "6 Clicks" Mobility   Outcome Measure  Help needed turning from your back to your side while in a flat bed without using bedrails?: A Little Help needed moving from lying on your back to sitting on the side of a flat bed without using bedrails?: A Little Help needed moving to and from a bed to a chair (including a wheelchair)?: A Little Help needed standing up from a chair using your arms (e.g., wheelchair or bedside chair)?: A Little Help needed to walk in hospital room?: A Little Help needed climbing 3-5 steps with a railing? : A Lot 6 Click Score: 17    End of Session   Activity Tolerance: Patient tolerated treatment well Patient left: in bed;with call bell/phone within reach Nurse Communication: Mobility status PT Visit  Diagnosis: Unsteadiness on feet (R26.81);Difficulty in walking, not elsewhere classified (R26.2)     Time: 3151-7616 PT Time Calculation (min) (ACUTE ONLY): 24 min  Charges:  $Gait Training: 23-37 mins                     Blanchard Kelch PT Acute Rehabilitation Services Pager [phone] Office [phone]   Rada Hay 07/06/2021, 4:04 PM

## 2021-07-06 NOTE — Progress Notes (Signed)
PROGRESS NOTE    Travis Calderon  NOI:[id number] DOB: 27-Mar-1956 DOA: 06/23/2021 PCP: [facility]    Brief Narrative:  Patient is 65 year old gentleman with history of obesity, diverticulosis presented to the emergency room with complaints of abdominal pain and abnormal bowel movements.  He was having symptoms for last few days with distention and nausea.  Patient also complained of chronic diarrhea.  In the emergency room he was found to be positive for COVID-19, CT scan abdomen pelvis showed large obstructing colonic mass.  Admitted to hospital and underwent colectomy with colostomy.  Hospital course complicated with ATN.  Stabilizing now.   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  Treated with IV antibiotics and fluid. Status post sigmoid colectomy and end colostomy on 7/9.  Postoperative course complicated by ileus and now resolved. Followed by surgery, persistent elevated white blood cell count, repeat CT scan and JP drain with possible stump leak.  Surgery deciding whether for reexploration.  We will discuss over antibiotic with surgery team. Currently tolerating regular diet.  Advance activities. Pathology consistent with diverticulitis, no evidence of malignancy.  COVID-19 viral infection: Mostly asymptomatic.  No evidence of pneumonia.  Isolation precaution discontinued.  Acute kidney injury: Prerenal injury.  Now with improved appetite.  Renal  functions normalized. Remains on maintenance IV fluids with potassium.  Continue today.  Electrolyte abnormalities: Mostly improved.  Continue to work with PT OT.  May need repeat exploration. Discussed with surgery team, started on Zosyn. Hold off on any discharge plans now.    DVT prophylaxis: heparin injection 5,000 Units Start: 06/26/21 0900 SCDs Start: 06/23/21 1845   Code Status: Full code Family Communication: None today. Disposition Plan: Status is: Inpatient  Remains inpatient appropriate because:Unsafe d/c plan  Dispo: The patient is from: Home              Anticipated d/c is to: CIR              Patient currently is not medically stable.  Needs surgical stability before discharge.   Difficult to place patient No         Consultants:  General surgery  Procedures:  Hartmann procedure 7/9  Antimicrobials:  Completed antibiotics. Zosyn 7/21----   Subjective: Patient seen and examined.  Denies any nausea or vomiting.  Afebrile.  Has mild abdominal pain.  Adequate colostomy output.  Objective: Vitals:   07/05/21 0503 07/05/21 1236 07/05/21 2006 07/06/21 0447  BP: 104/62 113/62 94/61 92/65   Pulse: 79 75 80 83  Resp: 20 (!) 22 18   Temp: 98.6 F (37 C) 98.3 F (36.8 C) 99.2 F (37.3 C) 98.6 F (37 C)  TempSrc: Oral Oral Oral Oral  SpO2: 97% 97% 97% 96%  Weight:    (!) 137.8 kg  Height:        Intake/Output Summary (Last 24 hours) at 07/06/2021 1152 Last data filed at 07/06/2021 0943 Gross per 24 hour  Intake 1420.66 ml  Output 4512 ml  Net -3091.34 ml   [employer]  07/01/21 0559 07/02/21 0458 07/06/21 0447  Weight: (!) 142.4 kg (!) 141.5 kg (!) 137.8 kg    Examination:  General exam: Appears calm and comfortable not in any distress. Respiratory system: Clear to auscultation. Respiratory effort normal.  No added sounds. Cardiovascular system: S1 & S2 heard, RRR.  No edema.   Gastrointestinal system: Abdomen is large and pendulous.   Mildly tender on deep palpation.  No rigidity or guarding. Midline surgical incision inspected by surgery, pictures in media section. Colostomy with loose stool, looks functioning well.  Bowel sounds present. JP drain right lower quadrant with feculent material.     Data Reviewed: I have personally reviewed following labs and imaging studies  CBC: Recent Labs  Lab 06/30/21 0324 07/01/21 0237 07/02/21 0340 07/03/21 0332 07/04/21 0745 07/05/21 0849  WBC 16.0* 19.9* 18.4* 16.2* 16.0* 15.3*  NEUTROABS 12.9* 16.7* 15.4* 13.5*  --   --   HGB 12.9* 13.6 12.5* 11.8* 11.5* 10.8*  HCT 38.4* 41.8 37.0* 35.4* 34.2* 32.8*  MCV 87.3 91.3 86.9 88.1 87.0 89.1  PLT 220 217 229 220 210 249   Basic Metabolic Panel: Recent Labs  Lab 06/30/21 0324 06/30/21 1607 07/02/21 0340 07/03/21 0332 07/04/21 0745 07/05/21 0849 07/06/21 0341  NA 141   < > 140 139 143 143 142  K 3.0*   < > 3.5 3.4* 3.7 3.9 3.9  CL 110   < > 114* 114* 118* 117* 115*  CO2 21*   < > 19* 18* 20* 21* 19*  GLUCOSE 97   < > 108* 102* 103* 98 91  BUN 41*   < > 27* 16 14 10 9   CREATININE 1.29*   < > 0.89 0.82 0.84 0.80 0.79  CALCIUM 7.9*   < > 7.8* 7.6* 7.9* 7.7* 7.8*  MG 2.7*  --   --   --   --   --   --   PHOS 2.7  --   --   --   --   --   --    < > = values in this interval not displayed.   GFR: Estimated Creatinine Clearance: 123.4 mL/min (by C-G formula based on SCr of 0.79 mg/dL). Liver Function Tests: No results for input(s): AST, ALT, ALKPHOS, BILITOT, PROT, ALBUMIN in the last 168 hours. No results for input(s): LIPASE, AMYLASE in the last 168 hours. No results for input(s): AMMONIA in the last 168 hours. Coagulation Profile: No results for input(s): INR, PROTIME in the last 168 hours. Cardiac Enzymes: No results for input(s): CKTOTAL, CKMB, CKMBINDEX, TROPONINI in the last 168 hours. BNP (last 3 results) No results for input(s): PROBNP in the last 8760 hours. HbA1C: No results for input(s): HGBA1C in the  last 72 hours. CBG: Recent Labs  Lab 06/29/21 2051  GLUCAP 103*   Lipid Profile: No results for input(s): CHOL, HDL, LDLCALC, TRIG, CHOLHDL, LDLDIRECT in the last 72 hours. Thyroid Function Tests: No results for input(s): TSH, T4TOTAL, FREET4, T3FREE, THYROIDAB in the last 72 hours. Anemia Panel: No results for input(s): VITAMINB12, FOLATE, FERRITIN, TIBC, IRON, RETICCTPCT in the last 72 hours. Sepsis Labs: No results for input(s): PROCALCITON, LATICACIDVEN in the last 168 hours.  Recent Results (from the past 240 hour(s))  C Difficile Quick Screen (NO PCR Reflex)     Status: None   Collection Time: 06/30/21 11:53 AM   Specimen: STOOL  Result Value Ref Range Status   C Diff antigen NEGATIVE NEGATIVE Final   C Diff toxin NEGATIVE  NEGATIVE Final   C Diff interpretation No C. difficile detected.  Final    Comment: Performed at [facility], [address], [place], [place] [address]         Radiology Studies: CT ABDOMEN PELVIS W CONTRAST  Result Date: 07/05/2021 CLINICAL DATA:  Colonic obstruction status post colostomy with Gertie Gowda pouch on 06/24/2021, increasing white blood cell count Purulent drainage from his incision EXAM: CT ABDOMEN AND PELVIS WITH CONTRAST TECHNIQUE: Multidetector CT imaging of the abdomen and pelvis was performed using the standard protocol following bolus administration of intravenous contrast. CONTRAST:  86mL OMNIPAQUE IOHEXOL 350 MG/ML SOLN COMPARISON:  CT abdomen pelvis 07/01/2021, CT abdomen pelvis 06/23/2021 FINDINGS: Lower chest: No acute abnormality. Hepatobiliary: No focal liver abnormality. No gallstones, gallbladder wall thickening, or pericholecystic fluid. No biliary dilatation. Pancreas: No focal lesion. Normal pancreatic contour. No surrounding inflammatory changes. No main pancreatic ductal dilatation. Spleen: Normal in size without focal abnormality. Adrenals/Urinary Tract: No adrenal nodule bilaterally. Bilateral kidneys  enhance symmetrically. There is a 1.3 cm fluid density lesion within left kidney that likely represents a simple renal cyst. Similar pericentimeter finding within right kidney. No hydronephrosis. No hydroureter. The urinary bladder is unremarkable. Stomach/Bowel: PO contrast reaches the large bowel. Redemonstration of a Hartmann pouch formation and left lower lobe end colostomy formation. Stomach is within normal limits. No evidence of bowel wall thickening or dilatation. No pneumatosis. The appendix not definitely identified. Vascular/Lymphatic: No abdominal aorta or iliac aneurysm. Trace atherosclerotic plaque. No abdominal, pelvic, or inguinal lymphadenopathy. Reproductive: Enlarged prostate measuring up to 6 cm. Other: Right lower lobe surgical drain with tip terminating within the pelvis. Persistent trace free fluid. Region of fat stranding the left retroperitoneum again noted (2:63) with associated trace free fluid likely postsurgical in etiology. Similar slightly more conspicuous finding just inferior to the incision within the mid abdomen. No associated organized fluid collection. No intraperitoneal free gas. Musculoskeletal: Interval dehiscence of the midline anterior abdominal incision. No associated organized fluid collection. No suspicious lytic or blastic osseous lesions. No acute displaced fracture. Multilevel degenerative changes of the spine. IMPRESSION: 1. Anterior midline abdominal wound dehiscence. 2. Interval improvement of postop ileus. 3. Otherwise no gross change compared to CT abdomen pelvis 07/01/2021 in a patient status post Hartmann pouch formation and left end colostomy formation. 4. Other imaging findings of potential clinical significance: Prostatomegaly. Aortic Atherosclerosis (ICD10-I70.0) -trace. Electronically Signed   By: Tish Frederickson M.D.   On: 07/05/2021 21:51        Scheduled Meds:  chlorhexidine  15 mL Mouth Rinse BID   diphenoxylate-atropine  1 tablet Oral BID    famotidine  20 mg Oral Daily   heparin injection (subcutaneous)  5,000 Units Subcutaneous Q8H   lip balm  1 application Topical BID   mouth rinse  15 mL Mouth Rinse BID   melatonin  3 mg Oral QHS   potassium chloride  20 mEq Oral BID   psyllium  1 packet Oral BID   Continuous Infusions:  0.9 % NaCl with KCl 40 mEq / L 50 mL/hr at 07/06/21 0600     LOS: 13 days    Time spent: 32 minutes     Dorcas Carrow, MD [facility] Pager [phone]

## 2021-07-06 NOTE — Progress Notes (Signed)
PHARMACY NOTE -  Zosyn  Pharmacy has been assisting with dosing of Zosyn for IAI. Dosage remains stable at 3.375 g IV q8 hr and further renal adjustments per institutional Pharmacy antibiotic protocol  Pharmacy will sign off, following peripherally for culture results or dose adjustments. Please reconsult if a change in clinical status warrants re-evaluation of dosage.  Bernadene Person, PharmD, BCPS [phone] 07/06/2021, 12:35 PM

## 2021-07-06 NOTE — Care Management Important Message (Signed)
Important Message  Patient Details IM Letter given to the Patient. Name: Izyk Marty MRN: [id number] Date of Birth: Mar 02, 1956   Medicare Important Message Given:  Yes     Caren Macadam 07/06/2021, 12:13 PM

## 2021-07-06 NOTE — TOC Progression Note (Addendum)
Transition of Care [facility]) - Progression Note    Patient Details  Name: Travis Calderon MRN: [id number] Date of Birth: 1956/09/09  Transition of Care [facility]) CM/SW Contact  Darleene Cleaver, [place] Phone Number: 07/06/2021, 3:48 PM  Clinical Narrative:     CSW was informed that patient will be going back to surgery.  PT will need to see patient again and see if they think he would be a good candidate for CIR.  CSW to continue to follow patient's progress throughout discharge planning.    Expected Discharge Plan and Services  CIR vs SNF                                               Social Determinants of Health (SDOH) Interventions    Readmission Risk Interventions No flowsheet data found.

## 2021-07-07 DIAGNOSIS — K56609 Unspecified intestinal obstruction, unspecified as to partial versus complete obstruction: Secondary | ICD-10-CM | POA: Diagnosis not present

## 2021-07-07 LAB — CBC WITH DIFFERENTIAL/PLATELET
Abs Immature Granulocytes: 0.09 10*3/uL — ABNORMAL HIGH (ref 0.00–0.07)
Basophils Absolute: 0 10*3/uL (ref 0.0–0.1)
Basophils Relative: 0 %
Eosinophils Absolute: 0.2 10*3/uL (ref 0.0–0.5)
Eosinophils Relative: 1 %
HCT: 32.4 % — ABNORMAL LOW (ref 39.0–52.0)
Hemoglobin: 10.8 g/dL — ABNORMAL LOW (ref 13.0–17.0)
Immature Granulocytes: 1 %
Lymphocytes Relative: 5 %
Lymphs Abs: 0.8 10*3/uL (ref 0.7–4.0)
MCH: 29.8 pg (ref 26.0–34.0)
MCHC: 33.3 g/dL (ref 30.0–36.0)
MCV: 89.5 fL (ref 80.0–100.0)
Monocytes Absolute: 1.7 10*3/uL — ABNORMAL HIGH (ref 0.1–1.0)
Monocytes Relative: 11 %
Neutro Abs: 13 10*3/uL — ABNORMAL HIGH (ref 1.7–7.7)
Neutrophils Relative %: 82 %
Platelets: 308 10*3/uL (ref 150–400)
RBC: 3.62 MIL/uL — ABNORMAL LOW (ref 4.22–5.81)
RDW: 14.9 % (ref 11.5–15.5)
WBC: 15.9 10*3/uL — ABNORMAL HIGH (ref 4.0–10.5)
nRBC: 0 % (ref 0.0–0.2)

## 2021-07-07 LAB — BASIC METABOLIC PANEL
Anion gap: 5 (ref 5–15)
BUN: 9 mg/dL (ref 8–23)
CO2: 17 mmol/L — ABNORMAL LOW (ref 22–32)
Calcium: 7.5 mg/dL — ABNORMAL LOW (ref 8.9–10.3)
Chloride: 118 mmol/L — ABNORMAL HIGH (ref 98–111)
Creatinine, Ser: 1.03 mg/dL (ref 0.61–1.24)
GFR, Estimated: >60 mL/min (ref >60)
Glucose, Bld: 95 mg/dL (ref 70–99)
Potassium: 4 mmol/L (ref 3.5–5.1)
Sodium: 140 mmol/L (ref 135–145)

## 2021-07-07 NOTE — Progress Notes (Addendum)
13 Days Post-Op  Subjective: CC: Still having some soreness on the right side of his abdomen, adjacent to his wound. No other areas of abdominal pain. No n/v. Tolerating diet. Having colostomy output.   Objective: Vital signs in last 24 hours: Temp:  [97.7 F (36.5 C)-99.1 F (37.3 C)] 98.6 F (37 C) (07/22 0453) Pulse Rate:  [79-86] 79 (07/22 0453) Resp:  [20] 20 (07/22 0453) BP: (90-107)/(60-70) 90/63 (07/22 0453) SpO2:  [97 %-98 %] 98 % (07/22 0453) Weight:  [139.2 kg] 139.2 kg (07/22 0459) Last BM Date: 07/07/21  Intake/Output from previous day: 07/21 0701 - 07/22 0700 In: 1410.3 [P.O.:360; I.V.:940.8; IV Piggyback:109.5] Out: 3660 [Urine:2150; Drains:10; Stool:1500] Intake/Output this shift: No intake/output data recorded.  PE: Gen:  Alert, NAD, pleasant Pulm: normal rate and effort  Abd: Soft, mild distension, tenderness on the right side of abdomen without peritonitis. +BS. Midline wound with dehiscence without evisceration. There is granulation tissue on wound edges and base with some fibrinous tissue. There is 5cm of tracking inferior portion and it appears the fascia is intact w/ palpation. There is ~5cm of tracking superior aspect of the wound. Colostomy with semisolid/liquid stool in bag. Stoma pink, budded and viable. There is mucocutaneous separation at the inferior portion of the stoma w/ purulent drainage that is able to be expressed from the wound (see picture below). Fascia appears intact when probed with sterile cotton swab below. The area was wicked. JP drain with thin brown output this morning.  Psych: A&Ox3 Skin: no rashes noted, warm and dry            Lab Results:  Recent Labs    07/05/21 0849 07/07/21 0330  WBC 15.3* 15.9*  HGB 10.8* 10.8*  HCT 32.8* 32.4*  PLT 249 308   BMET Recent Labs    07/06/21 0341 07/07/21 0330  NA 142 140  K 3.9 4.0  CL 115* 118*  CO2 19* 17*  GLUCOSE 91 95  BUN 9 9  CREATININE 0.79 1.03  CALCIUM  7.8* 7.5*   PT/INR No results for input(s): LABPROT, INR in the last 72 hours. CMP     Component Value Date/Time   NA 140 07/07/2021 0330   K 4.0 07/07/2021 0330   CL 118 (H) 07/07/2021 0330   CO2 17 (L) 07/07/2021 0330   GLUCOSE 95 07/07/2021 0330   BUN 9 07/07/2021 0330   CREATININE 1.03 07/07/2021 0330   CALCIUM 7.5 (L) 07/07/2021 0330   PROT 7.4 06/24/2021 0247   ALBUMIN 3.1 (L) 06/24/2021 0247   AST 12 (L) 06/24/2021 0247   ALT 11 06/24/2021 0247   ALKPHOS 69 06/24/2021 0247   BILITOT 1.3 (H) 06/24/2021 0247   GFRNONAA >60 07/07/2021 0330   Lipase     Component Value Date/Time   LIPASE 22 06/23/2021 1109    Studies/Results: CT ABDOMEN PELVIS W CONTRAST  Addendum Date: 07/06/2021   ADDENDUM REPORT: 07/06/2021 15:01 ADDENDUM: Further history reports putting out of feculent content from pelvic JP drain. Please note findings and impression should note that there is dehiscence of the Hartmann pouch suture (2:74-76). The fat stranding within the lower abdomen/pelvis and associated free fluid noted within the dictation likely reflects extravasated stool from the lumen of the rectum instead of postsurgical changes as noted. Associated free gas within the pelvis is noted (2:73-76). These results were kindly called by telephone on 07/06/2021 at 11:10 a.m. by Dr. Patrina Levering to provider PA [facility] , who verbally acknowledged  these results. Electronically Signed   By: Tish Frederickson M.D.   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 CONTRAST:  1mL OMNIPAQUE IOHEXOL 350 MG/ML SOLN COMPARISON:  CT abdomen pelvis  07/01/2021, CT abdomen pelvis 06/23/2021 FINDINGS: Lower chest: No acute abnormality. Hepatobiliary: No focal liver abnormality. No gallstones, gallbladder wall thickening, or pericholecystic fluid. No biliary dilatation. Pancreas: No focal lesion. Normal pancreatic contour. No surrounding inflammatory changes. No main pancreatic ductal dilatation. Spleen: Normal in size without focal abnormality. Adrenals/Urinary Tract: No adrenal nodule bilaterally. Bilateral kidneys enhance symmetrically. There is a 1.3 cm fluid density lesion within left kidney that likely represents a simple renal cyst. Similar pericentimeter finding within right kidney. No hydronephrosis. No hydroureter. The urinary bladder is unremarkable. Stomach/Bowel: PO contrast reaches the large bowel. Redemonstration of a Hartmann pouch formation and left lower lobe end colostomy formation. Stomach is within normal limits. No evidence of bowel wall thickening or dilatation. No pneumatosis. The appendix not definitely identified. Vascular/Lymphatic: No abdominal aorta or iliac aneurysm. Trace atherosclerotic plaque. No abdominal, pelvic, or inguinal lymphadenopathy. Reproductive: Enlarged prostate measuring up to 6 cm. Other: Right lower lobe surgical drain with tip terminating within the pelvis. Persistent trace free fluid. Region of fat stranding the left retroperitoneum again noted (2:63) with associated trace free fluid likely postsurgical in etiology. Similar slightly more conspicuous finding just inferior to the incision within the mid abdomen. No associated organized fluid collection. No intraperitoneal free gas. Musculoskeletal: Interval dehiscence of the midline anterior abdominal incision. No associated organized fluid collection. No suspicious lytic or blastic osseous lesions. No acute displaced fracture. Multilevel degenerative changes of the spine. IMPRESSION: 1. Anterior midline abdominal wound dehiscence. 2. Interval improvement of postop  ileus. 3. Otherwise no gross change compared to CT abdomen pelvis 07/01/2021 in a patient status post Hartmann pouch formation and left end colostomy formation. 4. Other imaging findings of potential clinical significance: Prostatomegaly. Aortic Atherosclerosis (ICD10-I70.0) -trace. Electronically Signed: By: Tish Frederickson M.D. On: 07/05/2021 21:51    Anti-infectives: Anti-infectives (From admission, onward)    Start     Dose/Rate Route Frequency Ordered Stop   07/06/21 1300  piperacillin-tazobactam (ZOSYN) IVPB 3.375 g        3.375 g 12.5 mL/hr over 240 Minutes Intravenous Every 8 hours 07/06/21 1235     06/24/21 2200  cefoTEtan (CEFOTAN) 2 g in sodium chloride 0.9 % 100 mL IVPB        2 g 200 mL/hr over 30 Minutes Intravenous Every 12 hours 06/24/21 1514 06/24/21 2109   06/24/21 0830  cefoTEtan (CEFOTAN) 2 g in sodium chloride 0.9 % 100 mL IVPB        2 g 200 mL/hr over 30 Minutes Intravenous On call to O.R. 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 Freida Busman - Pathology consistent with diverticulitis. No evidence of malignancy  - CT 7/21 w/ rectal stump leak. Continue drain and abx - Purulent drainage around the stoma on 7/20. No abscess seen on CT. Wicked  - Midline BID WTD.  - High output from colostomy. C. Diff negative. On  metamucil. Discussed with MD who recommended holding Lomotil for now. Monitor and trend output.  - Mobilize, PT/OT - SNF vs CIR    FEN: CM. IVF per [facility] VTE: SCDs, SQH ID: cefotetan x2 doses. Zosyn 7/21 >>  Foley - External    ABL anemia - hgb 10.8 and stable  COVID + - per [facility] Morbid Obesity - BMI 49.89 BPH  AKI - resolved    LOS: 14 days    Travis Calderon , [facility] Surgery 07/07/2021, 10:05 AM Please see Amion for pager number during day hours 7:00am-4:30pm

## 2021-07-07 NOTE — Progress Notes (Signed)
Inpatient Rehab Admissions Coordinator:   PT and OT have assessed pt and pt is mobilizing supervision with OT and min guard with PT.  More assist for ADLs.  No SLP needs. Unfortunately he would not qualify with only OT needs.  Note plans for possible surgery.  He his mobility significantly declines following any further surgery we can take another look for CIR.    Estill Dooms, PT, DPT Admissions Coordinator [phone] 07/07/21  12:25 PM

## 2021-07-07 NOTE — Progress Notes (Signed)
Occupational Therapy Treatment Patient Details Name: Travis Calderon MRN: [id number] DOB: 22-Oct-1956 Today's Date: 07/07/2021    History of present illness This 65 years old male with PMH significant for obesity, diverticulosis presented to the ED 06/23/21 with complaints of abdominal pain and abnormal bowel movements.He is found to be COVID-positive, CT abdomen shows large obstructing colonic mass.  Patient underwent Open sigmoid colectomy with end colostomy.on 06/24/21.Incisional wound VAC given massive distention, shock, contamination.  positive for covid   OT comments  Pt progressing well towards OT goals. Session focused on maximizing independence with ADL routine. Pt able to complete mobility to/from bathroom using bari RW at Supervision level. Pt initially able to stand for grooming/bathing tasks but fatigued after 5 minutes requiring remainder of tasks to be performed seated. Educated on energy conservation strategies with plan to provide handout for improved carryover. Pt continues to have difficulties with LB ADLs due to abdominal wound, so plan to educate on use of AE for tasks in next session. Continue to recommend CIR for intensive therapies.    Follow Up Recommendations  CIR    Equipment Recommendations  Other (comment) (bariatric bedside commode)    Recommendations for Other Services Rehab consult    Precautions / Restrictions Precautions Precautions: Fall Precaution Comments: colostomy,  right JP drain, urinary urgency,, large abdominal  wound/dressing Restrictions Weight Bearing Restrictions: No       Mobility Bed Mobility Overal bed mobility: Needs Assistance Bed Mobility: Supine to Sit     Supine to sit: Supervision;HOB elevated     General bed mobility comments: heavy use of bed rails    Transfers Overall transfer level: Needs assistance Equipment used: Rolling walker (2 wheeled) Transfers: Sit to/from Stand Sit to Stand: Supervision;From elevated  surface         General transfer comment: Increased time to power up, min cues for hand placement and request for higher surface to stand    Balance Overall balance assessment: Needs assistance Sitting-balance support: Feet supported Sitting balance-Leahy Scale: Good     Standing balance support: Single extremity supported;Bilateral upper extremity supported Standing balance-Leahy Scale: Fair Standing balance comment: fair static standing at sink, use of BUE support with mobility                           ADL either performed or assessed with clinical judgement   ADL Overall ADL's : Needs assistance/impaired     Grooming: Set up;Standing;Wash/dry face Grooming Details (indicate cue type and reason): able to wash face in standing but fatigued and opted to brush teeth while seated at sink Upper Body Bathing: Set up;Sitting   Lower Body Bathing: Minimal assistance;Sit to/from stand Lower Body Bathing Details (indicate cue type and reason): Difficulty reaching B feet due to abdominal wound/discomfort Upper Body Dressing : Set up;Sitting   Lower Body Dressing: Moderate assistance;Sit to/from stand Lower Body Dressing Details (indicate cue type and reason): Unable to reach feet, assist to don socks. Pt reports wearing mostly slippers at home. Discussed use of AE for LB ADLs in next session             Functional mobility during ADLs: Supervision/safety;Rolling walker General ADL Comments: Pt completed sponge bathing and dressing tasks at sink, limited by fatigue. Able to stand for 5 minutes before requiring seated rest break. Pt performed the remainder of ADLs seated on BSC at sink. Educated on energy conservation strategies (specifically use of shower chair)     Vision  Vision Assessment?: No apparent visual deficits   Perception     Praxis      Cognition Arousal/Alertness: Awake/alert Behavior During Therapy: WFL for tasks assessed/performed Overall  Cognitive Status: Within Functional Limits for tasks assessed                                          Exercises     Shoulder Instructions       General Comments Endorses feeling a little winded after ADLs    Pertinent Vitals/ Pain       Pain Assessment: Faces Faces Pain Scale: Hurts a little bit Pain Location: abdomen Pain Descriptors / Indicators: Grimacing;Guarding;Sore Pain Intervention(s): Monitored during session  Home Living                                          Prior Functioning/Environment              Frequency  Min 2X/week        Progress Toward Goals  OT Goals(current goals can now be found in the care plan section)  Progress towards OT goals: Progressing toward goals  Acute Rehab OT Goals Patient Stated Goal: return to independence, CIR for rehab OT Goal Formulation: With patient/family Time For Goal Achievement: 07/12/21 Potential to Achieve Goals: Good ADL Goals Pt Will Perform Lower Body Dressing: with supervision;with adaptive equipment;sit to/from stand Pt Will Transfer to Toilet: with supervision;regular height toilet Pt Will Perform Toileting - Clothing Manipulation and hygiene: with supervision  Plan Discharge plan remains appropriate    Co-evaluation                 AM-PAC OT "6 Clicks" Daily Activity     Outcome Measure   Help from another person eating meals?: None Help from another person taking care of personal grooming?: A Little Help from another person toileting, which includes using toliet, bedpan, or urinal?: A Little Help from another person bathing (including washing, rinsing, drying)?: A Little Help from another person to put on and taking off regular upper body clothing?: A Little Help from another person to put on and taking off regular lower body clothing?: A Lot 6 Click Score: 18    End of Session Equipment Utilized During Treatment: Rolling walker  OT Visit  Diagnosis: Other abnormalities of gait and mobility (R26.89);Pain Pain - part of body:  (abdomen)   Activity Tolerance Patient tolerated treatment well   Patient Left in chair;with call bell/phone within reach   Nurse Communication Mobility status        Time: 4098-1191 OT Time Calculation (min): 52 min  Charges: OT General Charges $OT Visit: 1 Visit OT Treatments $Self Care/Home Management : 38-52 mins  Bradd Canary, OTR/L Acute Rehab Services Office: [phone]    Lorre Munroe 07/07/2021, 11:46 AM

## 2021-07-07 NOTE — Progress Notes (Signed)
PROGRESS NOTE    Maximum Reiland  TIR:[id number] DOB: 1956/08/20 DOA: 06/23/2021 PCP: [facility]    Brief Narrative:  Patient is 65 year old gentleman with history of obesity, diverticulosis presented to the emergency room with complaints of abdominal pain and abnormal bowel movements.  He was having symptoms for last few days with distention and nausea.  Patient also complained of chronic diarrhea.  In the emergency room he was found to be positive for COVID-19, CT scan abdomen pelvis showed large obstructing colonic mass.  Admitted to hospital and underwent colectomy with colostomy.  Hospital course complicated with ATN.  Stabilizing now. 7/21, suspected stump dehiscence and remains on antibiotics.  Clinically stabilizing.   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 Treated with IV antibiotics and fluid. Status post sigmoid colectomy and end colostomy on 7/9.  Postoperative course complicated by ileus and now resolved. Followed by surgery, persistent elevated white blood cell count, repeat CT scan and JP drain with possible stump leak.  7/21, restarted on antibiotics with Zosyn.  Anticipating conservative management. Currently tolerating regular diet.  Advance activities. Pathology consistent with diverticulitis, no evidence of malignancy.  COVID-19 viral infection: Mostly asymptomatic.  No evidence of pneumonia.  Isolation precaution discontinued.  Acute kidney injury: Prerenal injury.  Now with  improved appetite.  Renal functions normalized. Remains on maintenance IV fluids with potassium.  Continue today.  Electrolyte abnormalities: Mostly improved.  Continue to work with PT OT.  Anticipate to CIR on discharge when surgically ready. Discussed with surgery team, started on Zosyn.     DVT prophylaxis: heparin injection 5,000 Units Start: 06/26/21 0900 SCDs Start: 06/23/21 1845   Code Status: Full code Family Communication: Sister Burna Mortimer at the bedside. Disposition Plan: Status is: Inpatient  Remains inpatient appropriate because:Unsafe d/c plan  Dispo: The patient is from: Home              Anticipated d/c is to: CIR              Patient currently is not medically stable.  Needs surgical stability before discharge.   Difficult to place patient No         Consultants:  General surgery  Procedures:  Hartmann procedure 7/9  Antimicrobials:  Completed antibiotics. Zosyn 7/21----   Subjective: Seen and examined.  No overnight events.  No pain.  Denies any nausea vomiting.  Tolerating diet.  Walked with support and walker.  Objective: Vitals:   07/06/21 1445 07/06/21 2029 07/07/21 0453 07/07/21 0459  BP: 107/70 99/60 90/63    Pulse: 82 86 79   Resp: 20 20 20    Temp: 97.7 F (36.5 C) 99.1 F (37.3 C) 98.6 F (37 C)   TempSrc:  Oral    SpO2: 98% 97% 98%   Weight:    (!) 139.2 kg  Height:        Intake/Output Summary (Last 24 hours) at 07/07/2021 1007 Last data filed at 07/07/2021 0501 Gross per 24 hour  Intake 1210.35 ml  Output 2860 ml  Net -1649.65 ml   07/09/2021  Weights   07/02/21 0458 07/06/21 0447 07/07/21 0459  Weight: (!) 141.5 kg (!) 137.8 kg (!) 139.2 kg    Examination:  General exam: Appears calm and comfortable not in any distress. Respiratory system: Bilateral clear. Cardiovascular system: S1 & S2 heard, RRR.  No edema.   Gastrointestinal system: Abdomen is large and pendulous.  Mildly tender on deep palpation.  No rigidity or  guarding. Midline surgical incision with some dehiscence. Colostomy with loose stool, looks functioning well.  Bowel sounds present. JP drain right lower quadrant with trace yellow material.     Data Reviewed: I have personally reviewed following labs and imaging studies  CBC: Recent Labs  Lab 07/01/21 0237 07/02/21 0340 07/03/21 0332 07/04/21 0745 07/05/21 0849 07/07/21 0330  WBC 19.9* 18.4* 16.2* 16.0* 15.3* 15.9*  NEUTROABS 16.7* 15.4* 13.5*  --   --  13.0*  HGB 13.6 12.5* 11.8* 11.5* 10.8* 10.8*  HCT 41.8 37.0* 35.4* 34.2* 32.8* 32.4*  MCV 91.3 86.9 88.1 87.0 89.1 89.5  PLT 217 229 220 210 249 308   Basic Metabolic Panel: Recent Labs  Lab 07/03/21 0332 07/04/21 0745 07/05/21 0849 07/06/21 0341 07/07/21 0330  NA 139 143 143 142 140  K 3.4* 3.7 3.9 3.9 4.0  CL 114* 118* 117* 115* 118*  CO2 18* 20* 21* 19* 17*  GLUCOSE 102* 103* 98 91 95  BUN 16 14 10 9 9   CREATININE 0.82 0.84 0.80 0.79 1.03  CALCIUM 7.6* 7.9* 7.7* 7.8* 7.5*   GFR: Estimated Creatinine Clearance: 96.4 mL/min (by C-G formula based on SCr of 1.03 mg/dL). Liver Function Tests: No results for input(s): AST, ALT, ALKPHOS, BILITOT, PROT, ALBUMIN in the last 168 hours. No results for input(s): LIPASE, AMYLASE in the last 168 hours. No results for input(s): AMMONIA in the last 168 hours. Coagulation Profile: No results for input(s): INR, PROTIME in the last 168 hours. Cardiac Enzymes: No results for input(s): CKTOTAL, CKMB, CKMBINDEX, TROPONINI in the last 168 hours. BNP (last 3 results) No results for input(s): PROBNP in the last 8760 hours. HbA1C: No results for input(s): HGBA1C in the last 72 hours. CBG: No results for input(s): GLUCAP in the last 168 hours.  Lipid Profile: No results for input(s): CHOL, HDL, LDLCALC, TRIG, CHOLHDL, LDLDIRECT in the last 72 hours. Thyroid Function Tests: No results for input(s): TSH, T4TOTAL, FREET4, T3FREE, THYROIDAB in the last 72 hours. Anemia Panel: No  results for input(s): VITAMINB12, FOLATE, FERRITIN, TIBC, IRON, RETICCTPCT in the last 72 hours. Sepsis Labs: No results for input(s): PROCALCITON, LATICACIDVEN in the last 168 hours.  Recent Results (from the past 240 hour(s))  C Difficile Quick Screen (NO PCR Reflex)     Status: None   Collection Time: 06/30/21 11:53 AM   Specimen: STOOL  Result Value Ref Range Status   C Diff antigen NEGATIVE NEGATIVE Final   C Diff toxin NEGATIVE NEGATIVE Final   C Diff interpretation No C. difficile detected.  Final    Comment: Performed at [facility], [address] [place], [place]         Radiology Studies: CT ABDOMEN PELVIS W CONTRAST  Addendum Date: 07/06/2021   ADDENDUM REPORT: 07/06/2021 15:01 ADDENDUM: Further history reports putting out of feculent content from pelvic JP drain. Please note findings and impression should note that there is dehiscence of the Hartmann pouch suture (2:74-76). The fat stranding within the lower abdomen/pelvis and associated free fluid noted within the dictation likely reflects extravasated stool from  the lumen of the rectum instead of postsurgical changes as noted. Associated free gas within the pelvis is noted (2:73-76). These results were kindly called by telephone on 07/06/2021 at 11:10 a.m. by Dr. Patrina Levering to provider [facility] , who verbally acknowledged these results. Electronically Signed   By: Tish Frederickson M.D.   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 CONTRAST:  24mL OMNIPAQUE IOHEXOL 350 MG/ML SOLN COMPARISON:  CT abdomen pelvis 07/01/2021, CT  abdomen pelvis 06/23/2021 FINDINGS: Lower chest: No acute abnormality. Hepatobiliary: No focal liver abnormality. No gallstones, gallbladder wall thickening, or pericholecystic fluid. No biliary dilatation. Pancreas: No focal lesion. Normal pancreatic contour. No surrounding inflammatory changes. No main pancreatic ductal dilatation. Spleen: Normal in size without focal abnormality. Adrenals/Urinary Tract: No adrenal nodule bilaterally. Bilateral kidneys enhance symmetrically. There is a 1.3 cm fluid density lesion within left kidney that likely represents a simple renal cyst. Similar pericentimeter finding within right kidney. No hydronephrosis. No hydroureter. The urinary bladder is unremarkable. Stomach/Bowel: PO contrast reaches the large bowel. Redemonstration of a Hartmann pouch formation and left lower lobe end colostomy formation. Stomach is within normal limits. No evidence of bowel wall thickening or dilatation. No pneumatosis. The appendix not definitely identified. Vascular/Lymphatic: No abdominal aorta or iliac aneurysm. Trace atherosclerotic plaque. No abdominal, pelvic, or inguinal lymphadenopathy. Reproductive: Enlarged prostate measuring up to 6 cm. Other: Right lower lobe surgical drain with tip terminating within the pelvis. Persistent trace free fluid. Region of fat stranding the left retroperitoneum again noted (2:63) with associated trace free fluid likely postsurgical in etiology. Similar slightly more conspicuous finding just inferior to the incision within the mid abdomen. No associated organized fluid collection. No intraperitoneal free gas. Musculoskeletal: Interval dehiscence of the midline anterior abdominal incision. No associated organized fluid collection. No suspicious lytic or blastic osseous lesions. No acute displaced fracture. Multilevel degenerative changes of the spine. IMPRESSION: 1. Anterior midline abdominal wound dehiscence. 2. Interval improvement of postop ileus. 3.  Otherwise no gross change compared to CT abdomen pelvis 07/01/2021 in a patient status post Hartmann pouch formation and left end colostomy formation. 4. Other imaging findings of potential clinical significance: Prostatomegaly. Aortic Atherosclerosis (ICD10-I70.0) -trace. Electronically Signed: By: Tish Frederickson M.D. On: 07/05/2021 21:51        Scheduled Meds:  chlorhexidine  15 mL Mouth Rinse BID   diphenoxylate-atropine  1 tablet Oral BID   famotidine  20 mg Oral Daily   heparin injection (subcutaneous)  5,000 Units Subcutaneous Q8H   lip balm  1 application Topical BID   mouth rinse  15 mL Mouth Rinse BID   melatonin  3 mg Oral QHS   potassium chloride  20 mEq Oral BID   psyllium  1 packet Oral BID   Continuous Infusions:  0.9 % NaCl with KCl 40 mEq / L 50 mL/hr at 07/06/21 2314   piperacillin-tazobactam (ZOSYN)  IV 3.375 g (07/07/21 0537)     LOS: 14 days    Time spent: 32 minutes     Dorcas Carrow, MD [facility] Pager [phone]

## 2021-07-07 NOTE — Consult Note (Signed)
WOC Nurse ostomy follow up COVID + isolation is discontinued today. Sister in room. [facility] PAs W. Marlyne Beards and Kirstie Mirza in to assess wound and stoma.  Their expertise and assistance is appreciated.  Stoma type/location: LUQ Colostomy Stomal assessment/size: 1 and 1/2 inch round, red, raised with lumen in center. Mucocutaneous separation from 4-9 o'clock measuring 6cm and angling down toward LLQ at deepest point (5'oclock) Peristomal assessment: intact Treatment options for stomal/peristomal skin: 1/4 inch packing strip of silver hydrofiber (Aquacel Ag+ Advantage) used to fill defect with 4cm "tail" of dressing left protruding from wound to serve as a wick.  This is selection for both its wicking capability but also its antimicrobial property. Output: Large amount of liquid stool in pouch.  Patient is again reminded to call for Nursing staff to empty pouch when 1/3 full until he is independent in emptying Ostomy pouching: 2pc., 2 and 3/4 in ch pouching system with aperture cut off-center to accommodate abdominal wound.  A skin barrier ring is used to cover the peristomal wound and enhance barrier seal. Education provided:  A complete pouching set up is provided to the bedside with aperture pre-cut and piece of silver hydrofiber prepared should a pouch change be required prior to Monday (day of next scheduled pouch change). Step-by-step pouching instructions are also provided.  3 pouching set ups are at bedside in addition to that previously mentioned.  Enrolled patient in [facility] Start Discharge program: Yes, previously  Next scheduled pouch change is Monday, 07/10/21.   WOC nursing team will follow for continued ostomy education and management and for collaboration on the patient's wounds with the nursing, surgical and medical teams.  Thank you, Ladona Mow, MSN, RN, Perlie Gold, [place]  Pager# [phone]

## 2021-07-08 DIAGNOSIS — K56609 Unspecified intestinal obstruction, unspecified as to partial versus complete obstruction: Secondary | ICD-10-CM | POA: Diagnosis not present

## 2021-07-08 LAB — CBC WITH DIFFERENTIAL/PLATELET
Band Neutrophils: 2 %
Basophils Relative: 1 %
Blasts: NONE SEEN %
Eosinophils Relative: 3 %
HCT: 31.3 % — ABNORMAL LOW (ref 39.0–52.0)
Hemoglobin: 10.6 g/dL — ABNORMAL LOW (ref 13.0–17.0)
Lymphocytes Relative: 5 %
MCH: 29.4 pg (ref 26.0–34.0)
MCHC: 33.9 g/dL (ref 30.0–36.0)
MCV: 86.9 fL (ref 80.0–100.0)
Metamyelocytes Relative: NONE SEEN %
Monocytes Relative: 5 %
Myelocytes: NONE SEEN %
Neutrophils Relative %: 84 %
Platelets: 320 10*3/uL (ref 150–400)
Promyelocytes Relative: NONE SEEN %
RBC Morphology: NORMAL
RBC: 3.6 MIL/uL — ABNORMAL LOW (ref 4.22–5.81)
RDW: 14.8 % (ref 11.5–15.5)
WBC Morphology: NORMAL
WBC: 13 10*3/uL — ABNORMAL HIGH (ref 4.0–10.5)
nRBC: 0 % (ref 0.0–0.2)
nRBC: NONE SEEN /100 WBC

## 2021-07-08 LAB — BASIC METABOLIC PANEL
Anion gap: 7 (ref 5–15)
BUN: 9 mg/dL (ref 8–23)
CO2: 19 mmol/L — ABNORMAL LOW (ref 22–32)
Calcium: 7.6 mg/dL — ABNORMAL LOW (ref 8.9–10.3)
Chloride: 114 mmol/L — ABNORMAL HIGH (ref 98–111)
Creatinine, Ser: 0.93 mg/dL (ref 0.61–1.24)
GFR, Estimated: >60 mL/min (ref >60)
Glucose, Bld: 95 mg/dL (ref 70–99)
Potassium: 3.9 mmol/L (ref 3.5–5.1)
Sodium: 140 mmol/L (ref 135–145)

## 2021-07-08 NOTE — Progress Notes (Signed)
PROGRESS NOTE    Travis Calderon  AQT:[id number] DOB: 08-Jun-1956 DOA: 06/23/2021 PCP: [facility]    Brief Narrative:  Patient is 65 year old gentleman with history of obesity, diverticulosis presented to the emergency room with complaints of abdominal pain and abnormal bowel movements.  He was having symptoms for last few days with distention and nausea.  Patient also complained of chronic diarrhea.  In the emergency room he was found to be positive for COVID-19, CT scan abdomen pelvis showed large obstructing colonic mass.  Admitted to hospital and underwent colectomy with colostomy.  Hospital course complicated with ATN.  Stabilizing now. 7/21, suspected stump dehiscence and remains on antibiotics.  Clinically stabilizing.   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 Treated with IV antibiotics and fluid. Status post sigmoid colectomy and end colostomy on 7/9.  Postoperative course complicated by ileus and now resolved. Followed by surgery, persistent elevated white blood cell count, repeat CT scan and JP drain with possible stump leak.  7/21, restarted on antibiotics with Zosyn.  Anticipating conservative management. Currently tolerating regular diet.  Advance activities. Pathology consistent with diverticulitis, no evidence of malignancy.  COVID-19 viral infection: Mostly asymptomatic.  No evidence of pneumonia.  Isolation precaution discontinued.  Acute kidney injury: Prerenal injury.  Now with  improved appetite.  Renal functions normalized. Remains on maintenance IV fluids with potassium.  Continue today.  Electrolyte abnormalities: Mostly improved.  Continue to work with PT OT.  Anticipate to CIR on discharge when surgically ready.  Patient can go to a SNF versus CIR when cleared by surgery.   DVT prophylaxis: heparin injection 5,000 Units Start: 06/26/21 0900 SCDs Start: 06/23/21 1845   Code Status: Full code Family Communication: Sister Travis Calderon on the phone. Disposition Plan: Status is: Inpatient  Remains inpatient appropriate because:Unsafe d/c plan  Dispo: The patient is from: Home              Anticipated d/c is to: CIR vs snf              Patient currently is not medically stable.  Needs surgical stability before discharge.   Difficult to place patient No         Consultants:  General surgery  Procedures:  Hartmann procedure 7/9  Antimicrobials:  Completed antibiotics. Zosyn 7/21----   Subjective: Seen and examined.  No new events.  Denies any complaints.  Sister was on the phone and was wondering why patient could not go to CIR.  Objective: Vitals:   07/07/21 0459 07/07/21 1355 07/07/21 2158 07/08/21 0420  BP:  (!) 104/53 (!) 101/56   Pulse:  78 84   Resp:  18 20   Temp:  98.3 F (36.8 C) 98.6 F (37 C)   TempSrc:  Oral Oral   SpO2:  99% 98%   Weight: (!) 139.2 kg   (!) 137.1 kg  Height:        Intake/Output Summary (Last 24 hours) at 07/08/2021 1033 Last data filed at 07/08/2021 0930 Gross per 24 hour  Intake 1657.85 ml  Output 3365 ml  Net -1707.15 ml   Filed Weights   07/06/21 0447 07/07/21 0459 07/08/21 0420  Weight: (!) 137.8 kg (!) 139.2 kg (!) 137.1 kg    Examination:  General exam: Appears calm and comfortable not in any distress. Respiratory system: Bilateral clear. Cardiovascular system: S1 & S2 heard, RRR.  No edema.   Gastrointestinal system: Abdomen is large and pendulous.  Mildly tender on deep palpation.  No  rigidity or guarding. Midline surgical incision with some dehiscence. Colostomy with loose stool, looks functioning well.  Bowel sounds present. JP drain right lower quadrant with trace yellow material.     Data Reviewed: I have personally reviewed following labs and imaging studies  CBC: Recent Labs  Lab 07/02/21 0340 07/03/21 0332 07/04/21 0745 07/05/21 0849 07/07/21 0330 07/08/21 0253  WBC 18.4* 16.2* 16.0* 15.3* 15.9* 13.0*  NEUTROABS 15.4* 13.5*  --   --  13.0*  --   HGB 12.5* 11.8* 11.5* 10.8* 10.8* 10.6*  HCT 37.0* 35.4* 34.2* 32.8* 32.4* 31.3*  MCV 86.9 88.1 87.0 89.1 89.5 86.9  PLT 229 220 210 249 308 320   Basic Metabolic Panel: Recent Labs  Lab 07/04/21 0745 07/05/21 0849 07/06/21 0341 07/07/21 0330 07/08/21 0253  NA 143 143 142 140 140  K 3.7 3.9 3.9 4.0 3.9  CL 118* 117* 115* 118* 114*  CO2 20* 21* 19* 17* 19*  GLUCOSE 103* 98 91 95 95  BUN 14 10 9 9 9   CREATININE 0.84 0.80 0.79 1.03 0.93  CALCIUM 7.9* 7.7* 7.8* 7.5* 7.6*   GFR: Estimated Creatinine Clearance: 105.8 mL/min (by C-G formula based on SCr of 0.93 mg/dL). Liver Function Tests: No results for input(s): AST, ALT, ALKPHOS, BILITOT, PROT, ALBUMIN in the last 168 hours. No results for input(s): LIPASE, AMYLASE in the last 168 hours. No results for input(s): AMMONIA in the last 168 hours. Coagulation Profile: No results for input(s): INR, PROTIME in the last 168 hours. Cardiac Enzymes: No results for input(s): CKTOTAL, CKMB, CKMBINDEX, TROPONINI in the last 168 hours. BNP (last 3 results) No results for input(s): PROBNP in the last 8760 hours. HbA1C: No results for input(s): HGBA1C in the last 72 hours. CBG: No results for input(s): GLUCAP in the last 168 hours.  Lipid Profile: No results for input(s): CHOL, HDL, LDLCALC, TRIG, CHOLHDL, LDLDIRECT in the last 72 hours. Thyroid Function Tests: No results for input(s): TSH, T4TOTAL, FREET4, T3FREE, THYROIDAB in the last 72 hours. Anemia  Panel: No results for input(s): VITAMINB12, FOLATE, FERRITIN, TIBC, IRON, RETICCTPCT in the last 72 hours. Sepsis Labs: No results for input(s): PROCALCITON, LATICACIDVEN in the last 168 hours.  Recent Results (from the past 240 hour(s))  C Difficile Quick Screen (NO PCR Reflex)     Status: None   Collection Time: 06/30/21 11:53 AM   Specimen: STOOL  Result Value Ref Range Status   C Diff antigen NEGATIVE NEGATIVE Final   C Diff toxin NEGATIVE NEGATIVE Final   C Diff interpretation No C. difficile detected.  Final    Comment: Performed at [facility], [address], [place], [place]         Radiology Studies: No results found.      Scheduled Meds:  chlorhexidine  15 mL Mouth Rinse BID   famotidine  20 mg Oral Daily   heparin injection (subcutaneous)  5,000 Units Subcutaneous Q8H   lip balm  1 application Topical BID   mouth rinse  15 mL Mouth  Rinse BID   melatonin  3 mg Oral QHS   potassium chloride  20 mEq Oral BID   psyllium  1 packet Oral BID   Continuous Infusions:  0.9 % NaCl with KCl 40 mEq / L 50 mL/hr at 07/08/21 0520   piperacillin-tazobactam (ZOSYN)  IV 3.375 g (07/08/21 0612)     LOS: 15 days    Time spent: 32 minutes     Dorcas Carrow, MD [facility] Pager [phone]

## 2021-07-08 NOTE — Progress Notes (Signed)
14 Days Post-Op   Subjective/Chief Complaint: Comfortable this morning No complaints   Objective: Vital signs in last 24 hours: Temp:  [98.3 F (36.8 C)-98.6 F (37 C)] 98.6 F (37 C) (07/22 2158) Pulse Rate:  [78-84] 84 (07/22 2158) Resp:  [18-20] 20 (07/22 2158) BP: (101-104)/(53-56) 101/56 (07/22 2158) SpO2:  [98 %-99 %] 98 % (07/22 2158) Weight:  [137.1 kg] 137.1 kg (07/23 0420) Last BM Date: 07/07/21  Intake/Output from previous day: 07/22 0701 - 07/23 0700 In: 1657.9 [P.O.:240; I.V.:1217.9; IV Piggyback:200] Out: 2915 [Urine:2300; Drains:15; Stool:600] Intake/Output this shift: Total I/O In: -  Out: 450 [Urine:250; Stool:200]  Exam: Awake and alert Abdomen soft, wound clean with fascial dehiscence Ostomy stable  Lab Results:  Recent Labs    07/07/21 0330 07/08/21 0253  WBC 15.9* 13.0*  HGB 10.8* 10.6*  HCT 32.4* 31.3*  PLT 308 320   BMET Recent Labs    07/07/21 0330 07/08/21 0253  NA 140 140  K 4.0 3.9  CL 118* 114*  CO2 17* 19*  GLUCOSE 95 95  BUN 9 9  CREATININE 1.03 0.93  CALCIUM 7.5* 7.6*   PT/INR No results for input(s): LABPROT, INR in the last 72 hours. ABG No results for input(s): PHART, HCO3 in the last 72 hours.  Invalid input(s): PCO2, PO2  Studies/Results: No results found.  Anti-infectives: Anti-infectives (From admission, onward)    Start     Dose/Rate Route Frequency Ordered Stop   07/06/21 1300  piperacillin-tazobactam (ZOSYN) IVPB 3.375 g        3.375 g 12.5 mL/hr over 240 Minutes Intravenous Every 8 hours 07/06/21 1235     06/24/21 2200  cefoTEtan (CEFOTAN) 2 g in sodium chloride 0.9 % 100 mL IVPB        2 g 200 mL/hr over 30 Minutes Intravenous Every 12 hours 06/24/21 1514 06/24/21 2109   06/24/21 0830  cefoTEtan (CEFOTAN) 2 g in sodium chloride 0.9 % 100 mL IVPB        2 g 200 mL/hr over 30 Minutes Intravenous On call to O.R. 06/24/21 0733 06/24/21 1117       Assessment/Plan: POD 13 s/p open sigmoid  colectomy with end colostomy for obstructing sigmoid colon mass 06/24/21 Dr. Freida Busman - Pathology consistent with diverticulitis. No evidence of malignancy  - CT 7/21 w/ rectal stump leak. Continue drain and abx - Purulent drainage around the stoma on 7/20. No abscess seen on CT. Wicked  - Midline BID WTD.  - High output from colostomy. C. Diff negative. On metamucil. Discussed with MD who recommended holding Lomotil for now. Monitor and trend output. Rober Minion, PT/OT - SNF vs CIR  LOS: 15 days   Continue wound care WBC down today Hgb stable   Abigail Miyamoto MD 07/08/2021

## 2021-07-09 DIAGNOSIS — K56609 Unspecified intestinal obstruction, unspecified as to partial versus complete obstruction: Secondary | ICD-10-CM | POA: Diagnosis not present

## 2021-07-09 LAB — CBC WITH DIFFERENTIAL/PLATELET
Abs Immature Granulocytes: 0.08 10*3/uL — ABNORMAL HIGH (ref 0.00–0.07)
Basophils Absolute: 0 10*3/uL (ref 0.0–0.1)
Basophils Relative: 0 %
Eosinophils Absolute: 0.2 10*3/uL (ref 0.0–0.5)
Eosinophils Relative: 2 %
HCT: 32.5 % — ABNORMAL LOW (ref 39.0–52.0)
Hemoglobin: 10.9 g/dL — ABNORMAL LOW (ref 13.0–17.0)
Immature Granulocytes: 1 %
Lymphocytes Relative: 7 %
Lymphs Abs: 0.9 10*3/uL (ref 0.7–4.0)
MCH: 29.3 pg (ref 26.0–34.0)
MCHC: 33.5 g/dL (ref 30.0–36.0)
MCV: 87.4 fL (ref 80.0–100.0)
Monocytes Absolute: 1.4 10*3/uL — ABNORMAL HIGH (ref 0.1–1.0)
Monocytes Relative: 11 %
Neutro Abs: 10.1 10*3/uL — ABNORMAL HIGH (ref 1.7–7.7)
Neutrophils Relative %: 79 %
Platelets: 348 10*3/uL (ref 150–400)
RBC: 3.72 MIL/uL — ABNORMAL LOW (ref 4.22–5.81)
RDW: 14.7 % (ref 11.5–15.5)
WBC: 12.8 10*3/uL — ABNORMAL HIGH (ref 4.0–10.5)
nRBC: 0 % (ref 0.0–0.2)

## 2021-07-09 LAB — PREALBUMIN: Prealbumin: 5.1 mg/dL — ABNORMAL LOW (ref 18–38)

## 2021-07-09 NOTE — Progress Notes (Signed)
15 Days Post-Op   Subjective/Chief Complaint: Comfortable this morning No complaints Reports appetite improving   Objective: Vital signs in last 24 hours: Temp:  [98 F (36.7 C)-98.8 F (37.1 C)] 98.1 F (36.7 C) (07/24 0425) Pulse Rate:  [77-86] 86 (07/24 0425) Resp:  [20] 20 (07/24 0425) BP: (94-102)/(57-81) 94/63 (07/24 0425) SpO2:  [91 %-96 %] 96 % (07/24 0425) Weight:  [137.3 kg] 137.3 kg (07/24 0429) Last BM Date: 07/08/21  Intake/Output from previous day: 07/23 0701 - 07/24 0700 In: 1990.6 [P.O.:600; I.V.:1231.3; IV Piggyback:159.3] Out: 3525 [Urine:2400; Stool:1125] Intake/Output this shift: No intake/output data recorded.  Exam: Awake and alert Abdomen obese, non-tender, open wound with fascial breakdown stable Ostomy working well Drain serous  Lab Results:  Recent Labs    07/08/21 0253 07/09/21 0718  WBC 13.0* 12.8*  HGB 10.6* 10.9*  HCT 31.3* 32.5*  PLT 320 348   BMET Recent Labs    07/07/21 0330 07/08/21 0253  NA 140 140  K 4.0 3.9  CL 118* 114*  CO2 17* 19*  GLUCOSE 95 95  BUN 9 9  CREATININE 1.03 0.93  CALCIUM 7.5* 7.6*   PT/INR No results for input(s): LABPROT, INR in the last 72 hours. ABG No results for input(s): PHART, HCO3 in the last 72 hours.  Invalid input(s): PCO2, PO2  Studies/Results: No results found.  Anti-infectives: Anti-infectives (From admission, onward)    Start     Dose/Rate Route Frequency Ordered Stop   07/06/21 1300  piperacillin-tazobactam (ZOSYN) IVPB 3.375 g        3.375 g 12.5 mL/hr over 240 Minutes Intravenous Every 8 hours 07/06/21 1235     06/24/21 2200  cefoTEtan (CEFOTAN) 2 g in sodium chloride 0.9 % 100 mL IVPB        2 g 200 mL/hr over 30 Minutes Intravenous Every 12 hours 06/24/21 1514 06/24/21 2109   06/24/21 0830  cefoTEtan (CEFOTAN) 2 g in sodium chloride 0.9 % 100 mL IVPB        2 g 200 mL/hr over 30 Minutes Intravenous On call to O.R. 06/24/21 0733 06/24/21 1117        Assessment/Plan: s/p open sigmoid colectomy with end colostomy for obstructing sigmoid colon mass 06/24/21 Dr. Freida Busman - Pathology consistent with diverticulitis. No evidence of malignancy  - CT 7/21 w/ rectal stump leak. Continue drain and abx.  WBC slowly coming down - Purulent drainage around the stoma on 7/20. No abscess seen on CT. stable - Midline BID WTD.  - High output from colostomy. C. Diff negative. On metamucil. May need to try imodium. Will continue to follow output. Rober Minion, PT/OT - SNF vs CIR -repeat CT if WBC starts increasing  Abigail Miyamoto MD 07/09/2021

## 2021-07-09 NOTE — Progress Notes (Signed)
PROGRESS NOTE    Travis Calderon  ZOX:[id number] DOB: [birth year] DOA: 06/23/2021 PCP: [facility]    Brief Narrative:  Patient is 65 year old gentleman with history of obesity, diverticulosis presented to the emergency room with complaints of abdominal pain and abnormal bowel movements.  He was having symptoms for last few days with distention and nausea.  Patient also complained of chronic diarrhea.  In the emergency room he was found to be positive for COVID-19, CT scan abdomen pelvis showed large obstructing colonic mass.  Admitted to hospital and underwent colectomy with colostomy.  Hospital course complicated with ATN.  Stabilizing now. 7/21, suspected stump dehiscence and remains on antibiotics.  Clinically stabilizing.   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 Treated with IV antibiotics and fluid. Status post sigmoid colectomy and end colostomy on 7/9.  Postoperative course complicated by ileus and now resolved. Followed by surgery, persistent elevated white blood cell count, repeat CT scan and JP drain with possible stump leak.  7/21, restarted on antibiotics with Zosyn.  Anticipating conservative management. Currently tolerating regular diet.  Advance activities. Pathology consistent with diverticulitis, no evidence of malignancy.  COVID-19 viral infection: Mostly asymptomatic.  No evidence of pneumonia.  Isolation precaution discontinued.  Acute kidney injury: Prerenal injury.  Now with  improved appetite.  Renal functions normalized. Remains on maintenance IV fluids with potassium.  Continue today. Recheck tomorrow morning.  Electrolyte abnormalities: Mostly improved.  Will check potassium magnesium and phosphorus with morning labs.  Continue to work with PT OT.  Anticipate to CIR on discharge when surgically ready.    DVT prophylaxis: heparin injection 5,000 Units Start: 06/26/21 0900 SCDs Start: 06/23/21 1845   Code Status: Full code Family Communication: None today. Disposition Plan: Status is: Inpatient  Remains inpatient appropriate because:Unsafe d/c plan  Dispo: The patient is from: Home              Anticipated d/c is to: CIR vs snf              Patient currently is not medically stable.  Needs surgical stability before discharge.   Difficult to place patient No         Consultants:  General surgery  Procedures:  Hartmann procedure 7/9  Antimicrobials:  Completed antibiotics. Zosyn 7/21----   Subjective: Patient seen and examined.  Denies any complaints.  Anticipating to walk today more in the hallway.  Objective: Vitals:   07/08/21 1317 07/08/21 2004 07/09/21 0425 07/09/21 0429  BP: (!) 96/57 102/81 94/63   Pulse: 77 84 86   Resp: 20 20 20    Temp: 98.8 F (37.1 C) 98 F (36.7 C) 98.1 F (36.7 C)   TempSrc: Oral Oral Oral   SpO2: 96% 91% 96%   Weight:    (!) 137.3 kg  Height:    5\' 7"  (1.702 m)    Intake/Output Summary (Last 24 hours) at 07/09/2021 1056 Last data filed at 07/09/2021 0854 Gross per 24 hour  Intake 1870.58 ml  Output 3050 ml  Net -1179.42 ml  Filed Weights   07/07/21 0459 07/08/21 0420 07/09/21 0429  Weight: (!) 139.2 kg (!) 137.1 kg (!) 137.3 kg    Examination:  Looks comfortable. Abdomen is soft and nontender.  Midline incision and colostomy present.  Functioning well. JP drain with no drainage in the bulb, mild yellow drainage on the tube. No edema.      Data Reviewed: I have personally  reviewed following labs and imaging studies  CBC: Recent Labs  Lab 07/03/21 0332 07/04/21 0745 07/05/21 0849 07/07/21 0330 07/08/21 0253 07/09/21 0718  WBC 16.2* 16.0* 15.3* 15.9* 13.0* 12.8*  NEUTROABS 13.5*  --   --  13.0*  --  10.1*  HGB 11.8* 11.5* 10.8* 10.8* 10.6* 10.9*  HCT 35.4* 34.2* 32.8* 32.4* 31.3* 32.5*  MCV 88.1 87.0 89.1 89.5 86.9 87.4  PLT 220 210 249 308 320 348   Basic Metabolic Panel: Recent Labs  Lab 07/04/21 0745 07/05/21 0849 07/06/21 0341 07/07/21 0330 07/08/21 0253  NA 143 143 142 140 140  K 3.7 3.9 3.9 4.0 3.9  CL 118* 117* 115* 118* 114*  CO2 20* 21* 19* 17* 19*  GLUCOSE 103* 98 91 95 95  BUN 14 10 9 9 9   CREATININE 0.84 0.80 0.79 1.03 0.93  CALCIUM 7.9* 7.7* 7.8* 7.5* 7.6*   GFR: Estimated Creatinine Clearance: 106 mL/min (by C-G formula based on SCr of 0.93 mg/dL). Liver Function Tests: No results for input(s): AST, ALT, ALKPHOS, BILITOT, PROT, ALBUMIN in the last 168 hours. No results for input(s): LIPASE, AMYLASE in the last 168 hours. No results for input(s): AMMONIA in the last 168 hours. Coagulation Profile: No results for input(s): INR, PROTIME in the last 168 hours. Cardiac Enzymes: No results for input(s): CKTOTAL, CKMB, CKMBINDEX, TROPONINI in the last 168 hours. BNP (last 3 results) No results for input(s): PROBNP in the last 8760 hours. HbA1C: No results for input(s): HGBA1C in the last 72 hours. CBG: No results for input(s): GLUCAP in the last 168 hours.  Lipid Profile: No results for input(s): CHOL, HDL, LDLCALC, TRIG, CHOLHDL, LDLDIRECT in the last 72 hours. Thyroid Function Tests: No results for input(s): TSH, T4TOTAL, FREET4, T3FREE, THYROIDAB in the last 72 hours. Anemia Panel: No results for input(s): VITAMINB12, FOLATE, FERRITIN, TIBC, IRON, RETICCTPCT in the last 72 hours. Sepsis Labs: No results for input(s): PROCALCITON, LATICACIDVEN in the last 168 hours.  Recent Results (from the past 240 hour(s))  C  Difficile Quick Screen (NO PCR Reflex)     Status: None   Collection Time: 06/30/21 11:53 AM   Specimen: STOOL  Result Value Ref Range Status   C Diff antigen NEGATIVE NEGATIVE Final   C Diff toxin NEGATIVE NEGATIVE Final   C Diff interpretation No C. difficile detected.  Final    Comment: Performed at [facility], [address], [place], [place]         Radiology Studies: No results found.      Scheduled Meds:  chlorhexidine  15 mL Mouth Rinse BID   famotidine  20 mg Oral Daily   heparin injection (subcutaneous)  5,000 Units Subcutaneous Q8H   lip balm  1 application Topical BID   mouth rinse  15 mL Mouth Rinse BID   melatonin  3 mg Oral QHS   potassium chloride  20 mEq Oral BID   psyllium  1 packet Oral BID   Continuous Infusions:  0.9 % NaCl with KCl 40 mEq / L 50 mL/hr at 07/09/21 1005  piperacillin-tazobactam (ZOSYN)  IV 3.375 g (07/09/21 0509)     LOS: 16 days    Time spent: 22 minutes    Dorcas Carrow, MD [facility] Pager [phone]

## 2021-07-09 NOTE — Progress Notes (Signed)
Physical Therapy Treatment Patient Details Name: Travis Calderon MRN: [id number] DOB: 05-Feb-1956 Today's Date: 07/09/2021    History of Present Illness This 65 year old male with PMH significant for obesity, diverticulosis presented to the ED 06/23/21 with complaints of abdominal pain and abnormal bowel movements.He is found to be COVID-positive, CT abdomen shows large obstructing colonic mass.  Patient underwent Open sigmoid colectomy with end colostomy.on 06/24/21.Incisional wound VAC given massive distention, shock, contamination.  positive for covid    PT Comments    Pt progressing well. Incr activity/gait tolerance. Amb with nursing in addition to PT today. Will continue to follow in acute setting. D/c plan updated.   Follow Up Recommendations  Home health PT;SNF (vs pending progress, CIR declined pt)     Equipment Recommendations  Rolling walker with 5" wheels (wide)    Recommendations for Other Services       Precautions / Restrictions Precautions Precautions: Fall Precaution Comments: colostomy,  right JP drain, urinary urgency,, large abdominal  wound/dressing Restrictions Weight Bearing Restrictions: No    Mobility  Bed Mobility               General bed mobility comments: in recliner    Transfers Overall transfer level: Needs assistance Equipment used: Rolling walker (2 wheeled) Transfers: Sit to/from Stand Sit to Stand: Supervision         General transfer comment: cues for hand placement, cues to control descent  Ambulation/Gait Ambulation/Gait assistance: Min guard;Supervision Gait Distance (Feet): 170 Feet Assistive device: Rolling walker (2 wheeled) Gait Pattern/deviations: Step-through pattern Gait velocity: decr   General Gait Details: improved gait velocity, occasional cues for posture, good stability and able to maneuver RW for turns, min/guard to supervision for safety   Stairs             Wheelchair Mobility    Modified  Rankin (Stroke Patients Only)       Balance   Sitting-balance support: Feet supported Sitting balance-Leahy Scale: Good     Standing balance support: Single extremity supported;Bilateral upper extremity supported Standing balance-Leahy Scale: Fair                              Cognition Arousal/Alertness: Awake/alert Behavior During Therapy: WFL for tasks assessed/performed Overall Cognitive Status: Within Functional Limits for tasks assessed                                        Exercises      General Comments        Pertinent Vitals/Pain Pain Assessment: No/denies pain    Home Living                      Prior Function            PT Goals (current goals can now be found in the care plan section) Acute Rehab PT Goals Patient Stated Goal: return to independence PT Goal Formulation: With patient Time For Goal Achievement: 07/17/21 Potential to Achieve Goals: Good Progress towards PT goals: Progressing toward goals    Frequency    Min 3X/week      PT Plan Frequency needs to be updated;Discharge plan needs to be updated    Co-evaluation              AM-PAC PT "6 Clicks" Mobility   Outcome Measure  Help needed turning from your back to your side while in a flat bed without using bedrails?: A Little Help needed moving from lying on your back to sitting on the side of a flat bed without using bedrails?: A Little Help needed moving to and from a bed to a chair (including a wheelchair)?: A Little Help needed standing up from a chair using your arms (e.g., wheelchair or bedside chair)?: A Little Help needed to walk in hospital room?: A Little Help needed climbing 3-5 steps with a railing? : A Lot 6 Click Score: 17    End of Session   Activity Tolerance: Patient tolerated treatment well Patient left: in chair;with call bell/phone within reach (no alarm on arrival) Nurse Communication: Mobility status PT Visit  Diagnosis: Unsteadiness on feet (R26.81);Difficulty in walking, not elsewhere classified (R26.2)     Time: 9201-0071 PT Time Calculation (min) (ACUTE ONLY): 24 min  Charges:  $Gait Training: 23-37 mins                     Delice Bison, PT  Acute Rehab Dept ([facility]) [phone] Pager [phone]  07/09/2021    [facility] 07/09/2021, 11:29 AM

## 2021-07-10 DIAGNOSIS — K56609 Unspecified intestinal obstruction, unspecified as to partial versus complete obstruction: Secondary | ICD-10-CM | POA: Diagnosis not present

## 2021-07-10 LAB — BASIC METABOLIC PANEL
Anion gap: 5 (ref 5–15)
BUN: 9 mg/dL (ref 8–23)
CO2: 20 mmol/L — ABNORMAL LOW (ref 22–32)
Calcium: 7.4 mg/dL — ABNORMAL LOW (ref 8.9–10.3)
Chloride: 112 mmol/L — ABNORMAL HIGH (ref 98–111)
Creatinine, Ser: 0.94 mg/dL (ref 0.61–1.24)
GFR, Estimated: >60 mL/min (ref >60)
Glucose, Bld: 95 mg/dL (ref 70–99)
Potassium: 3.9 mmol/L (ref 3.5–5.1)
Sodium: 137 mmol/L (ref 135–145)

## 2021-07-10 LAB — CBC WITH DIFFERENTIAL/PLATELET
Abs Immature Granulocytes: 0.15 10*3/uL — ABNORMAL HIGH (ref 0.00–0.07)
Basophils Absolute: 0 10*3/uL (ref 0.0–0.1)
Basophils Relative: 0 %
Eosinophils Absolute: 0.3 10*3/uL (ref 0.0–0.5)
Eosinophils Relative: 3 %
HCT: 29.5 % — ABNORMAL LOW (ref 39.0–52.0)
Hemoglobin: 10 g/dL — ABNORMAL LOW (ref 13.0–17.0)
Immature Granulocytes: 1 %
Lymphocytes Relative: 9 %
Lymphs Abs: 1 10*3/uL (ref 0.7–4.0)
MCH: 29.9 pg (ref 26.0–34.0)
MCHC: 33.9 g/dL (ref 30.0–36.0)
MCV: 88.1 fL (ref 80.0–100.0)
Monocytes Absolute: 1.2 10*3/uL — ABNORMAL HIGH (ref 0.1–1.0)
Monocytes Relative: 10 %
Neutro Abs: 8.7 10*3/uL — ABNORMAL HIGH (ref 1.7–7.7)
Neutrophils Relative %: 77 %
Platelets: 322 10*3/uL (ref 150–400)
RBC: 3.35 MIL/uL — ABNORMAL LOW (ref 4.22–5.81)
RDW: 14.6 % (ref 11.5–15.5)
WBC: 11.3 10*3/uL — ABNORMAL HIGH (ref 4.0–10.5)
nRBC: 0 % (ref 0.0–0.2)

## 2021-07-10 LAB — PHOSPHORUS: Phosphorus: 3.3 mg/dL (ref 2.5–4.6)

## 2021-07-10 LAB — MAGNESIUM: Magnesium: 1.4 mg/dL — ABNORMAL LOW (ref 1.7–2.4)

## 2021-07-10 MED ORDER — MAGNESIUM SULFATE 2 GM/50ML IV SOLN
2.0000 g | Freq: Once | INTRAVENOUS | Status: AC
  Administered 2021-07-10: 2 g via INTRAVENOUS
  Filled 2021-07-10: qty 50

## 2021-07-10 MED ORDER — MAGNESIUM OXIDE -MG SUPPLEMENT 400 (240 MG) MG PO TABS
400.0000 mg | ORAL_TABLET | Freq: Two times a day (BID) | ORAL | Status: DC
  Administered 2021-07-10 – 2021-07-19 (×19): 400 mg via ORAL
  Filled 2021-07-10 (×19): qty 1

## 2021-07-10 NOTE — Progress Notes (Signed)
Occupational Therapy Treatment Patient Details Name: Travis Calderon MRN: [id number] DOB: 1955/12/27 Today's Date: 07/10/2021    History of present illness This 65 year old male with PMH significant for obesity, diverticulosis presented to the ED 06/23/21 with complaints of abdominal pain and abnormal bowel movements.He is found to be COVID-positive, CT abdomen shows large obstructing colonic mass.  Patient underwent Open sigmoid colectomy with end colostomy.on 06/24/21.Incisional wound VAC given massive distention, shock, contamination.  positive for covid   OT comments  Patient was seated in recliner in room for session. patient completed LB dressing with sock aid with SUP. patient donned pants in room up to knees with education on how to use reacher for LB dressing tasks. patient reported having sock aid at home that was broken. patient was provided with education for total hip kit. patient verbalized understanding. Patient was educated on energy conservation techniques. Patient verbalized understanding. Patient was provided with hand out on energy conservation techniques and adaptive equipment. Patient reported desire to go to rehab after d/c from hospital to continue to get stronger. Patients d/c plans remain appropriate at this time.   Follow Up Recommendations  CIR    Equipment Recommendations  Other (comment) (bariatric 3 in 1)    Recommendations for Other Services      Precautions / Restrictions Precautions Precautions: Fall Precaution Comments: colostomy,  right JP drain, urinary urgency,, large abdominal  wound/dressing       Mobility Bed Mobility                    Transfers                      Balance                                           ADL either performed or assessed with clinical judgement   ADL                         Lower Body Dressing Details (indicate cue type and reason): patient completed LB dressing with  sock aid with SUP. patient donned pants in room up to knees with education on how to use reacher for LB dressing tasks. patient reported having sock aid at home that was broken. patient was provided with education for total hip kit. patient verbalized understanding.                     Vision       Perception     Praxis      Cognition Arousal/Alertness: Awake/alert Behavior During Therapy: WFL for tasks assessed/performed Overall Cognitive Status: Within Functional Limits for tasks assessed                                          Exercises     Shoulder Instructions       General Comments      Pertinent Vitals/ Pain       Faces Pain Scale: Hurts little more Pain Location: abdomen Pain Descriptors / Indicators: Grimacing;Guarding;Sore Pain Intervention(s): Limited activity within patient's tolerance;Monitored during session  Home Living  Prior Functioning/Environment              Frequency  Min 2X/week        Progress Toward Goals  OT Goals(current goals can now be found in the care plan section)  Progress towards OT goals: Progressing toward goals  Acute Rehab OT Goals Patient Stated Goal: return to independence OT Goal Formulation: With patient/family Time For Goal Achievement: 07/12/21 Potential to Achieve Goals: Good  Plan Discharge plan remains appropriate    Co-evaluation                 AM-PAC OT "6 Clicks" Daily Activity     Outcome Measure   Help from another person eating meals?: None Help from another person taking care of personal grooming?: A Little Help from another person toileting, which includes using toliet, bedpan, or urinal?: A Little Help from another person bathing (including washing, rinsing, drying)?: A Little Help from another person to put on and taking off regular upper body clothing?: A Little Help from another person to put on and  taking off regular lower body clothing?: A Little 6 Click Score: 19    End of Session    OT Visit Diagnosis: Other abnormalities of gait and mobility (R26.89);Pain Pain - part of body:  (abdomen)   Activity Tolerance Patient tolerated treatment well   Patient Left in chair;with call bell/phone within reach   Nurse Communication          Time: 0737-1062 OT Time Calculation (min): 21 min  Charges: OT General Charges $OT Visit: 1 Visit OT Treatments $Self Care/Home Management : 8-22 mins  Jackelyn Poling OTR/L, [place] Acute Rehabilitation Department Office# [phone] Pager# [phone]    [place] 07/10/2021, 3:31 PM

## 2021-07-10 NOTE — Progress Notes (Signed)
PROGRESS NOTE    Travis Calderon  YQM:[id number] DOB: May 03, 1956 DOA: 06/23/2021 PCP: [facility]    Brief Narrative:  No medical history.  Admitted with abdominal pain found to have sigmoid diverticulitis rupture, status post Hartmann procedure. Hospital course complicated by ongoing surgical issues.   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  Continue to mobilize. Out of isolation for COVID-19.  Plan: All plans as directed by surgery. Will help with discharge when cleared by surgery. Replace electrolytes today.  Magnesium IV and we will keep on a scheduled replacement.  Already on potassium replacement.  DVT prophylaxis: heparin injection 5,000 Units Start: 06/26/21 0900 SCDs Start: 06/23/21 1845   Code Status: Full code Family Communication: None Disposition Plan: Status is: Inpatient  Remains inpatient appropriate because:Inpatient level of care appropriate due to severity of illness  Dispo: The patient is from: Home              Anticipated d/c is to: SNF              Patient currently is not medically stable to d/c.   Difficult to place patient No         Consultants:  General surgery  Procedures:  Gertie Gowda procedure and end colostomy 7/9  Antimicrobials:  Zosyn 7/20---   Subjective: Patient seen and examined.  No new events.  Surgery following.  Objective: Vitals:   07/09/21 1342 07/09/21 2031 07/10/21 0410 07/10/21 0451  BP: (!) 111/50  91/61 93/60   Pulse: 87 88 85   Resp: 20 20 20    Temp: 98.6 F (37 C) 99.9 F (37.7 C) 98.8 F (37.1 C)   TempSrc: Oral Oral Oral   SpO2: 99% 98% 97%   Weight:    135.1 kg  Height:        Intake/Output Summary (Last 24 hours) at 07/10/2021 1350 Last data filed at 07/10/2021 1200 Gross per 24 hour  Intake 2269.86 ml  Output 3112.5 ml  Net -842.64 ml   Filed Weights   07/08/21 0420 07/09/21 0429 07/10/21 0451  Weight: (!) 137.1 kg (!) 137.3 kg 135.1 kg    Examination: Patient looks comfortable.  Was eating breakfast. Abdomen exam: Obese and pendulous.  Colostomy with loose brown stool.  Midline incision, pictures in the chart. JP drain right lower quadrant with minimal yellow secretions.    Data Reviewed: I have personally reviewed following labs and imaging studies  CBC: Recent Labs  Lab 07/05/21 0849 07/07/21 0330 07/08/21 0253 07/09/21 0718 07/10/21 0328  WBC 15.3* 15.9* 13.0* 12.8* 11.3*  NEUTROABS  --  13.0*  --  10.1* 8.7*  HGB 10.8* 10.8* 10.6* 10.9* 10.0*  HCT 32.8* 32.4* 31.3* 32.5* 29.5*  MCV 89.1 89.5 86.9 87.4 88.1  PLT 249 308 320 348 322   Basic Metabolic Panel: Recent Labs  Lab 07/05/21 0849 07/06/21 0341 07/07/21 0330 07/08/21 0253 07/10/21 0328  NA 143 142 140 140 137  K 3.9 3.9 4.0 3.9 3.9  CL 117* 115* 118* 114* 112*  CO2 21* 19* 17* 19* 20*  GLUCOSE 98 91 95 95 95  BUN 10 9 9 9 9   CREATININE 0.80 0.79 1.03 0.93 0.94  CALCIUM 7.7* 7.8* 7.5* 7.6* 7.4*  MG  --   --   --   --  1.4*  PHOS  --   --   --   --  3.3   GFR: Estimated Creatinine Clearance: 103.8 mL/min (by C-G formula based on SCr of 0.94 mg/dL). Liver Function Tests: No results for input(s): AST, ALT, ALKPHOS, BILITOT, PROT, ALBUMIN in the last 168 hours. No results for input(s): LIPASE, AMYLASE in the last 168 hours. No results for input(s): AMMONIA in the last 168 hours. Coagulation Profile: No results for input(s): INR, PROTIME in the last 168 hours. Cardiac  Enzymes: No results for input(s): CKTOTAL, CKMB, CKMBINDEX, TROPONINI in the last 168 hours. BNP (last 3 results) No results for input(s): PROBNP in the last 8760 hours. HbA1C: No results for input(s): HGBA1C in the last 72 hours. CBG: No results for input(s): GLUCAP in the last 168 hours. Lipid Profile: No results for input(s): CHOL, HDL, LDLCALC, TRIG, CHOLHDL, LDLDIRECT in the last 72 hours. Thyroid Function Tests: No results for input(s): TSH, T4TOTAL, FREET4, T3FREE, THYROIDAB in the last 72 hours. Anemia Panel: No results for input(s): VITAMINB12, FOLATE, FERRITIN, TIBC, IRON, RETICCTPCT in the last 72 hours. Sepsis Labs: No results for input(s): PROCALCITON, LATICACIDVEN in the last 168 hours.  No results found for this or any previous visit (from the past 240 hour(s)).       Radiology Studies: No results found.      Scheduled Meds:  chlorhexidine  15 mL Mouth Rinse BID   famotidine  20 mg Oral Daily   heparin injection (subcutaneous)  5,000 Units Subcutaneous Q8H   lip balm  1 application Topical BID   magnesium oxide  400 mg Oral BID   mouth rinse  15 mL Mouth Rinse BID   melatonin  3 mg Oral QHS   potassium chloride  20 mEq Oral BID   psyllium  1 packet Oral BID   Continuous Infusions:  piperacillin-tazobactam (ZOSYN)  IV 3.375 g (07/10/21 1344)     LOS: 17 days    Time spent: 20 minutes    07/12/21, MD [facility] Pager [phone]

## 2021-07-10 NOTE — Progress Notes (Addendum)
16 Days Post-Op  Subjective: CC: Doing well.  Only has some soreness in the right side of his abdomen.  Tolerating diet without any nausea or vomiting.  Having colostomy output.  Still high output.  Working with therapies.  Voiding.  Objective: Vital signs in last 24 hours: Temp:  [98.6 F (37 C)-99.9 F (37.7 C)] 98.8 F (37.1 C) (07/25 0410) Pulse Rate:  [85-88] 85 (07/25 0410) Resp:  [20] 20 (07/25 0410) BP: (91-111)/(50-61) 93/60 (07/25 0410) SpO2:  [97 %-99 %] 97 % (07/25 0410) Weight:  [135.1 kg] 135.1 kg (07/25 0451) Last BM Date: 07/10/21  Intake/Output from previous day: 07/24 0701 - 07/25 0700 In: 2262.6 [P.O.:840; I.V.:1245.7; IV Piggyback:177] Out: 3835 [Urine:2750; Drains:10; Stool:1075] Intake/Output this shift: Total I/O In: 440.3 [P.O.:300; I.V.:108.3; IV Piggyback:32] Out: 352.5 [Urine:200; Drains:2.5; Stool:150]  PE: Gen:  Alert, NAD, pleasant Pulm: normal rate and effort  Abd: Soft, mild distension, stable tenderness on the right side of abdomen without peritonitis. +BS. Midline wound with dehiscence without evisceration. There is granulation tissue on wound edges and base. Cleaning up well. There is 4cm of tracking inferior portion and it appears the fascia is intact w/ palpation. There is ~4cm of tracking superior aspect of the wound. Colostomy with semisolid/liquid stool in bag. Stoma pink, budded and viable. There is mucocutaneous separation at the inferior portion of the stoma w/ purulent drainage that is able to be expressed from the wound (see picture below). Fascia appears intact when probed with sterile cotton swab below. The area was wicked. JP drain with thin brown output this morning.  Psych: A&Ox3 Skin: no rashes noted, warm and dry        Lab Results:  Recent Labs    07/09/21 0718 07/10/21 0328  WBC 12.8* 11.3*  HGB 10.9* 10.0*  HCT 32.5* 29.5*  PLT 348 322   BMET Recent Labs    07/08/21 0253 07/10/21 0328  NA 140 137  K  3.9 3.9  CL 114* 112*  CO2 19* 20*  GLUCOSE 95 95  BUN 9 9  CREATININE 0.93 0.94  CALCIUM 7.6* 7.4*   PT/INR No results for input(s): LABPROT, INR in the last 72 hours. CMP     Component Value Date/Time   NA 137 07/10/2021 0328   K 3.9 07/10/2021 0328   CL 112 (H) 07/10/2021 0328   CO2 20 (L) 07/10/2021 0328   GLUCOSE 95 07/10/2021 0328   BUN 9 07/10/2021 0328   CREATININE 0.94 07/10/2021 0328   CALCIUM 7.4 (L) 07/10/2021 0328   PROT 7.4 06/24/2021 0247   ALBUMIN 3.1 (L) 06/24/2021 0247   AST 12 (L) 06/24/2021 0247   ALT 11 06/24/2021 0247   ALKPHOS 69 06/24/2021 0247   BILITOT 1.3 (H) 06/24/2021 0247   GFRNONAA >60 07/10/2021 0328   Lipase     Component Value Date/Time   LIPASE 22 06/23/2021 1109    Studies/Results: No results found.  Anti-infectives: Anti-infectives (From admission, onward)    Start     Dose/Rate Route Frequency Ordered Stop   07/06/21 1300  piperacillin-tazobactam (ZOSYN) IVPB 3.375 g        3.375 g 12.5 mL/hr over 240 Minutes Intravenous Every 8 hours 07/06/21 1235     06/24/21 2200  cefoTEtan (CEFOTAN) 2 g in sodium chloride 0.9 % 100 mL IVPB        2 g 200 mL/hr over 30 Minutes Intravenous Every 12 hours 06/24/21 1514 06/24/21 2109   06/24/21 0830  cefoTEtan (CEFOTAN) 2 g in sodium chloride 0.9 % 100 mL IVPB        2 g 200 mL/hr over 30 Minutes Intravenous On call to O.R. 06/24/21 0733 06/24/21 1117        Assessment/Plan POD 16 s/p open sigmoid colectomy with end colostomy for obstructing sigmoid colon mass 06/24/21 Dr. Freida Busman - Pathology consistent with diverticulitis. No evidence of malignancy  - CT 7/21 w/ rectal stump leak. Continue drain and abx - Purulent drainage around the stoma on 7/20. No abscess seen on CT. Wicked  - Midline BID WTD. Consider VAC. Will discuss with MD. - High output from colostomy. C. Diff negative. On metamucil. Will discuss with MD if he would like to add imodium - Mobilize, PT/OT -  - No plan for  further surgery. Nearing discharge - SNF vs CIR    FEN: CM. IVF per [facility] VTE: SCDs, SQH ID: cefotetan x2 doses. Zosyn 7/21 >>  Foley - External    ABL anemia - hgb 10 COVID + - per TR[facility] Morbid Obesity - BMI 49.89 BPH    LOS: 17 days    Jacinto Halim , [facility] Surgery 07/10/2021, 11:59 AM Please see Amion for pager number during day hours 7:00am-4:30pm

## 2021-07-10 NOTE — Consult Note (Signed)
WOC Nurse ostomy follow up Stoma type/location: LLQ, end colostomy  Stomal assessment/size: oval shaped; 1 5/8 x 2", pink, slightly budded Peristomal assessment: MC separation with wound that is 4 cm deep at 5 o'clock  Treatment options for stomal/peristomal skin: packed open wound with strip of silver hydrofiber; using 2" skin barrier ring over this area along with around the stoma  Output liquid yellow with some chunks  Ostomy pouching: 1pc./2pc.  Education provided: sister that has watched pouch change in the room.   Pouch changed with CCS PA at the bedside to assist, images taken. Continues to have yellow/white exudate leaking from wound (MC separation) leaks more with palpation; known to have collection of drainage in this area.  JP drainage is same consistency and color.   Enrolled patient in [employer] Discharge program: Yes, previously   Discussed midline full thickness open wound, fascial defect that is stable per CCS notes, may restart NPWT this week on Wednesday.   Supplies in the room for pouch change and NPWT re initiation.  WOC Nurse will follow along with you for continued support with ostomy teaching, care, and assistance as needed with wound care.  Travis Calderon [facility] MSN, RN, [place], CNS, [place] [phone]

## 2021-07-11 DIAGNOSIS — K56609 Unspecified intestinal obstruction, unspecified as to partial versus complete obstruction: Secondary | ICD-10-CM | POA: Diagnosis not present

## 2021-07-11 LAB — CBC WITH DIFFERENTIAL/PLATELET
Abs Immature Granulocytes: 0.08 10*3/uL — ABNORMAL HIGH (ref 0.00–0.07)
Basophils Absolute: 0 10*3/uL (ref 0.0–0.1)
Basophils Relative: 0 %
Eosinophils Absolute: 0.4 10*3/uL (ref 0.0–0.5)
Eosinophils Relative: 4 %
HCT: 30.5 % — ABNORMAL LOW (ref 39.0–52.0)
Hemoglobin: 10.2 g/dL — ABNORMAL LOW (ref 13.0–17.0)
Immature Granulocytes: 1 %
Lymphocytes Relative: 10 %
Lymphs Abs: 1 10*3/uL (ref 0.7–4.0)
MCH: 29.2 pg (ref 26.0–34.0)
MCHC: 33.4 g/dL (ref 30.0–36.0)
MCV: 87.4 fL (ref 80.0–100.0)
Monocytes Absolute: 1.1 10*3/uL — ABNORMAL HIGH (ref 0.1–1.0)
Monocytes Relative: 11 %
Neutro Abs: 7.5 10*3/uL (ref 1.7–7.7)
Neutrophils Relative %: 74 %
Platelets: 313 10*3/uL (ref 150–400)
RBC: 3.49 MIL/uL — ABNORMAL LOW (ref 4.22–5.81)
RDW: 14.7 % (ref 11.5–15.5)
WBC: 10.1 10*3/uL (ref 4.0–10.5)
nRBC: 0 % (ref 0.0–0.2)

## 2021-07-11 MED ORDER — SODIUM CHLORIDE 0.9 % IV SOLN
INTRAVENOUS | Status: DC | PRN
  Administered 2021-07-11: 250 mL via INTRAVENOUS

## 2021-07-11 NOTE — Progress Notes (Signed)
PROGRESS NOTE    Travis Calderon  EXB:[id number] DOB: [birth year] DOA: 06/23/2021 [facility]CP: [facility]    Brief Narrative:  Patient with no medical history.  Admitted with abdominal pain found to have diverticulitis ruptured status post Hartmann procedure.  Hospital course complicated by acute kidney injury and electrolyte problems that is resolved now.   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  Proved to have ruptured diverticulitis. Currently conservative management for suspected rectal stump leak. COVID-19, asymptomatic out of isolation.  18th day in the hospital now.  Plan: Wound care, drain, wound VAC and colostomy care as per surgery. Electrolyte aggressively replaced.  On a scheduled potassium and magnesium. Will be available to continue to help with discharge planning.  Discussed with surgical attending.  We will transfer care to surgery as primary.  I will sign off.  We will be available to help with any medical issues.  In that case, please reconsult hospitalist service.     DVT prophylaxis: heparin injection 5,000 Units Start: 06/26/21 0900 SCDs Start: 06/23/21 1845   Code Status: Full code Family Communication: None Disposition Plan: Status is: Inpatient  Remains inpatient appropriate because:Inpatient level of care appropriate due to severity of illness  Dispo: The patient is from: Home              Anticipated d/c is to: SNF              Patient currently is not medically stable to d/c. Surgery  deciding.   Difficult to place patient No   Consultants:  General surgery  Procedures:  Hartmann procedure 7/9  Antimicrobials:  Supported on 7/9 Zosyn 7/20---   Subjective: Patient seen and examined.  Afebrile.  Denies any complaints.  Had some mild shortness lower abdomen but controlled.  Motivated to walk every day. He was asking me about good timing for hip replacement for osteoarthritis, advised to follow-up with orthopedics at least after completing a good physical therapy rehab and recover from all open wounds.  Objective: Vitals:   07/10/21 0451 07/10/21 2101 07/11/21 0422 07/11/21 0500  BP:  (!) 91/59 93/63   Pulse:  83 77   Resp:  20 20   Temp:  98.7 F (37.1 C) 98.2 F (36.8 C)   TempSrc:  Oral Oral   SpO2:  97% 96%   Weight: 135.1 kg   134.2 kg  Height:        Intake/Output Summary (Last 24 hours) at 07/11/2021 1143 Last data filed at 07/11/2021 0900 Gross per 24 hour  Intake 696.96 ml  Output 2707.5 ml  Net -2010.54 ml   Filed Weights   07/09/21 0429 07/10/21 0451 07/11/21 0500  Weight: (!) 137.3 kg 135.1 kg 134.2 kg    Examination: General: He looks comfortable.  Laying in bed.  Not in any distress. Cardiovascular: S1-S2 normal. Respiratory: Bilateral clear. Gastrointestinal: Soft.  Pendulous and obese.  Midline surgical incision open picture in the chart.  Left lower quadrant colostomy with loose stool.  JP tube with minimal yellow slough. Ext: No swelling  or edema.   Data Reviewed: I have personally reviewed following labs and imaging studies  CBC: Recent Labs  Lab 07/07/21 0330 07/08/21 0253 07/09/21 0718 07/10/21 0328 07/11/21 0400  WBC 15.9* 13.0* 12.8* 11.3* 10.1  NEUTROABS 13.0*  --  10.1* 8.7* 7.5  HGB 10.8* 10.6* 10.9* 10.0* 10.2*  HCT 32.4* 31.3* 32.5* 29.5* 30.5*  MCV 89.5 86.9 87.4 88.1 87.4  PLT 308 320 348 322 313   Basic Metabolic Panel: Recent Labs  Lab 07/05/21 0849 07/06/21 0341 07/07/21 0330 07/08/21 0253  07/10/21 0328  NA 143 142 140 140 137  K 3.9 3.9 4.0 3.9 3.9  CL 117* 115* 118* 114* 112*  CO2 21* 19* 17* 19* 20*  GLUCOSE 98 91 95 95 95  BUN 10 9 9 9 9   CREATININE 0.80 0.79 1.03 0.93 0.94  CALCIUM 7.7* 7.8* 7.5* 7.6* 7.4*  MG  --   --   --   --  1.4*  PHOS  --   --   --   --  3.3   GFR: Estimated Creatinine Clearance: 103.4 mL/min (by C-G formula based on SCr of 0.94 mg/dL). Liver Function Tests: No results for input(s): AST, ALT, ALKPHOS, BILITOT, PROT, ALBUMIN in the last 168 hours. No results for input(s): LIPASE, AMYLASE in the last 168 hours. No results for input(s): AMMONIA in the last 168 hours. Coagulation Profile: No results for input(s): INR, PROTIME in the last 168 hours. Cardiac Enzymes: No results for input(s): CKTOTAL, CKMB, CKMBINDEX, TROPONINI in the last 168 hours. BNP (last 3 results) No results for input(s): PROBNP in the last 8760 hours. HbA1C: No results for input(s): HGBA1C in the last 72 hours. CBG: No results for input(s): GLUCAP in the last 168 hours. Lipid Profile: No results for input(s): CHOL, HDL, LDLCALC, TRIG, CHOLHDL, LDLDIRECT in the last 72 hours. Thyroid Function Tests: No results for input(s): TSH, T4TOTAL, FREET4, T3FREE, THYROIDAB in the last 72 hours. Anemia Panel: No results for input(s): VITAMINB12, FOLATE, FERRITIN, TIBC, IRON, RETICCTPCT in the last 72 hours. Sepsis Labs: No results for input(s): PROCALCITON, LATICACIDVEN in the last 168 hours.  No results found for this or any previous visit (from the past 240 hour(s)).       Radiology Studies: No results found.      Scheduled Meds:  chlorhexidine  15 mL Mouth Rinse BID   famotidine  20 mg Oral Daily   heparin injection (subcutaneous)  5,000 Units Subcutaneous Q8H   lip balm  1 application Topical BID   magnesium oxide  400 mg Oral BID   mouth rinse  15 mL Mouth Rinse BID   melatonin  3 mg Oral QHS   potassium chloride  20 mEq Oral BID   psyllium  1 packet  Oral BID   Continuous Infusions:  piperacillin-tazobactam (ZOSYN)  IV 3.375 g (07/11/21 0618)     LOS: 18 days    Time spent: 25 minutes    07/13/21, MD [facility] Pager [phone]

## 2021-07-11 NOTE — Progress Notes (Addendum)
17 Days Post-Op  Subjective: CC: Doing well. No abdominal pain this am. Tolerating diet without n/v. Having ostomy output. Mobilizing. Voiding.   Objective: Vital signs in last 24 hours: Temp:  [98.2 F (36.8 C)-98.7 F (37.1 C)] 98.2 F (36.8 C) (07/26 0422) Pulse Rate:  [77-83] 77 (07/26 0422) Resp:  [20] 20 (07/26 0422) BP: (91-93)/(59-63) 93/63 (07/26 0422) SpO2:  [96 %-97 %] 96 % (07/26 0422) Weight:  [134.2 kg] 134.2 kg (07/26 0500) Last BM Date: 07/10/21  Intake/Output from previous day: 07/25 0701 - 07/26 0700 In: 777.3 [P.O.:540; I.V.:108.3; IV Piggyback:129] Out: 2760 [Urine:1850; Drains:10; Stool:900] Intake/Output this shift: No intake/output data recorded.  PE: Gen:  Alert, NAD, pleasant Pulm: normal rate and effort  Abd: Soft, mild distension, stable tenderness on the right side of abdomen without peritonitis. +BS. Midline wound with dehiscence without evisceration. There is granulation tissue on wound edges and base. Cleaning up well. Stable from picture yesterday. There is 4cm of tracking inferior portion. There is ~4cm of tracking superior aspect of the wound. Colostomy with semisolid/liquid stool in bag. Colostomy bag in place w/ air and stool in bag. Unable to visualize stoma 2/2 stool. JP drain with thin brown output this morning.  Psych: A&Ox3 Skin: no rashes noted, warm and dry  Lab Results:  Recent Labs    07/10/21 0328 07/11/21 0400  WBC 11.3* 10.1  HGB 10.0* 10.2*  HCT 29.5* 30.5*  PLT 322 313   BMET Recent Labs    07/10/21 0328  NA 137  K 3.9  CL 112*  CO2 20*  GLUCOSE 95  BUN 9  CREATININE 0.94  CALCIUM 7.4*   PT/INR No results for input(s): LABPROT, INR in the last 72 hours. CMP     Component Value Date/Time   NA 137 07/10/2021 0328   K 3.9 07/10/2021 0328   CL 112 (H) 07/10/2021 0328   CO2 20 (L) 07/10/2021 0328   GLUCOSE 95 07/10/2021 0328   BUN 9 07/10/2021 0328   CREATININE 0.94 07/10/2021 0328   CALCIUM 7.4 (L)  07/10/2021 0328   PROT 7.4 06/24/2021 0247   ALBUMIN 3.1 (L) 06/24/2021 0247   AST 12 (L) 06/24/2021 0247   ALT 11 06/24/2021 0247   ALKPHOS 69 06/24/2021 0247   BILITOT 1.3 (H) 06/24/2021 0247   GFRNONAA >60 07/10/2021 0328   Lipase     Component Value Date/Time   LIPASE 22 06/23/2021 1109    Studies/Results: No results found.  Anti-infectives: Anti-infectives (From admission, onward)    Start     Dose/Rate Route Frequency Ordered Stop   07/06/21 1300  piperacillin-tazobactam (ZOSYN) IVPB 3.375 g        3.375 g 12.5 mL/hr over 240 Minutes Intravenous Every 8 hours 07/06/21 1235     06/24/21 2200  cefoTEtan (CEFOTAN) 2 g in sodium chloride 0.9 % 100 mL IVPB        2 g 200 mL/hr over 30 Minutes Intravenous Every 12 hours 06/24/21 1514 06/24/21 2109   06/24/21 0830  cefoTEtan (CEFOTAN) 2 g in sodium chloride 0.9 % 100 mL IVPB        2 g 200 mL/hr over 30 Minutes Intravenous On call to O.R. 06/24/21 0733 06/24/21 1117        Assessment/Plan POD 17 s/p open sigmoid colectomy with end colostomy for obstructing sigmoid colon mass 06/24/21 Dr. Freida Busman - Pathology consistent with diverticulitis. No evidence of malignancy  - CT 7/21 w/ rectal stump leak. Continue  drain and abx - Purulent drainage around the stoma on 7/20. No abscess seen on CT. Wicked  - Midline wound reviewed with MD and CT scan reviewed. Will plan to vac w/ white sponge at the base starting tomorrow  - High output from colostomy. C. Diff negative. On metamucil. Discussed with MD. May be high output if he is not needing to be reabsorbing fluid. Continue to monitor creatinine. No new meds - Mobilize, PT/OT  - No plan for further surgery. Awaiting dispo - SNF (denied by CIR)    FEN: CM. IVF per TR[facility] VTE: SCDs, SQH ID: cefotetan x2 doses. Zosyn 7/21 >> WBC normalized at 10.1 Foley - External    ABL anemia - stable at 10.2 COVID + - per TR[facility] Morbid Obesity - BMI 49.89 BPH    LOS: 18 days    Travis Calderon ,  [facility] Surgery 07/11/2021, 9:21 AM Please see Amion for pager number during day hours 7:00am-4:30pm

## 2021-07-11 NOTE — TOC Progression Note (Addendum)
Transition of Care [facility]) - Progression Note    Patient Details  Name: Travis Calderon MRN: [id number] Date of Birth: 09/08/1956  Transition of Care [facility]) CM/SW Contact  Geni Bers, RN Phone Number: 07/11/2021, 2:40 PM  Clinical Narrative:    Bed offers for SNF given to pt's sister Burna Mortimer.         Expected Discharge Plan and Services                                                 Social Determinants of Health (SDOH) Interventions    Readmission Risk Interventions No flowsheet data found.

## 2021-07-11 NOTE — Progress Notes (Signed)
Physical Therapy Treatment Patient Details Name: Travis Calderon MRN: [id number] DOB: 04/25/1956 Today's Date: 07/11/2021    History of Present Illness This 65 year old male with PMH significant for obesity, diverticulosis presented to the ED 06/23/21 with complaints of abdominal pain and abnormal bowel movements.He is found to be COVID-positive, CT abdomen shows large obstructing colonic mass.  Patient underwent Open sigmoid colectomy with end colostomy.on 06/24/21.  Incisional wound VAC to be placed 07/12/21    PT Comments    Pt ambulated in hallway and appears to be progressing well with mobility.  Continue to recommend SNF as pt has a flight to enter apartment.      Follow Up Recommendations  SNF     Equipment Recommendations  Rolling walker with 5" wheels (wide)    Recommendations for Other Services       Precautions / Restrictions Precautions Precautions: Fall Precaution Comments: colostomy,  right JP drain, urinary urgency, large abdominal  wound/dressing    Mobility  Bed Mobility Overal bed mobility: Needs Assistance Bed Mobility: Supine to Sit     Supine to sit: Supervision;HOB elevated     General bed mobility comments: cues for log roll technique however pt performed his own way; reviewed log roll technique end of session and pt encouraged to use with bed mobility    Transfers Overall transfer level: Needs assistance Equipment used: Rolling walker (2 wheeled) Transfers: Sit to/from Stand Sit to Stand: Supervision         General transfer comment: cues for hand placement, cues to control descent  Ambulation/Gait Ambulation/Gait assistance: Min guard;Supervision Gait Distance (Feet): 240 Feet Assistive device: Rolling walker (2 wheeled) Gait Pattern/deviations: Step-through pattern     General Gait Details: pt steady with RW, tolerated improved distance, denies pain   Stairs             Wheelchair Mobility    Modified Rankin (Stroke  Patients Only)       Balance                                            Cognition Arousal/Alertness: Awake/alert Behavior During Therapy: Flat affect Overall Cognitive Status: Within Functional Limits for tasks assessed                                        Exercises      General Comments        Pertinent Vitals/Pain Pain Assessment: No/denies pain Pain Intervention(s): Repositioned;Monitored during session    Home Living                      Prior Function            PT Goals (current goals can now be found in the care plan section) Progress towards PT goals: Progressing toward goals    Frequency    Min 3X/week      PT Plan Current plan remains appropriate    Co-evaluation              AM-PAC PT "6 Clicks" Mobility   Outcome Measure  Help needed turning from your back to your side while in a flat bed without using bedrails?: A Little Help needed moving from lying on your back to sitting on the side  of a flat bed without using bedrails?: A Little Help needed moving to and from a bed to a chair (including a wheelchair)?: A Little Help needed standing up from a chair using your arms (e.g., wheelchair or bedside chair)?: A Little Help needed to walk in hospital room?: A Little Help needed climbing 3-5 steps with a railing? : A Lot 6 Click Score: 17    End of Session Equipment Utilized During Treatment: Gait belt Activity Tolerance: Patient tolerated treatment well Patient left: in chair;with call bell/phone within reach;with nursing/sitter in room   PT Visit Diagnosis: Difficulty in walking, not elsewhere classified (R26.2)     Time: 6237-6283 PT Time Calculation (min) (ACUTE ONLY): 14 min  Charges:  $Gait Training: 8-22 mins                     Paulino Door, DPT Acute Rehabilitation Services Pager: [phone] Office: [phone]    Travis Calderon E 07/11/2021, 3:57 PM

## 2021-07-12 LAB — CBC WITH DIFFERENTIAL/PLATELET
Abs Immature Granulocytes: 0.1 10*3/uL — ABNORMAL HIGH (ref 0.00–0.07)
Basophils Absolute: 0.1 10*3/uL (ref 0.0–0.1)
Basophils Relative: 1 %
Eosinophils Absolute: 0.3 10*3/uL (ref 0.0–0.5)
Eosinophils Relative: 3 %
HCT: 30.7 % — ABNORMAL LOW (ref 39.0–52.0)
Hemoglobin: 10.2 g/dL — ABNORMAL LOW (ref 13.0–17.0)
Immature Granulocytes: 1 %
Lymphocytes Relative: 12 %
Lymphs Abs: 1.3 10*3/uL (ref 0.7–4.0)
MCH: 29.1 pg (ref 26.0–34.0)
MCHC: 33.2 g/dL (ref 30.0–36.0)
MCV: 87.5 fL (ref 80.0–100.0)
Monocytes Absolute: 1.1 10*3/uL — ABNORMAL HIGH (ref 0.1–1.0)
Monocytes Relative: 11 %
Neutro Abs: 7.6 10*3/uL (ref 1.7–7.7)
Neutrophils Relative %: 72 %
Platelets: 292 10*3/uL (ref 150–400)
RBC: 3.51 MIL/uL — ABNORMAL LOW (ref 4.22–5.81)
RDW: 14.5 % (ref 11.5–15.5)
WBC: 10.4 10*3/uL (ref 4.0–10.5)
nRBC: 0 % (ref 0.0–0.2)

## 2021-07-12 NOTE — Progress Notes (Signed)
OT Cancellation Note  Patient Details Name: Travis Calderon MRN: [id number] DOB: [birth year]   Cancelled Treatment:    Reason Eval/Treat Not Completed: Other (comment). Therapist in mid transfer with patient after being in room several minutes discussing POC and goals with patient and family - when RN requested patient return to supine for wound care and placement of wound vac. Therapist in room additional minutes to answer patient's questions in regards to discharge planning, options, why CIR declined patient. Therapist will count visit but no charge applied.  [name] 07/12/2021, 9:30 AM

## 2021-07-12 NOTE — Consult Note (Signed)
WOC Nurse Consult Note: Reason for Consult: initiating new NPWT to large midline wound Wound type: surgical with dehiscence and facial defect  Pressure Injury POA: NA Measurement:22ccm x 12cm x 8 cm at deepest point (false bottom) distal aspect of wound bed. 5 cm undermining from 11-1 o'clock, 8cm  undermining from 5-9 o'clock   Wound bed:95% granulation tissue; 5% yellow slough Drainage (amount, consistency, odor) moderate, serosanguinous  Periwound: intact  Dressing procedure/placement/frequency: 3 pc of white foam used to cover upper wound bed and to tuck into deepest portion of the wound bed and under the undermining  1pc of black foam used to fill the remainder of the wound bed Sealed at . Patient received IV pain meds prior to dressing application Will plan to change again on Friday; supplies in the room    WOC Nurse ostomy follow up Stoma type/location: LUQ, end colstomy Treatment options for stomal/peristomal skin: using silver hydrofiber in the area of mucocutaneous separation and 2" skin barrier ring  Output formed yellow stools  Ostomy pouching: 2p 2 3/4" with 2" skin barrier ring Education provided:  Did not change pouch today; planning to change with NPWT dressing change Friday; CCS to observe at that time  Enrolled patient in [employer] Discharge program: Yes  Working on DC planning SNF Vs home  WOC Nurse will follow along with you for continued support with ostomy teaching, care and NPWT dressing Ayomide Purdy [facility] MSN, RN, [place], CNS, [place] [phone]

## 2021-07-12 NOTE — TOC Progression Note (Addendum)
Transition of Care [facility]) - Progression Note    Patient Details  Name: Travis Calderon MRN: [id number] Date of Birth: 1955-12-23  Transition [facility]) CM/SW Contact  Geni Bers, RN Phone Number: 07/12/2021, 9:54 AM  Clinical Narrative:     [facility] [place], [phone] ext [phone] Otho Ket, CSW. Pt's MD is Dr. Yetta Barre at [facility].        Expected Discharge Plan and Services                                                 Social Determinants of Health (SDOH) Interventions    Readmission Risk Interventions No flowsheet data found.

## 2021-07-12 NOTE — Progress Notes (Signed)
18 Days Post-Op  Subjective: CC: Doing well. No abdominal pain this am. Tolerating diet without n/v. Colostomy functioning. Voiding. Working with therapies. Awaiting snf.   Objective: Vital signs in last 24 hours: Temp:  [98.5 F (36.9 C)-99.3 F (37.4 C)] 98.5 F (36.9 C) (07/27 0445) Pulse Rate:  [77-87] 77 (07/27 0445) Resp:  [16-18] 16 (07/27 0445) BP: (94-96)/(58-66) 96/64 (07/27 0445) SpO2:  [99 %-100 %] 100 % (07/27 0445) Weight:  [136.7 kg] 136.7 kg (07/27 0445) Last BM Date: 07/11/21  Intake/Output from previous day: 07/26 0701 - 07/27 0700 In: 421.5 [P.O.:360; I.V.:11.5; IV Piggyback:50] Out: 3180 [Urine:2225; Drains:5; Stool:950] Intake/Output this shift: No intake/output data recorded.  PE: Gen:  Alert, NAD, pleasant Heart: RRR Pulm:CTA b/l, normal rate and effort Abd: Soft, ND, stable tenderness on the right side of abdomen without peritonitis. +BS. Midline wound with dehiscence without evisceration. There is granulation tissue on wound edges and base. Cleaning up well. See picture below. Wound vac applied with white sponge covering the base. Colostomy with semisolid/liquid stool in bag. Stoma pink and viable. JP drain with more SS today Psych: A&Ox3 Msk: No LE edema  Skin: no rashes noted, warm and dry    Lab Results:  Recent Labs    07/11/21 0400 07/12/21 0348  WBC 10.1 10.4  HGB 10.2* 10.2*  HCT 30.5* 30.7*  PLT 313 292   BMET Recent Labs    07/10/21 0328  NA 137  K 3.9  CL 112*  CO2 20*  GLUCOSE 95  BUN 9  CREATININE 0.94  CALCIUM 7.4*   PT/INR No results for input(s): LABPROT, INR in the last 72 hours. CMP     Component Value Date/Time   NA 137 07/10/2021 0328   K 3.9 07/10/2021 0328   CL 112 (H) 07/10/2021 0328   CO2 20 (L) 07/10/2021 0328   GLUCOSE 95 07/10/2021 0328   BUN 9 07/10/2021 0328   CREATININE 0.94 07/10/2021 0328   CALCIUM 7.4 (L) 07/10/2021 0328   PROT 7.4 06/24/2021 0247   ALBUMIN 3.1 (L) 06/24/2021 0247    AST 12 (L) 06/24/2021 0247   ALT 11 06/24/2021 0247   ALKPHOS 69 06/24/2021 0247   BILITOT 1.3 (H) 06/24/2021 0247   GFRNONAA >60 07/10/2021 0328   Lipase     Component Value Date/Time   LIPASE 22 06/23/2021 1109    Studies/Results: No results found.  Anti-infectives: Anti-infectives (From admission, onward)    Start     Dose/Rate Route Frequency Ordered Stop   07/06/21 1300  piperacillin-tazobactam (ZOSYN) IVPB 3.375 g        3.375 g 12.5 mL/hr over 240 Minutes Intravenous Every 8 hours 07/06/21 1235     06/24/21 2200  cefoTEtan (CEFOTAN) 2 g in sodium chloride 0.9 % 100 mL IVPB        2 g 200 mL/hr over 30 Minutes Intravenous Every 12 hours 06/24/21 1514 06/24/21 2109   06/24/21 0830  cefoTEtan (CEFOTAN) 2 g in sodium chloride 0.9 % 100 mL IVPB        2 g 200 mL/hr over 30 Minutes Intravenous On call to O.R. 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 Freida Busman - Pathology consistent with diverticulitis. No evidence of malignancy  - CT 7/21 w/ rectal stump leak. Continue drain and abx. Drain more SS today.  - Purulent drainage around the  stoma on 7/20. No abscess seen on CT. Wicked  - Midline wound VAC M/W/F w/ white sponge covering base of the wound - High output from colostomy. C. Diff negative. On metamucil. Stool thickening up. Discussed with MD. May be high output if he is not needing to be reabsorbing fluid. Continue to monitor. No new meds - Mobilize, PT/OT  - Awaiting dispo - SNF (denied by CIR). Family given bed offers yesterday by TOC    FEN: CM. IVF per TRH VTE: SCDs, SQH ID: cefotetan x2 doses. Zosyn 7/21 >> Foley - External    ABL anemia - stable COVID + - off precautions Morbid Obesity - BMI 49.89 BPH    LOS: 19 days    Jacinto Halim , [facility] 07/12/2021, 9:21 AM Please see Amion for pager number during day hours  7:00am-4:30pm

## 2021-07-12 NOTE — TOC Progression Note (Signed)
Transition of Care [facility]) - Progression Note    Patient Details  Name: Travis Calderon MRN: [id number] Date of Birth: 02-06-1956  Transition of Care [facility]) CM/SW Contact  Darleene Cleaver, [place] Phone Number: 07/12/2021, 10:42 AM  Clinical Narrative:     Patient's clinicals have been resent out to other SNFs besides [facility].  Per other [facility] worker, patient's sister did not like any of the choices of SNF.  TOC to continue to follow patient's progress throughout discharge planning.        Expected Discharge Plan and Services                                                 Social Determinants of Health (SDOH) Interventions    Readmission Risk Interventions No flowsheet data found.

## 2021-07-12 NOTE — Progress Notes (Signed)
Occupational Therapy Treatment Patient Details Name: Travis Calderon MRN: [id number] DOB: [birth year] Today's Date: 07/12/2021    History of present illness This 65 year old male with PMH significant for obesity, diverticulosis presented to the ED 06/23/21 with complaints of abdominal pain and abnormal bowel movements.He is found to be COVID-positive, CT abdomen shows large obstructing colonic mass.  Patient underwent Open sigmoid colectomy with end colostomy.on 06/24/21.  Incisional wound VAC to be placed 07/12/21   OT comments  Treatment focused on activity tolerance and self care tasks. Patient reports wanting to get his strength back - and "to not get winded when I walk." Patient supervision with verbal  cues to perform log roll on flat bed. Exhibits more effort and increased time when bed flat. Min guard for standing and ambulation with RW x approximately 400 feet. Patient demonstrated ability to don pants and flip flops with setup. Patient's pants fell to his ankles and he had to sit down again to retrieve them. Therapist reiterated use of ADL kit  (from prior treatment) for home use. Patient has made good progress towards acute care goals. Patient limited by wound vac, abdominal wound and jp drain.      Follow Up Recommendations  Home health OT;SNF    Equipment Recommendations  Other (comment) (bariatric 3 in one)    Recommendations for Other Services      Precautions / Restrictions Precautions Precaution Comments: colostomy,  right JP drain, urinary urgency, large abdominal  wound/dressing, wound vac Restrictions Weight Bearing Restrictions: No       Mobility Bed Mobility Overal bed mobility: Needs Assistance Bed Mobility: Supine to Sit Rolling: Supervision Sidelying to sit: Supervision       General bed mobility comments: Flattened HOB. Patient performed log roll with therapist providing verbal cues - with some difficulty. Still required bed rail to come into sitting and  increased time.    Transfers Overall transfer level: Needs assistance Equipment used: Rolling walker (2 wheeled) Transfers: Sit to/from Stand Sit to Stand: Min guard;From elevated surface         General transfer comment: Patient stood from elevated bed height with min guard and ambulated in hall with RW, approx 400 feet.    Balance Overall balance assessment: Needs assistance Sitting-balance support: No upper extremity supported Sitting balance-Leahy Scale: Good     Standing balance support: During functional activity Standing balance-Leahy Scale: Fair                             ADL either performed or assessed with clinical judgement   ADL Overall ADL's : Needs assistance/impaired                     Lower Body Dressing: Set up;Supervision/safety Lower Body Dressing Details (indicate cue type and reason): Demonstrated ability to don typical clothing - flip flops and pants. Patient's pance fell to ankles when standing. Patient sat back down to pull pants up. Overall supervision for LB dressing.                     Vision Patient Visual Report: No change from baseline     Perception     Praxis      Cognition Arousal/Alertness: Awake/alert Behavior During Therapy: WFL for tasks assessed/performed Overall Cognitive Status: Within Functional Limits for tasks assessed  Exercises     Shoulder Instructions       General Comments      Pertinent Vitals/ Pain       Pain Assessment: No/denies pain  Home Living                                          Prior Functioning/Environment              Frequency  Min 2X/week        Progress Toward Goals  OT Goals(current goals can now be found in the care plan section)  Progress towards OT goals: Progressing toward goals  Acute Rehab OT Goals Patient Stated Goal: return to independence OT Goal  Formulation: With patient/family Time For Goal Achievement: 07/26/21 Potential to Achieve Goals: Good ADL Goals Additional ADL Goal #1: Patient will perform 10 min functional activity or exercise activity as evidence of improving activity tolerance  Plan Discharge plan needs to be updated    Co-evaluation                 AM-PAC OT "6 Clicks" Daily Activity     Outcome Measure   Help from another person eating meals?: None Help from another person taking care of personal grooming?: A Little Help from another person toileting, which includes using toliet, bedpan, or urinal?: A Little Help from another person bathing (including washing, rinsing, drying)?: A Little Help from another person to put on and taking off regular upper body clothing?: A Little Help from another person to put on and taking off regular lower body clothing?: A Little 6 Click Score: 19    End of Session Equipment Utilized During Treatment: Rolling walker  OT Visit Diagnosis: Other abnormalities of gait and mobility (R26.89);Pain   Activity Tolerance Patient tolerated treatment well   Patient Left in chair;with call bell/phone within reach   Nurse Communication Mobility status        Time: 1350-1419 OT Time Calculation (min): 29 min  Charges: OT General Charges $OT Visit: 1 Visit OT Treatments $Self Care/Home Management : 8-22 mins $Therapeutic Activity: 8-22 mins  Athanasia Stanwood, OTR/L [place]  Office [phone] Pager: [place] 07/12/2021, 2:43 PM

## 2021-07-13 LAB — CBC WITH DIFFERENTIAL/PLATELET
Abs Immature Granulocytes: 0.09 10*3/uL — ABNORMAL HIGH (ref 0.00–0.07)
Basophils Absolute: 0.1 10*3/uL (ref 0.0–0.1)
Basophils Relative: 1 %
Eosinophils Absolute: 0.4 10*3/uL (ref 0.0–0.5)
Eosinophils Relative: 4 %
HCT: 30.6 % — ABNORMAL LOW (ref 39.0–52.0)
Hemoglobin: 10.3 g/dL — ABNORMAL LOW (ref 13.0–17.0)
Immature Granulocytes: 1 %
Lymphocytes Relative: 10 %
Lymphs Abs: 1.1 10*3/uL (ref 0.7–4.0)
MCH: 29.6 pg (ref 26.0–34.0)
MCHC: 33.7 g/dL (ref 30.0–36.0)
MCV: 87.9 fL (ref 80.0–100.0)
Monocytes Absolute: 0.9 10*3/uL (ref 0.1–1.0)
Monocytes Relative: 9 %
Neutro Abs: 8.1 10*3/uL — ABNORMAL HIGH (ref 1.7–7.7)
Neutrophils Relative %: 75 %
Platelets: 268 10*3/uL (ref 150–400)
RBC: 3.48 MIL/uL — ABNORMAL LOW (ref 4.22–5.81)
RDW: 14.6 % (ref 11.5–15.5)
WBC: 10.7 10*3/uL — ABNORMAL HIGH (ref 4.0–10.5)
nRBC: 0 % (ref 0.0–0.2)

## 2021-07-13 LAB — BASIC METABOLIC PANEL
Anion gap: 9 (ref 5–15)
BUN: 6 mg/dL — ABNORMAL LOW (ref 8–23)
CO2: 22 mmol/L (ref 22–32)
Calcium: 7.6 mg/dL — ABNORMAL LOW (ref 8.9–10.3)
Chloride: 108 mmol/L (ref 98–111)
Creatinine, Ser: 0.79 mg/dL (ref 0.61–1.24)
GFR, Estimated: >60 mL/min (ref >60)
Glucose, Bld: 117 mg/dL — ABNORMAL HIGH (ref 70–99)
Potassium: 3.4 mmol/L — ABNORMAL LOW (ref 3.5–5.1)
Sodium: 139 mmol/L (ref 135–145)

## 2021-07-13 NOTE — Progress Notes (Signed)
19 Days Post-Op  Subjective: CC: Doing well. No real abdominal pain. Tolerating diet without n/v. Colostomy functioning. Worked with therapies yesterday. Awaiting SNF. Voiding.   Objective: Vital signs in last 24 hours: Temp:  [98.2 F (36.8 C)-101 F (38.3 C)] 98.2 F (36.8 C) (07/28 0656) Pulse Rate:  [79-90] 79 (07/28 0656) Resp:  [14-20] 16 (07/28 0656) BP: (90-105)/(58-70) 105/70 (07/28 0656) SpO2:  [96 %-98 %] 98 % (07/28 0656) Weight:  [135.1 kg] 135.1 kg (07/28 0400) Last BM Date: 07/12/21  Intake/Output from previous day: 07/27 0701 - 07/28 0700 In: 360 [P.O.:360] Out: 1650 [Urine:1275; Stool:375] Intake/Output this shift: No intake/output data recorded.  PE: Gen:  Alert, NAD, pleasant Heart: RRR Pulm:CTA b/l, normal rate and effort Abd: Soft, ND, stable tenderness on the right side of abdomen without peritonitis. +BS. Midline wound with vac. Cannister with SS fluid with some sediment. Colostomy with semisolid/liquid stool in bag. Stoma pink and viable. JP drain SS today Psych: A&Ox3 Msk: No LE edema  Skin: no rashes noted, warm and dry  Lab Results:  Recent Labs    07/12/21 0348 07/13/21 0358  WBC 10.4 10.7*  HGB 10.2* 10.3*  HCT 30.7* 30.6*  PLT 292 268   BMET Recent Labs    07/13/21 0358  NA 139  K 3.4*  CL 108  CO2 22  GLUCOSE 117*  BUN 6*  CREATININE 0.79  CALCIUM 7.6*   PT/INR No results for input(s): LABPROT, INR in the last 72 hours. CMP     Component Value Date/Time   NA 139 07/13/2021 0358   K 3.4 (L) 07/13/2021 0358   CL 108 07/13/2021 0358   CO2 22 07/13/2021 0358   GLUCOSE 117 (H) 07/13/2021 0358   BUN 6 (L) 07/13/2021 0358   CREATININE 0.79 07/13/2021 0358   CALCIUM 7.6 (L) 07/13/2021 0358   PROT 7.4 06/24/2021 0247   ALBUMIN 3.1 (L) 06/24/2021 0247   AST 12 (L) 06/24/2021 0247   ALT 11 06/24/2021 0247   ALKPHOS 69 06/24/2021 0247   BILITOT 1.3 (H) 06/24/2021 0247   GFRNONAA >60 07/13/2021 0358   Lipase      Component Value Date/Time   LIPASE 22 06/23/2021 1109    Studies/Results: No results found.  Anti-infectives: Anti-infectives (From admission, onward)    Start     Dose/Rate Route Frequency Ordered Stop   07/06/21 1300  piperacillin-tazobactam (ZOSYN) IVPB 3.375 g        3.375 g 12.5 mL/hr over 240 Minutes Intravenous Every 8 hours 07/06/21 1235     06/24/21 2200  cefoTEtan (CEFOTAN) 2 g in sodium chloride 0.9 % 100 mL IVPB        2 g 200 mL/hr over 30 Minutes Intravenous Every 12 hours 06/24/21 1514 06/24/21 2109   06/24/21 0830  cefoTEtan (CEFOTAN) 2 g in sodium chloride 0.9 % 100 mL IVPB        2 g 200 mL/hr over 30 Minutes Intravenous On call to O.R. 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 Freida Busman - Pathology consistent with diverticulitis. No evidence of malignancy  - CT 7/21 w/ rectal stump leak. Continue drain and abx. Drain SS today.  - Purulent drainage around the stoma on 7/20. No abscess seen on CT. Wicked. Will see Friday with WOCN - Midline wound VAC M/W/F w/ white sponge covering base of the wound -  High output from colostomy. C. Diff negative. On metamucil. Stool thickening up. Discussed with MD. May be high output if he is not needing to be reabsorbing fluid. Continue to monitor. No new meds. Cr okay.  - Mobilize, PT/OT  - Awaiting dispo - SNF (denied by CIR).     FEN: CM. IVF per TRH VTE: SCDs, SQH ID: cefotetan x2 doses. Zosyn 7/21 >> Foley - External    ABL anemia - stable COVID + - off precautions Morbid Obesity - BMI 49.89 BPH    LOS: 20 days    Travis Calderon , [facility] 07/13/2021, 7:45 AM Please see Amion for pager number during day hours 7:00am-4:30pm

## 2021-07-13 NOTE — TOC Progression Note (Signed)
Transition of Care [facility]) - Progression Note    Patient Details  Name: Travis Calderon MRN: [id number] Date of Birth: 1956-04-19  Transition [facility] Care [facility]) CM/SW Contact  Darleene Cleaver, [place] Phone Number: 07/13/2021, 4:21 PM  Clinical Narrative:     CSW spoke to [name], and she stated she is touring [employer].  CSW updated SNF, that patient's sister is interested in facility.  CSW explained to patient's sister the process for getting insurance authorization, what to expect, and how insurance pays for stay.  CSW expressed that insurance may deny patient and if they do, the options are appealing, paying privately, or going home with home health.  CSW was asked by patient's sister if VA would be contacted for SNFs, CSW explained that there are very limited amount of VA approved SNFs that can accept patient, and they are not very close to [place].  CSW will start insurance authorization tomorrow pending medical readiness.        Expected Discharge Plan and Services   Patient to go to SNF for wound care, IV antibiotics, and rehab.                                               Social Determinants of Health (SDOH) Interventions    Readmission Risk Interventions No flowsheet data found.

## 2021-07-14 ENCOUNTER — Inpatient Hospital Stay (HOSPITAL_COMMUNITY): Payer: Medicare Other

## 2021-07-14 LAB — CBC WITH DIFFERENTIAL/PLATELET
Abs Immature Granulocytes: 0.1 10*3/uL — ABNORMAL HIGH (ref 0.00–0.07)
Basophils Absolute: 0.1 10*3/uL (ref 0.0–0.1)
Basophils Relative: 1 %
Eosinophils Absolute: 0.4 10*3/uL (ref 0.0–0.5)
Eosinophils Relative: 4 %
HCT: 29.2 % — ABNORMAL LOW (ref 39.0–52.0)
Hemoglobin: 9.9 g/dL — ABNORMAL LOW (ref 13.0–17.0)
Immature Granulocytes: 1 %
Lymphocytes Relative: 12 %
Lymphs Abs: 1.1 10*3/uL (ref 0.7–4.0)
MCH: 29.6 pg (ref 26.0–34.0)
MCHC: 33.9 g/dL (ref 30.0–36.0)
MCV: 87.2 fL (ref 80.0–100.0)
Monocytes Absolute: 0.8 10*3/uL (ref 0.1–1.0)
Monocytes Relative: 8 %
Neutro Abs: 7.2 10*3/uL (ref 1.7–7.7)
Neutrophils Relative %: 74 %
Platelets: 239 10*3/uL (ref 150–400)
RBC: 3.35 MIL/uL — ABNORMAL LOW (ref 4.22–5.81)
RDW: 14.4 % (ref 11.5–15.5)
WBC: 9.7 10*3/uL (ref 4.0–10.5)
nRBC: 0 % (ref 0.0–0.2)

## 2021-07-14 MED ORDER — IOHEXOL 9 MG/ML PO SOLN
500.0000 mL | ORAL | Status: AC
  Administered 2021-07-14: 500 mL via ORAL

## 2021-07-14 MED ORDER — IOHEXOL 350 MG/ML SOLN
100.0000 mL | Freq: Once | INTRAVENOUS | Status: AC | PRN
  Administered 2021-07-14: 100 mL via INTRAVENOUS

## 2021-07-14 MED ORDER — SODIUM CHLORIDE (PF) 0.9 % IJ SOLN
INTRAMUSCULAR | Status: AC
  Filled 2021-07-14: qty 50

## 2021-07-14 MED ORDER — IOHEXOL 9 MG/ML PO SOLN
ORAL | Status: AC
  Administered 2021-07-14: 500 mL via ORAL
  Filled 2021-07-14: qty 1000

## 2021-07-14 MED ORDER — ACETAMINOPHEN 325 MG PO TABS: 650.0000 mg | ORAL_TABLET | Freq: Four times a day (QID) | ORAL | Status: DC | PRN

## 2021-07-14 NOTE — Progress Notes (Signed)
20 Days Post-Op  Subjective: CC: Doing well. No abdominal pain, n/v. Having ostomy output. Mobilizing. Voiding. Tolerating diet. See with WOCN for vac change.   Objective: Vital signs in last 24 hours: Temp:  [98.3 F (36.8 C)] 98.3 F (36.8 C) (07/28 1407) Pulse Rate:  [79] 79 (07/28 1407) Resp:  [18] 18 (07/28 1407) BP: (95)/(62) 95/62 (07/28 1407) SpO2:  [96 %] 96 % (07/28 1407) Weight:  [134.8 kg] 134.8 kg (07/29 0500) Last BM Date: 07/13/21  Intake/Output from previous day: 07/28 0701 - 07/29 0700 In: 480 [P.O.:480] Out: 2500 [Urine:1900; Drains:50; Stool:550] Intake/Output this shift: Total I/O In: -  Out: 200 [Urine:200]  PE: Gen:  Alert, NAD, pleasant Heart: RRR Pulm:CTA b/l, normal rate and effort Abd: Soft, ND, stable tenderness on the right side of abdomen without peritonitis. +BS. Midline wound vac removed. Wound with healthy beefy red granulation tissue at the base. There is two small openings noted on the mid right portion of the wound draining purulent material as seen in the picture below. Discussed with MD - who recommended changing to WTD and trimming sutures and fibrinous material. Did not explore openings. Colostomy with semisolid/liquid stool in bag. Stoma pink, budded and viable. There is mucocutaneous separation at the inferior portion of the stoma w/ purulent drainage that is able to be expressed from the wound (see picture below). Fascia appears intact when probed with sterile cotton swab below. The area was wicked. JP drain SS today Psych: A&Ox3 Msk: No LE edema  Skin: no rashes noted, warm and dry            Lab Results:  Recent Labs    07/13/21 0358 07/14/21 0349  WBC 10.7* 9.7  HGB 10.3* 9.9*  HCT 30.6* 29.2*  PLT 268 239   BMET Recent Labs    07/13/21 0358  NA 139  K 3.4*  CL 108  CO2 22  GLUCOSE 117*  BUN 6*  CREATININE 0.79  CALCIUM 7.6*   PT/INR No results for input(s): LABPROT, INR in the last 72  hours. CMP     Component Value Date/Time   NA 139 07/13/2021 0358   K 3.4 (L) 07/13/2021 0358   CL 108 07/13/2021 0358   CO2 22 07/13/2021 0358   GLUCOSE 117 (H) 07/13/2021 0358   BUN 6 (L) 07/13/2021 0358   CREATININE 0.79 07/13/2021 0358   CALCIUM 7.6 (L) 07/13/2021 0358   PROT 7.4 06/24/2021 0247   ALBUMIN 3.1 (L) 06/24/2021 0247   AST 12 (L) 06/24/2021 0247   ALT 11 06/24/2021 0247   ALKPHOS 69 06/24/2021 0247   BILITOT 1.3 (H) 06/24/2021 0247   GFRNONAA >60 07/13/2021 0358   Lipase     Component Value Date/Time   LIPASE 22 06/23/2021 1109    Studies/Results: No results found.  Anti-infectives: Anti-infectives (From admission, onward)    Start     Dose/Rate Route Frequency Ordered Stop   07/06/21 1300  piperacillin-tazobactam (ZOSYN) IVPB 3.375 g        3.375 g 12.5 mL/hr over 240 Minutes Intravenous Every 8 hours 07/06/21 1235     06/24/21 2200  cefoTEtan (CEFOTAN) 2 g in sodium chloride 0.9 % 100 mL IVPB        2 g 200 mL/hr over 30 Minutes Intravenous Every 12 hours 06/24/21 1514 06/24/21 2109   06/24/21 0830  cefoTEtan (CEFOTAN) 2 g in sodium chloride 0.9 % 100 mL IVPB        2  g 200 mL/hr over 30 Minutes Intravenous On call to O.R. 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 Freida Busman - Pathology consistent with diverticulitis. No evidence of malignancy  - CT 7/21 w/ rectal stump leak. Continue drain and abx. Drain SS today.  - Midline wound with purulent drainage from mid portion of wound. Concern for ECF vs decompressing intra-abdominal abscess that is decompressing through this. Discussed with MD. No plans for surgical intervention. Will obtain CT A/P w/ PO and IV contrast to eval.  - BID WTD for midline   - High output from colostomy improved. C. Diff negative.  - Mobilize, PT/OT  - Awaiting dispo - SNF (denied by CIR).    FEN: CM. IVF per  TRH VTE: SCDs, SQH ID: cefotetan x2 doses. Zosyn 7/21 >> (determine abx duration after results of CT) Foley - External    ABL anemia - stable COVID + - off precautions Morbid Obesity - BMI 49.89 BPH    LOS: 21 days    Travis Calderon , [facility] 07/14/2021, 9:50 AM Please see Amion for pager number during day hours 7:00am-4:30pm

## 2021-07-14 NOTE — Progress Notes (Signed)
Physical Therapy Treatment Patient Details Name: Travis Calderon MRN: [id number] DOB: [birth year] Today's Date: 07/14/2021    History of Present Illness This 64 year old male with PMH significant for obesity, diverticulosis presented to the ED 06/23/21 with complaints of abdominal pain and abnormal bowel movements.He is found to be COVID-positive, CT abdomen shows large obstructing colonic mass.  Patient underwent Open sigmoid colectomy with end colostomy.on 06/24/21.  Incisional wound VAC to be placed 07/12/21    PT Comments    The patient is progressing in bed mobility, still requires bed rail use. Patient ambulated 400'  using RW. Gait steady. If  patient will DC to his aparttment, he has ~ 13 steps.   Follow Up Recommendations  SNF     Equipment Recommendations  Rolling walker with 5" wheels    Recommendations for Other Services       Precautions / Restrictions Precautions Precautions: Fall;None Precaution Comments: colostomy,  right JP drain, urinary urgency, large abdominal  wound/dressing,    Mobility  Bed Mobility   Bed Mobility: Supine to Sit           General bed mobility comments: mod I to sit up to bed edge using rails , after ambulatrion, remained seated on bed edge    Transfers Overall transfer level: Needs assistance Equipment used: Rolling walker (2 wheeled) Transfers: Sit to/from Stand Sit to Stand: Min guard;From elevated surface         General transfer comment: Patient stood from elevated bed height with min guard  Ambulation/Gait Ambulation/Gait assistance: Min guard Gait Distance (Feet): 400 Feet Assistive device: Rolling walker (2 wheeled) Gait Pattern/deviations: Step-through pattern     General Gait Details: pt steady with RW, tolerated improved distance,   Stairs             Wheelchair Mobility    Modified Rankin (Stroke Patients Only)       Balance Overall balance assessment: Needs assistance Sitting-balance support:  No upper extremity supported Sitting balance-Leahy Scale: Good                                      Cognition Arousal/Alertness: Awake/alert Behavior During Therapy: WFL for tasks assessed/performed                                          Exercises      General Comments        Pertinent Vitals/Pain Faces Pain Scale: Hurts little more Pain Location: abdomen Pain Descriptors / Indicators: Grimacing;Guarding;Sore    Home Living                      Prior Function            PT Goals (current goals can now be found in the care plan section) Progress towards PT goals: Progressing toward goals    Frequency    Min 3X/week      PT Plan Current plan remains appropriate    Co-evaluation              AM-PAC PT "6 Clicks" Mobility   Outcome Measure  Help needed turning from your back to your side while in a flat bed without using bedrails?: A Little Help needed moving from lying on your back to sitting on the  side of a flat bed without using bedrails?: A Little Help needed moving to and from a bed to a chair (including a wheelchair)?: A Little Help needed standing up from a chair using your arms (e.g., wheelchair or bedside chair)?: A Little Help needed to walk in hospital room?: A Little Help needed climbing 3-5 steps with a railing? : A Lot 6 Click Score: 17    End of Session   Activity Tolerance: Patient tolerated treatment well Patient left: in bed Nurse Communication: Mobility status PT Visit Diagnosis: Difficulty in walking, not elsewhere classified (R26.2)     Time: 0160-1093 PT Time Calculation (min) (ACUTE ONLY): 24 min  Charges:  $Gait Training: 23-37 mins                    Blanchard Kelch PT Acute Rehabilitation Services Pager [phone] Office [phone]    Rada Hay 07/14/2021, 4:46 PM

## 2021-07-14 NOTE — Consult Note (Signed)
WOC Nurse wound follow up Wound type: surgical  Measurement: see previous notes Wound bed: 100% granulation, red, clean, slough removed as well as sutures per CCS today. When NPWT dressing removed opening central wound bed; slightly right lateral wound bed opening draining copious thick yellow drainage with odor. Noted odor when I came in but VAC canister did not appear completely purulent. CCS at bedside; images added.  Drainage (amount, consistency, odor) see above  Periwound: intact; colostomy left lateral wound edge Dressing procedure/placement/frequency: Removed NPWT dressing per CCS PA  See findings.  Saline moist kelix packed under undermining; and tunneling. Toped with ABD pads, secured with tape. Change BID or more often if needed for purulent drainage. Discussed this with bedside RN.  CT of abdomen and pelvis planned today DC delayed.   WOC Nurse will follow along with you for continued support with ostomy teaching and care Aaliyha Mumford [facility], RN, [place], CNS, [place] [phone]

## 2021-07-15 LAB — CBC WITH DIFFERENTIAL/PLATELET
Abs Immature Granulocytes: 0.13 10*3/uL — ABNORMAL HIGH (ref 0.00–0.07)
Basophils Absolute: 0 10*3/uL (ref 0.0–0.1)
Basophils Relative: 0 %
Eosinophils Absolute: 0.3 10*3/uL (ref 0.0–0.5)
Eosinophils Relative: 2 %
HCT: 33.5 % — ABNORMAL LOW (ref 39.0–52.0)
Hemoglobin: 11.5 g/dL — ABNORMAL LOW (ref 13.0–17.0)
Immature Granulocytes: 1 %
Lymphocytes Relative: 4 %
Lymphs Abs: 0.6 10*3/uL — ABNORMAL LOW (ref 0.7–4.0)
MCH: 29.1 pg (ref 26.0–34.0)
MCHC: 34.3 g/dL (ref 30.0–36.0)
MCV: 84.8 fL (ref 80.0–100.0)
Monocytes Absolute: 0.4 10*3/uL (ref 0.1–1.0)
Monocytes Relative: 3 %
Neutro Abs: 13.7 10*3/uL — ABNORMAL HIGH (ref 1.7–7.7)
Neutrophils Relative %: 90 %
Platelets: 251 10*3/uL (ref 150–400)
RBC: 3.95 MIL/uL — ABNORMAL LOW (ref 4.22–5.81)
RDW: 14.3 % (ref 11.5–15.5)
WBC: 15.2 10*3/uL — ABNORMAL HIGH (ref 4.0–10.5)
nRBC: 0 % (ref 0.0–0.2)

## 2021-07-15 LAB — BASIC METABOLIC PANEL
Anion gap: 13 (ref 5–15)
BUN: 7 mg/dL — ABNORMAL LOW (ref 8–23)
CO2: 19 mmol/L — ABNORMAL LOW (ref 22–32)
Calcium: 7.7 mg/dL — ABNORMAL LOW (ref 8.9–10.3)
Chloride: 103 mmol/L (ref 98–111)
Creatinine, Ser: 1.02 mg/dL (ref 0.61–1.24)
GFR, Estimated: >60 mL/min (ref >60)
Glucose, Bld: 88 mg/dL (ref 70–99)
Potassium: 3.6 mmol/L (ref 3.5–5.1)
Sodium: 135 mmol/L (ref 135–145)

## 2021-07-15 NOTE — Progress Notes (Signed)
21 Days Post-Op  Subjective: CC: Doing well. No abdominal pain, n/v. Having ostomy output. Mobilizing. Voiding. Tolerating diet.   Objective: Vital signs in last 24 hours: Temp:  [98.6 F (37 C)-100.1 F (37.8 C)] 99.6 F (37.6 C) (07/30 0346) Pulse Rate:  [78-102] 100 (07/30 0346) Resp:  [16-20] 16 (07/30 0346) BP: (94-131)/(67-70) 94/70 (07/30 0346) SpO2:  [97 %] 97 % (07/30 0346) Weight:  [137.7 kg] 137.7 kg (07/30 0350) Last BM Date: 07/14/21 (via LUQ colostomy)  Intake/Output from previous day: 07/29 0701 - 07/30 0700 In: 663.7 [I.V.:213.7; IV Piggyback:450] Out: 1950 [Urine:1745; Drains:5; Stool:200] Intake/Output this shift: No intake/output data recorded.  PE: Gen:  Alert, NAD, pleasant Heart: RRR Pulm:CTA b/l, normal rate and effort Abd: Soft, NT to palp Psych: A&Ox3 Msk: No LE edema  Skin: no rashes noted, warm and dry            Lab Results:  Recent Labs    07/14/21 0349 07/15/21 0410  WBC 9.7 15.2*  HGB 9.9* 11.5*  HCT 29.2* 33.5*  PLT 239 251    BMET Recent Labs    07/13/21 0358 07/15/21 0410  NA 139 135  K 3.4* 3.6  CL 108 103  CO2 22 19*  GLUCOSE 117* 88  BUN 6* 7*  CREATININE 0.79 1.02  CALCIUM 7.6* 7.7*    PT/INR No results for input(s): LABPROT, INR in the last 72 hours. CMP     Component Value Date/Time   NA 135 07/15/2021 0410   K 3.6 07/15/2021 0410   CL 103 07/15/2021 0410   CO2 19 (L) 07/15/2021 0410   GLUCOSE 88 07/15/2021 0410   BUN 7 (L) 07/15/2021 0410   CREATININE 1.02 07/15/2021 0410   CALCIUM 7.7 (L) 07/15/2021 0410   PROT 7.4 06/24/2021 0247   ALBUMIN 3.1 (L) 06/24/2021 0247   AST 12 (L) 06/24/2021 0247   ALT 11 06/24/2021 0247   ALKPHOS 69 06/24/2021 0247   BILITOT 1.3 (H) 06/24/2021 0247   GFRNONAA >60 07/15/2021 0410   Lipase     Component Value Date/Time   LIPASE 22 06/23/2021 1109    Studies/Results: CT ABDOMEN PELVIS W CONTRAST  Result Date: 07/14/2021 CLINICAL DATA:   Purulent drainage from midline wound. Postoperative fever. Abdominal pain. EXAM: CT ABDOMEN AND PELVIS WITH CONTRAST TECHNIQUE: Multidetector CT imaging of the abdomen and pelvis was performed using the standard protocol following bolus administration of intravenous contrast. CONTRAST:  OMNIPAQUE IOHEXOL 350 MG/ML SOLN COMPARISON:  07/05/2021 FINDINGS: Lower chest: Increased peripheral airspace opacity in the left lower lobe, a component of which is due to atelectasis although superimposed pneumonia is not excluded especially for example on image 27 series 4. Bandlike atelectasis noted in the lingula along with mild peripheral atelectasis in the right lower lobe. Trace pleural fluid on the left. Mild cardiomegaly. Hepatobiliary: Unremarkable Pancreas: Unremarkable Spleen: Unremarkable Adrenals/Urinary Tract: 1.8 by 1.6 cm hypodense lesion of the right kidney lower pole medially on image 47 series 2, likely a cyst although technically nonspecific. Similar 1.5 by 1.3 cm left mid kidney lesion anterolaterally on image 40 series 2. Urinary bladder unremarkable. Stomach/Bowel: Left-sided colostomy noted with Hartmann's pouch, gas density associated with the Hartmann's pouch extending beyond the staple line for example on image 71 of series 2, with a drainage catheter terminating in the vicinity of this presumed breakdown of the staple line. Similar to previous the apparent extraluminal gas in this vicinity has a more left lateral linear component and  a localized rounded component further medially as shown on images 63-72 of series 2. The more lateral gas collection terminates in a small collection of complex fluid with enhancing margin in the left paracolic gutter. There continue to be a couple of foci of extraluminal gas in the subcutaneous tissues along the margin of the colostomy, and also intra-abdominally near the ostomy site for example on images 34-40 of series 2. No dilated bowel is identified.  Vascular/Lymphatic: 1.0 cm left periaortic lymph node and small additional retroperitoneal lymph nodes are likely reactive. Reproductive: Unremarkable Other: The midline anterior abdominal wall wound is healing by secondary intention. There is substantial separation of the subcutaneous tissues in the area of the wound, by about 11.2 cm. Low-level edema is present in the omentum underlying the anterior abdominal wall in the region of the wound. Musculoskeletal: Prominent right and moderate left degenerative hip arthropathy. Bridging spurring of the sacroiliac joints. Lower lumbar spondylosis and degenerative disc disease causing bilateral foraminal impingement at L4-5 and L5-S1. IMPRESSION: 1. The area of breakdown along the stapled margin of the Hartmann's pouch appears similar, includes a linear gas collection extending from the staple line towards the left paracolic gutter where there is a small complex fluid collection with enhancing margins that may represent abscess. There is also a more medial gas collection along the mesentery near the breakdown site which is similar to prior. 2. There continue to be a fused tiny locules of gas in the subcutaneous tissues and along the peritoneal margin near the ostomy site. Likewise similar appearance of wound healing by secondary intention, with a wide gap between the subcutaneous tissues and with edema in the omentum deep to the wound site. 3. Increased peripheral airspace opacity in the left lower lobe some of which is from atelectasis but a component of pneumonia cannot be excluded. Trace left pleural effusion. 4. Prominent right and moderate left degenerative hip arthropathy. 5. Foraminal impingement at L4-5 and L5-S1 bilaterally. Electronically Signed   By: Gaylyn Rong M.D.   On: 07/14/2021 17:04    Anti-infectives: Anti-infectives (From admission, onward)    Start     Dose/Rate Route Frequency Ordered Stop   07/06/21 1300  piperacillin-tazobactam (ZOSYN)  IVPB 3.375 g        3.375 g 12.5 mL/hr over 240 Minutes Intravenous Every 8 hours 07/06/21 1235     06/24/21 2200  cefoTEtan (CEFOTAN) 2 g in sodium chloride 0.9 % 100 mL IVPB        2 g 200 mL/hr over 30 Minutes Intravenous Every 12 hours 06/24/21 1514 06/24/21 2109   06/24/21 0830  cefoTEtan (CEFOTAN) 2 g in sodium chloride 0.9 % 100 mL IVPB        2 g 200 mL/hr over 30 Minutes Intravenous On call to O.R. 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 Freida Busman - Pathology consistent with diverticulitis. No evidence of malignancy  - CT 7/21 w/ rectal stump leak. Continue drain and abx. Drain SS today.  CT 7/29: similar appearance at staple line, small L paracolic gutter fluid collection, no signs of ECF - WBC elevated to 15 today - BID WTD for midline, should be able to replace Vac on Mon - High output from colostomy improved. C. Diff negative.  - Mobilize, PT/OT  - Awaiting dispo - SNF (denied by CIR).    FEN: CM. IVF per [facility] VTE:  SCDs, SQH ID: cefotetan x2 doses. Zosyn 7/21 >> (determine abx duration after results of CT) Foley - External    ABL anemia - stable COVID + - off precautions Morbid Obesity - BMI 49.89 BPH    LOS: 22 days    Travis Calderon , [facility] 07/15/2021, 10:15 AM Please see Amion for pager number during day hours 7:00am-4:30pm

## 2021-07-16 LAB — CBC WITH DIFFERENTIAL/PLATELET
Abs Immature Granulocytes: 0.1 10*3/uL — ABNORMAL HIGH (ref 0.00–0.07)
Basophils Absolute: 0 10*3/uL (ref 0.0–0.1)
Basophils Relative: 0 %
Eosinophils Absolute: 0.8 10*3/uL — ABNORMAL HIGH (ref 0.0–0.5)
Eosinophils Relative: 8 %
HCT: 31 % — ABNORMAL LOW (ref 39.0–52.0)
Hemoglobin: 10.5 g/dL — ABNORMAL LOW (ref 13.0–17.0)
Immature Granulocytes: 1 %
Lymphocytes Relative: 7 %
Lymphs Abs: 0.7 10*3/uL (ref 0.7–4.0)
MCH: 29.2 pg (ref 26.0–34.0)
MCHC: 33.9 g/dL (ref 30.0–36.0)
MCV: 86.4 fL (ref 80.0–100.0)
Monocytes Absolute: 0.5 10*3/uL (ref 0.1–1.0)
Monocytes Relative: 5 %
Neutro Abs: 7.9 10*3/uL — ABNORMAL HIGH (ref 1.7–7.7)
Neutrophils Relative %: 79 %
Platelets: 202 10*3/uL (ref 150–400)
RBC: 3.59 MIL/uL — ABNORMAL LOW (ref 4.22–5.81)
RDW: 14.6 % (ref 11.5–15.5)
WBC: 10 10*3/uL (ref 4.0–10.5)
nRBC: 0 % (ref 0.0–0.2)

## 2021-07-16 LAB — PREALBUMIN: Prealbumin: 6.4 mg/dL — ABNORMAL LOW (ref 18–38)

## 2021-07-16 NOTE — Progress Notes (Signed)
22 Days Post-Op  Subjective: CC: Doing well. No abdominal pain, n/v. Having ostomy output. Mobilizing. Voiding. Tolerating diet.   Objective: Vital signs in last 24 hours: Temp:  [98 F (36.7 C)-99.2 F (37.3 C)] 99.2 F (37.3 C) (07/31 0536) Pulse Rate:  [78-85] 81 (07/31 0536) Resp:  [18] 18 (07/31 0536) BP: (98-103)/(61-68) 103/68 (07/31 0536) SpO2:  [97 %-100 %] 97 % (07/31 0536) Last BM Date: 07/15/21 (colostomy)  Intake/Output from previous day: 07/30 0701 - 07/31 0700 In: -  Out: 921 [Urine:875; Drains:6; Stool:40] Intake/Output this shift: No intake/output data recorded.  PE: Gen:  Alert, NAD, pleasant Abd: Soft, NT to palp, wound packed Psych: A&Ox3 Msk: No LE edema  Skin: no rashes noted, warm and dry    Lab Results:  Recent Labs    07/15/21 0410 07/16/21 0403  WBC 15.2* 10.0  HGB 11.5* 10.5*  HCT 33.5* 31.0*  PLT 251 202    BMET Recent Labs    07/15/21 0410  NA 135  K 3.6  CL 103  CO2 19*  GLUCOSE 88  BUN 7*  CREATININE 1.02  CALCIUM 7.7*    PT/INR No results for input(s): LABPROT, INR in the last 72 hours. CMP     Component Value Date/Time   NA 135 07/15/2021 0410   K 3.6 07/15/2021 0410   CL 103 07/15/2021 0410   CO2 19 (L) 07/15/2021 0410   GLUCOSE 88 07/15/2021 0410   BUN 7 (L) 07/15/2021 0410   CREATININE 1.02 07/15/2021 0410   CALCIUM 7.7 (L) 07/15/2021 0410   PROT 7.4 06/24/2021 0247   ALBUMIN 3.1 (L) 06/24/2021 0247   AST 12 (L) 06/24/2021 0247   ALT 11 06/24/2021 0247   ALKPHOS 69 06/24/2021 0247   BILITOT 1.3 (H) 06/24/2021 0247   GFRNONAA >60 07/15/2021 0410   Lipase     Component Value Date/Time   LIPASE 22 06/23/2021 1109    Studies/Results: CT ABDOMEN PELVIS W CONTRAST  Result Date: 07/14/2021 CLINICAL DATA:  Purulent drainage from midline wound. Postoperative fever. Abdominal pain. EXAM: CT ABDOMEN AND PELVIS WITH CONTRAST TECHNIQUE: Multidetector CT imaging of the abdomen and pelvis was performed  using the standard protocol following bolus administration of intravenous contrast. CONTRAST:  OMNIPAQUE IOHEXOL 350 MG/ML SOLN COMPARISON:  07/05/2021 FINDINGS: Lower chest: Increased peripheral airspace opacity in the left lower lobe, a component of which is due to atelectasis although superimposed pneumonia is not excluded especially for example on image 27 series 4. Bandlike atelectasis noted in the lingula along with mild peripheral atelectasis in the right lower lobe. Trace pleural fluid on the left. Mild cardiomegaly. Hepatobiliary: Unremarkable Pancreas: Unremarkable Spleen: Unremarkable Adrenals/Urinary Tract: 1.8 by 1.6 cm hypodense lesion of the right kidney lower pole medially on image 47 series 2, likely a cyst although technically nonspecific. Similar 1.5 by 1.3 cm left mid kidney lesion anterolaterally on image 40 series 2. Urinary bladder unremarkable. Stomach/Bowel: Left-sided colostomy noted with Hartmann's pouch, gas density associated with the Hartmann's pouch extending beyond the staple line for example on image 71 of series 2, with a drainage catheter terminating in the vicinity of this presumed breakdown of the staple line. Similar to previous the apparent extraluminal gas in this vicinity has a more left lateral linear component and a localized rounded component further medially as shown on images 63-72 of series 2. The more lateral gas collection terminates in a small collection of complex fluid with enhancing margin in the left paracolic gutter.  There continue to be a couple of foci of extraluminal gas in the subcutaneous tissues along the margin of the colostomy, and also intra-abdominally near the ostomy site for example on images 34-40 of series 2. No dilated bowel is identified. Vascular/Lymphatic: 1.0 cm left periaortic lymph node and small additional retroperitoneal lymph nodes are likely reactive. Reproductive: Unremarkable Other: The midline anterior abdominal wall wound is  healing by secondary intention. There is substantial separation of the subcutaneous tissues in the area of the wound, by about 11.2 cm. Low-level edema is present in the omentum underlying the anterior abdominal wall in the region of the wound. Musculoskeletal: Prominent right and moderate left degenerative hip arthropathy. Bridging spurring of the sacroiliac joints. Lower lumbar spondylosis and degenerative disc disease causing bilateral foraminal impingement at L4-5 and L5-S1. IMPRESSION: 1. The area of breakdown along the stapled margin of the Hartmann's pouch appears similar, includes a linear gas collection extending from the staple line towards the left paracolic gutter where there is a small complex fluid collection with enhancing margins that may represent abscess. There is also a more medial gas collection along the mesentery near the breakdown site which is similar to prior. 2. There continue to be a fused tiny locules of gas in the subcutaneous tissues and along the peritoneal margin near the ostomy site. Likewise similar appearance of wound healing by secondary intention, with a wide gap between the subcutaneous tissues and with edema in the omentum deep to the wound site. 3. Increased peripheral airspace opacity in the left lower lobe some of which is from atelectasis but a component of pneumonia cannot be excluded. Trace left pleural effusion. 4. Prominent right and moderate left degenerative hip arthropathy. 5. Foraminal impingement at L4-5 and L5-S1 bilaterally. Electronically Signed   By: Gaylyn Rong M.D.   On: 07/14/2021 17:04    Anti-infectives: Anti-infectives (From admission, onward)    Start     Dose/Rate Route Frequency Ordered Stop   07/06/21 1300  piperacillin-tazobactam (ZOSYN) IVPB 3.375 g        3.375 g 12.5 mL/hr over 240 Minutes Intravenous Every 8 hours 07/06/21 1235     06/24/21 2200  cefoTEtan (CEFOTAN) 2 g in sodium chloride 0.9 % 100 mL IVPB        2 g 200 mL/hr  over 30 Minutes Intravenous Every 12 hours 06/24/21 1514 06/24/21 2109   06/24/21 0830  cefoTEtan (CEFOTAN) 2 g in sodium chloride 0.9 % 100 mL IVPB        2 g 200 mL/hr over 30 Minutes Intravenous On call to O.R. 06/24/21 0733 06/24/21 1117        Assessment/Plan POD 22 s/p open sigmoid colectomy with end colostomy for obstructing sigmoid colon mass 06/24/21 Dr. Freida Busman - Pathology consistent with diverticulitis. No evidence of malignancy  - CT 7/21 w/ rectal stump leak. Continue drain and abx. Drain SS today.  CT 7/29: similar appearance at staple line, small L paracolic gutter fluid collection, no signs of ECF - WBC normal today - BID WTD for midline, should be able to replace Vac on Mon - High output from colostomy improved. C. Diff negative.  - Mobilize, PT/OT  - Awaiting dispo - SNF (denied by CIR).    FEN: CM. IVF per [facility] VTE: SCDs, SQH ID: cefotetan x2 doses. Zosyn 7/21 >> (determine abx duration after results of CT) Foley - External    ABL anemia - stable COVID + - off precautions Morbid Obesity - BMI 49.89 BPH  LOS: 23 days   Vanita Panda, MD  Colorectal and General Surgery [facility]  Please see Amion for pager number during day hours 7:00am-4:30pm

## 2021-07-17 NOTE — Plan of Care (Signed)
  Problem: Clinical Measurements: Goal: Cardiovascular complication will be avoided Outcome: Progressing   Problem: Activity: Goal: Risk for activity intolerance will decrease Outcome: Progressing   Problem: Nutrition: Goal: Adequate nutrition will be maintained Outcome: Progressing   Problem: Coping: Goal: Level of anxiety will decrease Outcome: Progressing   Problem: Elimination: Goal: Will not experience complications related to bowel motility Outcome: Progressing   

## 2021-07-17 NOTE — Progress Notes (Signed)
Physical Therapy Treatment Patient Details Name: Travis Calderon MRN: [id number] DOB: 1956/08/13 Today's Date: 07/17/2021    History of Present Illness This 65 year old male with PMH significant for obesity, diverticulosis presented to the ED 06/23/21 with complaints of abdominal pain and abnormal bowel movements.He is found to be COVID-positive, CT abdomen shows large obstructing colonic mass.  Patient underwent Open sigmoid colectomy with end colostomy.on 06/24/21.  Incisional wound VAC to be placed 07/12/21    PT Comments    Pt just finished lunch on arrival to room.  Pt ambulated in hallway and mobility progressing well.  Current plan is for SNF. Pt has large abdominal wound, JP drain and colostomy.  If home, pt needs to be able to perform 13 steps.    Follow Up Recommendations  SNF     Equipment Recommendations  Rolling walker with 5" wheels    Recommendations for Other Services       Precautions / Restrictions Precautions Precautions: Fall Precaution Comments: colostomy,  right JP drain, urinary urgency, large abdominal wound/dressing, NPWT    Mobility  Bed Mobility Overal bed mobility: Modified Independent Bed Mobility: Sidelying to Sit;Rolling           General bed mobility comments: mod I to sit up to bed edge using rails    Transfers Overall transfer level: Needs assistance Equipment used: Rolling walker (2 wheeled) Transfers: Sit to/from Stand Sit to Stand: Supervision;From elevated surface            Ambulation/Gait Ambulation/Gait assistance: Min guard;Supervision Gait Distance (Feet): 400 Feet Assistive device: Rolling walker (2 wheeled) Gait Pattern/deviations: Step-through pattern     General Gait Details: pt steady with RW   Stairs             Wheelchair Mobility    Modified Rankin (Stroke Patients Only)       Balance                                            Cognition Arousal/Alertness:  Awake/alert Behavior During Therapy: WFL for tasks assessed/performed Overall Cognitive Status: Within Functional Limits for tasks assessed                                        Exercises      General Comments        Pertinent Vitals/Pain Pain Assessment: No/denies pain    Home Living                      Prior Function            PT Goals (current goals can now be found in the care plan section) Progress towards PT goals: Progressing toward goals    Frequency    Min 3X/week      PT Plan Current plan remains appropriate    Co-evaluation              AM-PAC PT "6 Clicks" Mobility   Outcome Measure  Help needed turning from your back to your side while in a flat bed without using bedrails?: A Little Help needed moving from lying on your back to sitting on the side of a flat bed without using bedrails?: A Little Help needed moving to and from a bed to  a chair (including a wheelchair)?: A Little Help needed standing up from a chair using your arms (e.g., wheelchair or bedside chair)?: A Little Help needed to walk in hospital room?: A Little Help needed climbing 3-5 steps with a railing? : A Little 6 Click Score: 18    End of Session   Activity Tolerance: Patient tolerated treatment well Patient left: in chair;with call bell/phone within reach Nurse Communication: Mobility status PT Visit Diagnosis: Difficulty in walking, not elsewhere classified (R26.2)     Time: 1400-1420 PT Time Calculation (min) (ACUTE ONLY): 20 min  Charges:  $Gait Training: 8-22 mins                    Paulino Door, DPT Acute Rehabilitation Services Pager: [phone] Office: [phone]    Maida Sale E 07/17/2021, 3:28 PM

## 2021-07-17 NOTE — Discharge Summary (Signed)
[facility] Discharge Summary   Patient ID: Travis Calderon MRN: [id number] DOB/AGE: 1956/12/16 65 y.o.  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 Freida Busman AKI - resolved  Consultants General surgery, Dr. Freida Busman  Imaging: No results found.  Procedures Dr. Sophronia Simas (06/24/21)  Open sigmoid colectomy with end colostomy for obstructing sigmoid colon mass   Hospital Course:  COVID +  The medical team was involved with his care from the beginning.  He was asymptomatic and did not require any treatment during his stay.  AKI His creatinine was initially normal on presentation but increase to as high as 2.82.  With conservative management, this improved and resolved by time of discharge.  Morbid obesity No intervention  ABL anemia He developed some mild acute blood loss anemia during his stay.  This stabilized out in the mid 10 range with no intervention warranted.  PCM The patient was noted to have protein calorie malnutrition and he was on a diet  during his stay as able after surgery.  He was treated with protein shakes as well as encouraging oral intake.   POD 24 s/p open sigmoid colectomy with end colostomy for obstructing sigmoid colon mass 06/24/21 Dr. Freida Busman The patient underwent the above procedure.  He tolerated this well.  His diet was able to be advanced as tolerated post-operatively as his bowels began to function.  He had a colostomy, open wound, and JP drain in place.  He was treated with zosyn initially.  This was able ot be stopped, but then restarted on 7/21-8/1 secondary to concern for rectal stump leak.  He developed high output from his colostomy.  His WBC was increasing.  His c diff was negative.  He was given fiber as well as Lomotil to help control this output.  His colostomy was noted to have some purulent drainage during his stay which was wicked during pouch change.  This resolved prior to discharge  He then also underwent a CT scan on 7/21 which revealed a rectal stump leak as his JP drain began have more of a thin brown feculent appearing output.  He then had another scan on 7/29 which was overall stable with a similar appearance of his staple line.  His drain remained in place.  His midline wound was open and had a VAC placed during his stay.  He did have complete fascial dehiscence of his wound.  At time of discharge this was beefy red with good granulation tissue.  The patient worked with therapies during his stay and due to deconditioning and overall care, SNF was recommended.  He was medically stable on POD#25 for DC to SNF.  Allergies as of 07/19/2021   No Known Allergies      Medication List     TAKE these medications    acetaminophen 325 MG tablet Commonly known as: TYLENOL Take 2 tablets (650 mg total) by mouth every 6 (six) hours as needed for mild pain or fever.   albuterol (2.5 MG/3ML) 0.083% nebulizer solution Commonly known as: PROVENTIL Take 3 mLs (2.5 mg total) by nebulization every 6 (six)  hours as needed for wheezing.   alum & mag hydroxide-simeth 200-200-20 MG/5ML suspension Commonly known as: MAALOX/MYLANTA Take 15 mLs by mouth every 4 (four) hours as needed for indigestion or heartburn.   diphenhydrAMINE 12.5 MG/5ML elixir Commonly known as: BENADRYL Take 5-10 mLs (12.5-25 mg total) by mouth every 6 (six) hours as needed for itching, allergies or sleep (anxiety).   famotidine 20 MG tablet Commonly known as: PEPCID Take 1 tablet (20 mg total) by mouth daily. Start taking on: July 20, 2021   magnesium oxide 400 (240 Mg) MG tablet Commonly known as: MAG-OX Take 1 tablet (400 mg total) by mouth 2 (two) times daily.   melatonin 3 MG Tabs tablet Take 1 tablet (3 mg total) by mouth at bedtime.   potassium chloride SA 20 MEQ tablet Commonly known as: Klor-Con M20 Take 1 tablet (20 mEq total) by mouth 2 (two) times daily.   psyllium 95 % Pack Commonly known as: HYDROCIL/METAMUCIL Take 1 packet by mouth 2 (two) times daily.               Discharge Care Instructions  (From admission, onward)           Start     Ordered   07/19/21 0000  Discharge wound care:       Comments: Pack strip of aquacel into inferior mucocutaneous separation around stoma with pouch changes.  Continue NPWT to midline M-W-F. Use white foam in wound base and black foam overlying.   07/19/21 1319              Follow-up Information     Fritzi Mandes, MD Follow up on 08/14/2021.   Specialty: General Surgery Why: 3pm, arrive by 2:30pm for paperwork and check in process Contact information: [address] [place] [address] [phone]         [facility] Follow [facility].   Specialty: General Practice Why: As needed Contact information: [address] [place] [address] [phone]                 Signed: Juliet Rude , PA-C [facility] 07/19/2021, 1:20 PM Please see Amion for pager number during day hours  7:00am-4:30pm

## 2021-07-17 NOTE — [place] FL2 (Signed)
[place] MEDICAID FL2 LEVEL OF CARE SCREENING TOOL     IDENTIFICATION  Patient Name: Travis Calderon Birthdate: 02-Oct-1956 Sex: male Admission Date (Current Location): 06/23/2021  [facility] and [place] Number:  Producer, television/film/video and Address:  [facility],  [address] [place] [address]      Provider Number: [id number]  Attending Physician Name and Address:  Montez Morita, Md, MD Gaynelle Adu, MD  Relative Name and Phone Number:  [name]   [phone]  perguese,felisa Sister   [phone]  Louie Casa   [phone]    Current Level of Care: Hospital Recommended Level of Care: Skilled Nursing Facility Prior Approval Number:    Date Approved/Denied:   PASRR Number: [id number]  Discharge Plan: SNF    Current Diagnoses: Patient Active Problem List   Diagnosis Date Noted   Erectile dysfunction 06/24/2021   Achilles tendinitis 06/24/2021   Benign prostatic hyperplasia 06/24/2021   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 06/24/2021   Colostomy in place [facility]) 06/24/2021   MRSA (methicillin resistant Staphylococcus aureus) carrier 06/24/2021   Panniculus 06/24/2021   Colonic mass - sgimoid with obstruction 06/23/2021   Morbid obesity with BMI of 50.0-59.9, adult (HCC) 06/23/2021   Hypokalemia 06/23/2021   COVID-19 virus infection 06/23/2021   Hypertensive heart disease with heart failure (HCC) 04/07/2015   History of colonoscopy 12/17/2005    Orientation RESPIRATION BLADDER Height & Weight     Self, Time, Situation, Place  Normal Continent Weight: (!) 300 lb 7.8 oz (136.3 kg) Height:  5\' 7"  (170.2 cm)  BEHAVIORAL SYMPTOMS/MOOD NEUROLOGICAL BOWEL NUTRITION STATUS      Incontinent Diet (Regular diet)  AMBULATORY STATUS COMMUNICATION OF NEEDS Skin   Limited Assist Verbally Surgical wounds, Wound Vac (Dressing Change 2x a day and has a wound vac.)                       Personal Care Assistance Level of  Assistance  Bathing, Feeding, Dressing Bathing Assistance: Limited assistance Feeding assistance: Independent Dressing Assistance: Limited assistance     Functional Limitations Info  Sight, Hearing, Speech Sight Info: Adequate Hearing Info: Adequate Speech Info: Adequate    SPECIAL CARE FACTORS FREQUENCY  PT (By licensed PT), OT (By licensed OT)     PT Frequency: Minimum 5x a week OT Frequency: Minimum 5x a week            Contractures Contractures Info: Not present    Additional Factors Info  Code Status, Allergies Code Status Info: Full Code Allergies Info: NKA           Current Medications (07/17/2021):  This is the current hospital active medication list Current Facility-Administered Medications  Medication Dose Route Frequency Provider Last Rate Last Admin   0.9 %  sodium chloride infusion   Intravenous PRN 09/16/2021, MD 5 mL/hr at 07/15/21 0459 Infusion Verify at 07/15/21 0459   acetaminophen (TYLENOL) tablet 650 mg  650 mg Oral Q6H PRN 07/17/21, PA-C       albuterol (PROVENTIL) (2.5 MG/3ML) 0.083% nebulizer solution 2.5 mg  2.5 mg Nebulization Q6H PRN MacNeil, Richard G, DO       alum & mag hydroxide-simeth (MAALOX/MYLANTA) 200-200-20 MG/5ML suspension 15 mL  15 mL Oral Q4H PRN 06-25-2001, MD   15 mL at 07/01/21 0949   chlorhexidine (PERIDEX) 0.12 % solution 15 mL  15 mL Mouth Rinse BID 07/03/21, MD   15 mL at 07/17/21 1041  diphenhydrAMINE (BENADRYL) 12.5 MG/5ML elixir 12.5-25 mg  12.5-25 mg Oral Q6H PRN Len Childs T, RPH       famotidine (PEPCID) tablet 20 mg  20 mg Oral Daily Herby Abraham, RPH   20 mg at 07/17/21 1041   heparin injection 5,000 Units  5,000 Units Subcutaneous Q8H Juliet Rude, PA-C   5,000 Units at 07/17/21 0507   HYDROmorphone (DILAUDID) injection 0.5-1 mg  0.5-1 mg Intravenous Q3H PRN Juliet Rude, PA-C   1 mg at 07/12/21 5374   lip balm (CARMEX) ointment 1 application  1 application Topical BID Karie Soda, MD   1 application at 07/16/21 2231   magic mouthwash  15 mL Oral QID PRN Karie Soda, MD   15 mL at 07/05/21 2230   magnesium oxide (MAG-OX) tablet 400 mg  400 mg Oral BID Dorcas Carrow, MD   400 mg at 07/17/21 1041   MEDLINE mouth rinse  15 mL Mouth Rinse BID Cipriano Bunker, MD   15 mL at 07/17/21 1041   melatonin tablet 3 mg  3 mg Oral QHS Luiz Iron, NP   3 mg at 07/16/21 2227   menthol-cetylpyridinium (CEPACOL) lozenge 3 mg  1 lozenge Oral PRN Karie Soda, MD       metoprolol tartrate (LOPRESSOR) injection 5 mg  5 mg Intravenous Q6H PRN Karie Soda, MD       ondansetron [facility]) tablet 4 mg  4 mg Oral Q6H PRN Laqueta Due, DO       Or   ondansetron [facility]) injection 4 mg  4 mg Intravenous Q6H PRN Laqueta Due, DO   4 mg at 06/27/21 0746   oxyCODONE (Oxy IR/ROXICODONE) immediate release tablet 5-10 mg  5-10 mg Oral Q4H PRN Juliet Rude, PA-C       phenol (CHLORASEPTIC) mouth spray 2 spray  2 spray Mouth/Throat PRN Karie Soda, MD       potassium chloride (KLOR-CON) packet 20 mEq  20 mEq Oral BID Vann, Jessica U, DO   20 mEq at 07/17/21 1039   prochlorperazine (COMPAZINE) injection 5-10 mg  5-10 mg Intravenous Q4H PRN Karie Soda, MD       psyllium (HYDROCIL/METAMUCIL) 1 packet  1 packet Oral BID Kinsinger, De Blanch, MD   1 packet at 07/17/21 1040   simethicone (MYLICON) 40 MG/0.6ML suspension 80 mg  80 mg Oral QID PRN Karie Soda, MD         Discharge Medications: Please see discharge summary for a list of discharge medications.  Relevant Imaging Results:  Relevant Lab Results:   Additional Information SSN [id number]  Darleene Cleaver, LCSW

## 2021-07-17 NOTE — Consult Note (Signed)
WOC Nurse wound follow up Patient receiving care in [facility] 1426. Primary RN, Ina Kick, present at time of encounter. Wound type: abdominal surgical incision Measurement: 24 cm x 12.5 cm x 7 cm with undermining of 5 cm at 4 o'clock, and 1 cm from 11 to 1 o'clock Wound bed: 100% beefy red Drainage (amount, consistency, odor) none at time of VAC application Periwound: intact Dressing procedure/placement/frequency: one whole white foam was cut into 3 pieces and placed into undermining along wound borders, and main wound bed. Then one piece of black foam placed over everything. Drape applied, immediate seal obtained at 125 mm Hg. Black foam requested by [place] for Wednesday.  WOC Nurse ostomy follow up Stoma type/location: LUQ colostomy Stomal assessment/size: opening cut to 1 3/4 inches Peristomal assessment: Mucocutaneous separation (MCS) from 4 to 8 o'clock that measures 2.2 cm in depth---draining yellow pus.  Also, yellow pus bubbled out at roughly 11 o'clock, but no MCS or opening noted at that location.   A small piece of Aquacel was tucked into the MCS pocket. Treatment options for stomal/peristomal skin: barrier ring Output: pudding consistency brown feces in existing pouch Ostomy pouching: 2pc. 2 and 3/4 inches flat system in use.  More supplies ordered by [place] for placement in room. Education provided:  Enrolled patient in [employer] Discharge program: Yes, previously.    Patient to go to SNF at time of discharge.  Helmut Muster, RN, MSN, CWOCN, CNS-BC, pager [phone]

## 2021-07-17 NOTE — Progress Notes (Signed)
Progress Note  23 Days Post-Op  Subjective: Patient reports pain well controlled. Tolerating diet and having colostomy output. He asked about when he might be considered for colostomy reversal and we discussed that will be more definitively determined at surgical follow up but that 6-12 months is not an unreasonable timeline.   Objective: Vital signs in last 24 hours: Temp:  [97.6 F (36.4 C)-98.3 F (36.8 C)] 97.6 F (36.4 C) (08/01 0503) Pulse Rate:  [73-81] 73 (08/01 0503) Resp:  [20] 20 (08/01 0503) BP: (93-109)/(60-81) 102/67 (08/01 0503) SpO2:  [97 %-100 %] 97 % (08/01 0503) Weight:  [136.3 kg] 136.3 kg (08/01 0530) Last BM Date: 07/16/21 (colostomy)  Intake/Output from previous day: 07/31 0701 - 08/01 0700 In: -  Out: 2297 [LGXQJ:1941; Drains:3] Intake/Output this shift: No intake/output data recorded.  PE: General: pleasant, WD, obese male who is laying in bed in NAD Heart: regular, rate, and rhythm.  Lungs: Respiratory effort nonlabored Abd: soft, NT, ND, stoma viable with soft brown stool, drain in RLQ with thin serous fluid, midline wound with beefy red granulation tissue, scant serous drainage Psych: A&Ox3 with an appropriate affect.    Lab Results:  Recent Labs    07/15/21 0410 07/16/21 0403  WBC 15.2* 10.0  HGB 11.5* 10.5*  HCT 33.5* 31.0*  PLT 251 202   BMET Recent Labs    07/15/21 0410  NA 135  K 3.6  CL 103  CO2 19*  GLUCOSE 88  BUN 7*  CREATININE 1.02  CALCIUM 7.7*   PT/INR No results for input(s): LABPROT, INR in the last 72 hours. CMP     Component Value Date/Time   NA 135 07/15/2021 0410   K 3.6 07/15/2021 0410   CL 103 07/15/2021 0410   CO2 19 (L) 07/15/2021 0410   GLUCOSE 88 07/15/2021 0410   BUN 7 (L) 07/15/2021 0410   CREATININE 1.02 07/15/2021 0410   CALCIUM 7.7 (L) 07/15/2021 0410   PROT 7.4 06/24/2021 0247   ALBUMIN 3.1 (L) 06/24/2021 0247   AST 12 (L) 06/24/2021 0247   ALT 11 06/24/2021 0247   ALKPHOS 69  06/24/2021 0247   BILITOT 1.3 (H) 06/24/2021 0247   GFRNONAA >60 07/15/2021 0410   Lipase     Component Value Date/Time   LIPASE 22 06/23/2021 1109       Studies/Results: No results found.  Anti-infectives: Anti-infectives (From admission, onward)    Start     Dose/Rate Route Frequency Ordered Stop   07/06/21 1300  piperacillin-tazobactam (ZOSYN) IVPB 3.375 g        3.375 g 12.5 mL/hr over 240 Minutes Intravenous Every 8 hours 07/06/21 1235     06/24/21 2200  cefoTEtan (CEFOTAN) 2 g in sodium chloride 0.9 % 100 mL IVPB        2 g 200 mL/hr over 30 Minutes Intravenous Every 12 hours 06/24/21 1514 06/24/21 2109   06/24/21 0830  cefoTEtan (CEFOTAN) 2 g in sodium chloride 0.9 % 100 mL IVPB        2 g 200 mL/hr over 30 Minutes Intravenous On call to O.R. 06/24/21 0733 06/24/21 1117        Assessment/Plan POD 23 s/p open sigmoid colectomy with end colostomy for obstructing sigmoid colon mass 06/24/21 Dr. Freida Busman - Pathology consistent with diverticulitis. No evidence of malignancy  - CT 7/21 w/ rectal stump leak. Continue drain and abx. Drain SS today.  - CT 7/29: similar appearance at staple line, small L paracolic gutter  fluid collection, no signs of ECF - WBC normal yesterday - replace VAC to midline - change M/W/F - Mobilize, PT/OT  - Awaiting SNF - medically stable for discharge     FEN: CM. IVF per TRH VTE: SCDs, SQH ID: cefotetan x2 doses. Zosyn 7/21 >8/1   ABL anemia - stable COVID + - off precautions Morbid Obesity - BMI 47.06 BPH   LOS: 24 days    Juliet Rude, [facility] 07/17/2021, 11:40 AM Please see Amion for pager number during day hours 7:00am-4:30pm

## 2021-07-17 NOTE — TOC Progression Note (Addendum)
Transition of Care [facility]) - Progression Note    Patient Details  Name: Jaydn Moscato MRN: [id number] Date of Birth: 29-Apr-1956  Transition of Care [facility]) CM/SW Contact  Darleene Cleaver, [place] Phone Number: 07/17/2021, 10:54 AM  Clinical Narrative:     [name] called, and she chose [facility] for SNF rehab.  CSW to updated FL2 and refax out patient's information.  11:40am CSW contacted [facility], and informed them that sister chose facility.  They can accept once insurance Berkley Harvey has been approved.  4:51pm  CSW started insurance authorization for patient.  Fransico Him id number is [id number], CSW spent time on the phone with patient's sister and explained to her the process of getting insurance authorization.  CSW also informed her that there is a chance that patient may not be approved for rehab.  CSW explained to her what the options are if patient is denied.  CSW told her they can appeal the decision, go home with home health, or pay privately.  CSW to continue to follow patient's progress throughout discharge planning.        Expected Discharge Plan and Services      SNF                                           Social Determinants of Health (SDOH) Interventions    Readmission Risk Interventions No flowsheet data found.

## 2021-07-18 NOTE — Progress Notes (Signed)
Occupational Therapy Treatment Patient Details Name: Travis Calderon MRN: [id number] DOB: 1956-06-22 Today's Date: 07/18/2021    History of present illness This 65 year old male with PMH significant for obesity, diverticulosis presented to the ED 06/23/21 with complaints of abdominal pain and abnormal bowel movements.He is found to be COVID-positive, CT abdomen shows large obstructing colonic mass.  Patient underwent Open sigmoid colectomy with end colostomy.on 06/24/21.  Incisional wound VAC to be placed 07/12/21   OT comments  Patient was approached later for session with patient in agreement to participate. Patient completed bathing and drying tasks of UB and LB above knees in standing at sink with supervision. Patient is making good progress towards goals. Patient continued to endorse fatigue after activity.   Follow Up Recommendations  Home health OT;SNF    Equipment Recommendations  Other (comment)    Recommendations for Other Services      Precautions / Restrictions Precautions Precautions: Fall Precaution Comments: colostomy,  right JP drain, urinary urgency, large abdominal wound/dressing, NPWT       Mobility Bed Mobility                    Transfers                      Balance Overall balance assessment: Needs assistance Sitting-balance support: No upper extremity supported Sitting balance-Leahy Scale: Good     Standing balance support: During functional activity Standing balance-Leahy Scale: Fair                             ADL either performed or assessed with clinical judgement   ADL       Grooming: Wash/dry face;Oral care;Set up;Sitting   Upper Body Bathing: Standing;Supervision/ safety   Lower Body Bathing: Supervison/ safety;Sit to/from stand Lower Body Bathing Details (indicate cue type and reason): patient had difficulty reaching below knees educated on using long handled sponge. Upper Body Dressing : Set up;Sitting        Toilet Transfer: Min guard;RW;Ambulation;Comfort height toilet           Functional mobility during ADLs: Supervision/safety;Rolling walker General ADL Comments: patient was able to complete majoirty of bathing/drying task standing at sink in bathroom with patietn needing to sit down at end to brush teeth.     Vision Patient Visual Report: No change from baseline     Perception     Praxis      Cognition Arousal/Alertness: Awake/alert Behavior During Therapy: WFL for tasks assessed/performed Overall Cognitive Status: Within Functional Limits for tasks assessed                                          Exercises     Shoulder Instructions       General Comments      Pertinent Vitals/ Pain       Pain Assessment: No/denies pain  Home Living                                          Prior Functioning/Environment              Frequency  Min 2X/week        Progress Toward Goals  OT Goals(current  goals can now be found in the care plan section)  Progress towards OT goals: Progressing toward goals  Acute Rehab OT Goals Patient Stated Goal: return to independence OT Goal Formulation: With patient/family Time For Goal Achievement: 07/26/21 Potential to Achieve Goals: Good  Plan Discharge plan needs to be updated    Co-evaluation                 AM-PAC OT "6 Clicks" Daily Activity     Outcome Measure   Help from another person eating meals?: None Help from another person taking care of personal grooming?: A Little Help from another person toileting, which includes using toliet, bedpan, or urinal?: A Little Help from another person bathing (including washing, rinsing, drying)?: A Little Help from another person to put on and taking off regular upper body clothing?: A Little Help from another person to put on and taking off regular lower body clothing?: A Little 6 Click Score: 19    End of Session Equipment  Utilized During Treatment: Rolling walker  OT Visit Diagnosis: Other abnormalities of gait and mobility (R26.89);Pain   Activity Tolerance Patient tolerated treatment well   Patient Left in chair;with call bell/phone within reach;with family/visitor present   Nurse Communication          Time: 1660-6301 OT Time Calculation (min): 25 min  Charges: OT General Charges $OT Visit: 1 Visit OT Treatments $Self Care/Home Management : 23-37 mins  Sharyn Blitz OTR/L, MS Acute Rehabilitation Department Office# [phone] Pager# [phone]    [facility] Guest Layann Bluett 07/18/2021, 12:53 PM

## 2021-07-18 NOTE — Progress Notes (Signed)
Patient did very well emptying and managing his colostomy bag several times today.

## 2021-07-18 NOTE — Progress Notes (Signed)
OT Cancellation Note  Patient Details Name: Travis Calderon MRN: [id number] DOB: [birth year]   Cancelled Treatment:    Reason Eval/Treat Not Completed: Patient declined, no reason specified patient reported he had company. Will attempt to check back later. Sharyn Blitz OTR/L, MS Acute Rehabilitation Department Office# [phone] Pager# [phone]   [facility] Guest Shi Blankenship 07/18/2021, 8:56 AM

## 2021-07-18 NOTE — Progress Notes (Signed)
Progress Note  24 Days Post-Op  Subjective: Patient remains surgically stable.   Objective: Vital signs in last 24 hours: Temp:  [98 F (36.7 C)-98.2 F (36.8 C)] 98.2 F (36.8 C) (08/02 0520) Pulse Rate:  [71-88] 86 (08/02 0520) Resp:  [18-20] 20 (08/02 0520) BP: (99-109)/(62-69) 99/69 (08/02 0520) SpO2:  [92 %-100 %] 99 % (08/02 0520) Last BM Date: 07/17/21  Intake/Output from previous day: 08/01 0701 - 08/02 0700 In: -  Out: 1975 [Urine:1975] Intake/Output this shift: Total I/O In: 120 [P.O.:120] Out: -   PE: General: pleasant, WD, obese male who is laying in bed in NAD Heart: regular, rate, and rhythm. Lungs: Respiratory effort nonlabored Abd: soft, NT, ND, stoma viable with soft brown stool, drain in RLQ with thin serous fluid, VAC to midline with good seal and ss drainage in cannister Psych: A&Ox3 with an appropriate affect.   Lab Results:  Recent Labs    07/16/21 0403  WBC 10.0  HGB 10.5*  HCT 31.0*  PLT 202   BMET No results for input(s): NA, K, CL, CO2, GLUCOSE, BUN, CREATININE, CALCIUM in the last 72 hours. PT/INR No results for input(s): LABPROT, INR in the last 72 hours. CMP     Component Value Date/Time   NA 135 07/15/2021 0410   K 3.6 07/15/2021 0410   CL 103 07/15/2021 0410   CO2 19 (L) 07/15/2021 0410   GLUCOSE 88 07/15/2021 0410   BUN 7 (L) 07/15/2021 0410   CREATININE 1.02 07/15/2021 0410   CALCIUM 7.7 (L) 07/15/2021 0410   PROT 7.4 06/24/2021 0247   ALBUMIN 3.1 (L) 06/24/2021 0247   AST 12 (L) 06/24/2021 0247   ALT 11 06/24/2021 0247   ALKPHOS 69 06/24/2021 0247   BILITOT 1.3 (H) 06/24/2021 0247   GFRNONAA >60 07/15/2021 0410   Lipase     Component Value Date/Time   LIPASE 22 06/23/2021 1109       Studies/Results: No results found.  Anti-infectives: Anti-infectives (From admission, onward)    Start     Dose/Rate Route Frequency Ordered Stop   07/06/21 1300  piperacillin-tazobactam (ZOSYN) IVPB 3.375 g  Status:   Discontinued        3.375 g 12.5 mL/hr over 240 Minutes Intravenous Every 8 hours 07/06/21 1235 07/17/21 1159   06/24/21 2200  cefoTEtan (CEFOTAN) 2 g in sodium chloride 0.9 % 100 mL IVPB        2 g 200 mL/hr over 30 Minutes Intravenous Every 12 hours 06/24/21 1514 06/24/21 2109   06/24/21 0830  cefoTEtan (CEFOTAN) 2 g in sodium chloride 0.9 % 100 mL IVPB        2 g 200 mL/hr over 30 Minutes Intravenous On call to O.R. 06/24/21 0733 06/24/21 1117        Assessment/Plan POD 24 s/p open sigmoid colectomy with end colostomy for obstructing sigmoid colon mass 06/24/21 Dr. Freida Busman - Pathology consistent with diverticulitis. No evidence of malignancy  - CT 7/21 w/ rectal stump leak. Continue drain and abx. Drain SS today.  - CT 7/29: similar appearance at staple line, small L paracolic gutter fluid collection, no signs of ECF - WBC normalized - abx stopped yesterday - continue VAC to midline - change M/W/F - Mobilize, PT/OT  - Awaiting SNF - medically stable for discharge     FEN: CM diet  VTE: SCDs, SQH ID: cefotetan x2 doses. Zosyn 7/21 >8/1   ABL anemia - stable COVID + - off precautions Morbid Obesity -  BMI 47.06 BPH   LOS: 25 days    Juliet Rude, [facility] Surgery 07/18/2021, 12:09 PM Please see Amion for pager number during day hours 7:00am-4:30pm

## 2021-07-18 NOTE — Progress Notes (Addendum)
Mobility Specialist - Progress Note    07/18/21 1638  Mobility  Activity Ambulated in hall  Level of Assistance Contact guard assist, steadying assist  Assistive Device Front wheel walker  Distance Ambulated (ft) 350 ft  Mobility Ambulated with assistance in hallway  Mobility Response Tolerated well  Mobility performed by Mobility specialist  $Mobility charge 1 Mobility    Upon entry pt was sitting in recliner. Pt required steadying A when standing in order to stabilize prior to ambulating. Pt walked ~350 ft in hallway while using RW. Pt tolerated session well and did not c/o of pain or dizziness, but SOB was present. Pt required 1 standing rest break and kept a good pace while walking. Pt returned to recliner after session and was left with call bell at side and drains reconnected to power source.  Arliss Journey Mobility Specialist Acute Rehabilitation Services Phone: [phone] 07/18/21, 4:43 PM

## 2021-07-18 NOTE — Progress Notes (Signed)
Nurse Roderick Pee encouraged patient to empty colostomy bag independently.  Patient was able to empty the colostomy bag on his own without assistance.  Will continue to monitor.

## 2021-07-18 NOTE — TOC Progression Note (Addendum)
Transition of Care [facility]) - Progression Note    Patient Details  Name: Travis Calderon MRN: [id number] Date of Birth: 18-Jul-1956  [facility] CM/SW Contact  Darleene Cleaver, [place] Phone Number: 07/18/2021, 2:12 PM  Clinical Narrative:    Updated clinicals faxed to patient's insurance company.  CSW also spoke to [place], if patient is denied by [employer] for SNF placement, they can accept patient.  CSW to continue to follow patient's progress throughout discharge planning.  Insurance company requested extra clinicals awaiting insurance authorization.        Expected Discharge Plan and Services                                                 Social Determinants of Health (SDOH) Interventions    Readmission Risk Interventions No flowsheet data found.

## 2021-07-19 MED ORDER — MELATONIN 3 MG PO TABS: 3.0000 mg | ORAL_TABLET | Freq: Every day | ORAL | 0 refills | Status: AC

## 2021-07-19 MED ORDER — PSYLLIUM 95 % PO PACK: 1.0000 | PACK | Freq: Two times a day (BID) | ORAL | Status: AC

## 2021-07-19 MED ORDER — COVID-19 MRNA VACC (MODERNA) 50 MCG/0.25ML IM SUSP
0.2500 mL | Freq: Once | INTRAMUSCULAR | Status: AC
  Administered 2021-07-19: 0.25 mL via INTRAMUSCULAR
  Filled 2021-07-19: qty 0.25

## 2021-07-19 MED ORDER — ACETAMINOPHEN 325 MG PO TABS: 650.0000 mg | ORAL_TABLET | Freq: Four times a day (QID) | ORAL | Status: AC | PRN

## 2021-07-19 MED ORDER — FAMOTIDINE 20 MG PO TABS: 20.0000 mg | ORAL_TABLET | Freq: Every day | ORAL | Status: AC

## 2021-07-19 MED ORDER — ALUM & MAG HYDROXIDE-SIMETH 200-200-20 MG/5ML PO SUSP: 15.0000 mL | ORAL | 0 refills | Status: AC | PRN

## 2021-07-19 MED ORDER — DIPHENHYDRAMINE HCL 12.5 MG/5ML PO ELIX: 12.5000 mg | ORAL_SOLUTION | Freq: Four times a day (QID) | ORAL | 0 refills | Status: AC | PRN

## 2021-07-19 MED ORDER — POTASSIUM CHLORIDE CRYS ER 20 MEQ PO TBCR: 20.0000 meq | EXTENDED_RELEASE_TABLET | Freq: Two times a day (BID) | ORAL | Status: AC

## 2021-07-19 MED ORDER — MAGNESIUM OXIDE -MG SUPPLEMENT 400 (240 MG) MG PO TABS: 400.0000 mg | ORAL_TABLET | Freq: Two times a day (BID) | ORAL | Status: AC

## 2021-07-19 MED ORDER — ALBUTEROL SULFATE (2.5 MG/3ML) 0.083% IN NEBU: 2.5000 mg | INHALATION_SOLUTION | Freq: Four times a day (QID) | RESPIRATORY_TRACT | 12 refills | Status: AC | PRN

## 2021-07-19 NOTE — Plan of Care (Signed)
  Problem: Health Behavior/Discharge Planning: Goal: Ability to manage health-related needs will improve Outcome: Adequate for Discharge   Problem: Clinical Measurements: Goal: Ability to maintain clinical measurements within normal limits will improve Outcome: Adequate for Discharge Goal: Will remain free from infection Outcome: Adequate for Discharge Goal: Diagnostic test results will improve Outcome: Adequate for Discharge Goal: Respiratory complications will improve Outcome: Adequate for Discharge Goal: Cardiovascular complication will be avoided Outcome: Adequate for Discharge   Problem: Activity: Goal: Risk for activity intolerance will decrease Outcome: Adequate for Discharge   Problem: Nutrition: Goal: Adequate nutrition will be maintained Outcome: Adequate for Discharge   Problem: Coping: Goal: Level of anxiety will decrease Outcome: Adequate for Discharge   Problem: Elimination: Goal: Will not experience complications related to bowel motility Outcome: Adequate for Discharge Goal: Will not experience complications related to urinary retention Outcome: Adequate for Discharge   Problem: Pain Managment: Goal: General experience of comfort will improve Outcome: Adequate for Discharge   Problem: Safety: Goal: Ability to remain free from injury will improve Outcome: Adequate for Discharge   Problem: Skin Integrity: Goal: Risk for impaired skin integrity will decrease Outcome: Adequate for Discharge   Problem: Education: Goal: Knowledge of risk factors and measures for prevention of condition will improve Outcome: Adequate for Discharge   Problem: Coping: Goal: Psychosocial and spiritual needs will be supported Outcome: Adequate for Discharge   Problem: Respiratory: Goal: Will maintain a patent airway Outcome: Adequate for Discharge Goal: Complications related to the disease process, condition or treatment will be avoided or minimized Outcome: Adequate for  Discharge   Problem: Education: Goal: Required Educational Video(s) Outcome: Adequate for Discharge   Problem: Clinical Measurements: Goal: Ability to maintain clinical measurements within normal limits will improve Outcome: Adequate for Discharge Goal: Postoperative complications will be avoided or minimized Outcome: Adequate for Discharge   Problem: Skin Integrity: Goal: Demonstration of wound healing without infection will improve Outcome: Adequate for Discharge

## 2021-07-19 NOTE — Progress Notes (Signed)
Progress Note  25 Days Post-Op  Subjective: Abdomen stable. Awaiting decision on SNF - wound care is complex and it is the feeling of the surgical team and WOC that he would benefit from SNF at least initially from a wound care standpoint.   Objective: Vital signs in last 24 hours: Temp:  [98.2 F (36.8 C)-98.7 F (37.1 C)] 98.2 F (36.8 C) (08/03 0402) Pulse Rate:  [76-78] 77 (08/03 0402) Resp:  [16-20] 20 (08/03 0402) BP: (100-105)/(64-66) 105/66 (08/03 0402) SpO2:  [97 %-99 %] 97 % (08/03 0402) Weight:  [138.4 kg] 138.4 kg (08/03 0500) Last BM Date: 07/18/21  Intake/Output from previous day: 08/02 0701 - 08/03 0700 In: 840 [P.O.:840] Out: 2591 [Urine:2400; Drains:190; Stool:1] Intake/Output this shift: No intake/output data recorded.  PE: General: pleasant, WD, obese male who is laying in bed in NAD Heart: regular, rate, and rhythm. Lungs: Respiratory effort nonlabored Abd: soft, NT, ND, stoma viable with soft brown stool and some mucocutaneous separation inferiorly, drain in RLQ with thin serous fluid, midline wound as noted below with beefy red granulation tissue and undermining of 1 cm at 12 o'clock, undermining 5 cm at 4 o'clock   Psych: A&Ox3 with an appropriate affect.   Lab Results:  No results for input(s): WBC, HGB, HCT, PLT in the last 72 hours. BMET No results for input(s): NA, K, CL, CO2, GLUCOSE, BUN, CREATININE, CALCIUM in the last 72 hours. PT/INR No results for input(s): LABPROT, INR in the last 72 hours. CMP     Component Value Date/Time   NA 135 07/15/2021 0410   K 3.6 07/15/2021 0410   CL 103 07/15/2021 0410   CO2 19 (L) 07/15/2021 0410   GLUCOSE 88 07/15/2021 0410   BUN 7 (L) 07/15/2021 0410   CREATININE 1.02 07/15/2021 0410   CALCIUM 7.7 (L) 07/15/2021 0410   PROT 7.4 06/24/2021 0247   ALBUMIN 3.1 (L) 06/24/2021 0247   AST 12 (L) 06/24/2021 0247   ALT 11 06/24/2021 0247   ALKPHOS 69 06/24/2021 0247   BILITOT 1.3 (H) 06/24/2021 0247    GFRNONAA >60 07/15/2021 0410   Lipase     Component Value Date/Time   LIPASE 22 06/23/2021 1109       Studies/Results: No results found.  Anti-infectives: Anti-infectives (From admission, onward)    Start     Dose/Rate Route Frequency Ordered Stop   07/06/21 1300  piperacillin-tazobactam (ZOSYN) IVPB 3.375 g  Status:  Discontinued        3.375 g 12.5 mL/hr over 240 Minutes Intravenous Every 8 hours 07/06/21 1235 07/17/21 1159   06/24/21 2200  cefoTEtan (CEFOTAN) 2 g in sodium chloride 0.9 % 100 mL IVPB        2 g 200 mL/hr over 30 Minutes Intravenous Every 12 hours 06/24/21 1514 06/24/21 2109   06/24/21 0830  cefoTEtan (CEFOTAN) 2 g in sodium chloride 0.9 % 100 mL IVPB        2 g 200 mL/hr over 30 Minutes Intravenous On call to O.R. 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 Freida Busman - Pathology consistent with diverticulitis. No evidence of malignancy  - CT 7/21 w/ rectal stump leak. Continue drain and abx. Drain SS today.  - CT 7/29: similar appearance at staple line, small L paracolic gutter fluid collection, no signs of ECF - continue VAC to midline - change M/W/F -  packing separation in stoma with aquacel  - Mobilize, PT/OT  - Awaiting SNF - medically stable for discharge; if unable to go to SNF will need to set up home health RN and therapies. HH RN would need to be able to change VAC and ostomy at the same time. Patient currently unable to independently change dressing and stoma due to complexity of wound care and overlapping.     FEN: CM diet  VTE: SCDs, SQH ID: cefotetan x2 doses. Zosyn 7/21 >8/1   ABL anemia - stable COVID + - off precautions Morbid Obesity - BMI 47.06 BPH   LOS: 26 days    Juliet Rude, [facility] Surgery 07/19/2021, 9:24 AM Please see Amion for pager number during day hours 7:00am-4:30pm

## 2021-07-19 NOTE — Progress Notes (Signed)
Patient was COVID+ on July 8th, 2022.  OK to proceed with Covid booster administration per Marcelino Duster, PharmD.

## 2021-07-19 NOTE — Progress Notes (Signed)
Report given to RN at [facility] 

## 2021-07-19 NOTE — Plan of Care (Signed)
  Problem: Health Behavior/Discharge Planning: Goal: Ability to manage health-related needs will improve Outcome: Progressing   Problem: Safety: Goal: Ability to remain free from injury will improve Outcome: Progressing   Problem: Activity: Goal: Risk for activity intolerance will decrease Outcome: Adequate for Discharge   Problem: Nutrition: Goal: Adequate nutrition will be maintained Outcome: Adequate for Discharge

## 2021-07-19 NOTE — Progress Notes (Signed)
Attempted report to [facility] x1.  Received DON voicemail. Will try again shortly

## 2021-07-19 NOTE — Consult Note (Signed)
WOC Nurse wound follow up Patient receiving care in [facility]26. PA, Adin Hector, at bedside for wound assessment at time of VAC change. Wound type: surgical Measurement: measured 2 days ago Wound bed: beefy red Drainage (amount, consistency, odor) serosanginous in cannister Periwound: intact Dressing procedure/placement/frequency: All pieces of white and black foam removed from wound bed and areas at 12 and 4 o'clock. White foam tucked into these areas, then the wound bed filled with white foam (for a total of 3 clearly visible pieces of white foam), and topped with one piece of black foam. Drape applied, immediate seal obtained. Patient tolerated the procedure without difficulty.  WOC Nurse ostomy follow up Before the [facility] dressing was removed, the patient successfully emptied his ostomy pouch without any difficulty. Stoma type/location: LUQ colostomy Stomal assessment/size: 1 3/4 inches, round, moist, red Peristomal assessment: intact Treatment options for stomal/peristomal skin: mucocutaneous separation (MCS) filled with a strip of Aquacel before new pouching system placed.  Barrier ring applied. Output: large amount of tan soft, formed stool Ostomy pouching: 2pc. 2 and 3/4 inches, Lawson #2 for skin barrier; [place] [phone] for pouch; [place] [phone] for barrier ring Education provided:   In addition to emptying his pouch the patient performed the following ostomy care activities in placing the new pouching system: Cut the opening to the appropriate size on the new skin barrier. Stretched the barrier ring for placement around the stoma. Observed me packing the strip of Aquacel into the MCS pocket. Observed me placing the barrier ring around the stoma, and over the "tail" of the Aquacel.  The patient cannot visualize the MCS area, nor see the entire stoma to place the barrier ring around it. I guided his hands in placement of the new skin barrier over the barrier ring, again, due to visualization  issues.  He removed the backing on the tape border and smoothed to his skin. He successfully snapped the pouch onto the skin barrier and closed the pouch.  It is important to note that the patient cannot perform every step of the ostomy pouching system application due to his inability to visualize the area.  Also, there is not a way to successfully make the [facility] dressing application and the ostomy pouch changes separate processes.  This is due to the fact that the drape for the [facility] must be placed and seal obtained, THEN the ostomy pouching system can be applied.  In effect, every time the [facility] dressing is changed, the ostomy appliance must be changed due to the proximity of one to the other.  The ostomy skin barrier is placed over the drape of the VAC.  INDEPENDENT OSTOMY CARE IN THE HOME ENVIRONMENT WILL BE IMPOSSIBLE FOR THIS PATIENT TO ACHIEVE AT THIS TIME FOR THESE REASONS.   I highly recommend a SNF placement if one can be obtained. Enrolled patient in [place] Secure Start Discharge program: Yes, previously.  The patient wanted me to speak with his sister, Burna Mortimer, at the beginning of my session over his telephone, which I did.  Ostomy supplies in room, as well as, white foams. Helmut Muster, RN, MSN, CWOCN, CNS-BC, pager [phone]

## 2021-07-19 NOTE — TOC Transition Note (Addendum)
Transition of Care [facility]) - CM/SW Discharge Note   Patient Details  Name: Travis Calderon MRN: [id number] Date of Birth: [birth year]  Transition of Care [facility] CM/SW Contact:  Darleene Cleaver, LCSW Phone Number: 07/19/2021, 2:55 PM   Clinical Narrative:     CSW was informed that SNF will need patient to receive his Covid Booster.  CSW contacted attending PA, and he was able to get it today.  CSW updated SNF, CSW also received copy of patient's vaccination record for Covid faxed from the [place].  CSW put a copy in shadow chart and also a hard copy in patient's discharge packet.    Patient to be d/c'ed today to [facility].  Patient and family agreeable to plans will transport via ems RN to call report [phone].  Patient's sister was notified of planned discharge for today.     Final next level of care: Skilled Nursing Facility Barriers to Discharge: Barriers Resolved   Patient Goals and CMS Choice Patient states their goals for this hospitalization and ongoing recovery are:: Patient plans to go to SNF for short term rehab, then return back home. CMS Medicare.gov Compare Post Acute Care list provided to:: Patient Represenative (must comment) Choice offered to / list presented to : Sibling  Discharge Placement PASRR number recieved: 07/17/21            Patient chooses bed at: [facility] Patient to be transferred to facility by: [facility] Name of family member notified: [name]   [phone] Patient and family notified of of transfer: 07/19/21  Discharge Plan and Services                                     Social Determinants of Health (SDOH) Interventions     Readmission Risk Interventions No flowsheet data found.

## 2021-07-19 NOTE — TOC Progression Note (Addendum)
Transition of Care [facility]) - Progression Note    Patient Details  Name: Travis Calderon MRN: [id number] Date of Birth: 12-Jan-1956  [facility]) CM/SW Contact  Darleene Cleaver, [place] Phone Number: 07/19/2021, 10:07 AM  Clinical Narrative:     CSW spoke to patient's insurance company.  Insurance is still pending, and may need a peer to peer with their Wellsite geologist.  CSW awaiting to hear back from insurance company.   10:45am CSW received phone call from insurance company the Wellsite geologist is requesting a peer to peer.  CSW updated surgery PA, and patient's sister Travis Calderon.  CSW to continue to facilitate discharge planning.    Expected Discharge Plan and Services                                                 Social Determinants of Health (SDOH) Interventions    Readmission Risk Interventions No flowsheet data found.

## 2021-07-19 NOTE — Progress Notes (Signed)
Attempted to call report to [facility] x2.  Was transferred, and phone kept ringing, no one picked up.  Will attempt again shortly

## 2023-07-10 IMAGING — CT CT ABD-PELV W/ CM
2 of 5 series · 15 of 46 positions shown, 17 images · IV contrast (APPLIED)
Comparison: 07/05/2021

CLINICAL DATA: Purulent drainage from midline wound. Postoperative
fever. Abdominal pain.

EXAM:
CT ABDOMEN AND PELVIS WITH CONTRAST
TECHNIQUE: Multidetector CT imaging of the abdomen and pelvis was performed
using the standard protocol following bolus administration of
intravenous contrast.
CONTRAST:  100mL OMNIPAQUE IOHEXOL 350 MG/ML SOLN

[Series 2: axial st · axial · 0.98mm/px · z∈[-526,-91]mm · 12 of 101 slices shown, 14 images]
[im 7/101  soft-tissue]
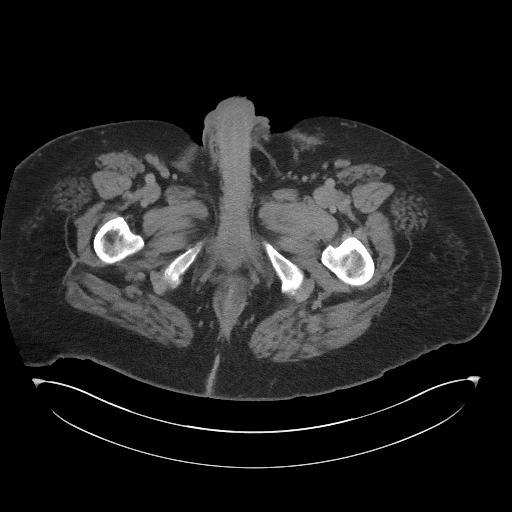
[im 7/101  bone]
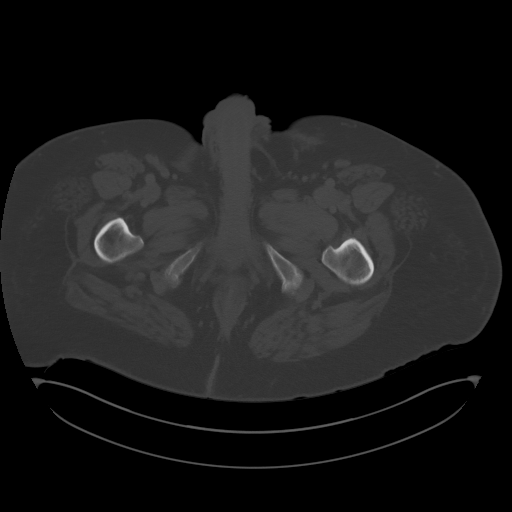
[im 14/101  soft-tissue]
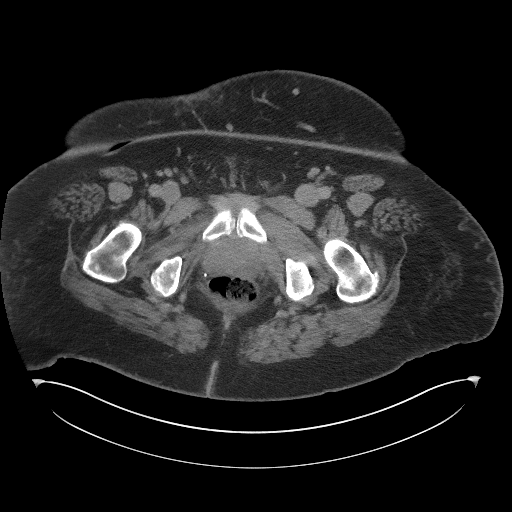
[im 21/101  soft-tissue]
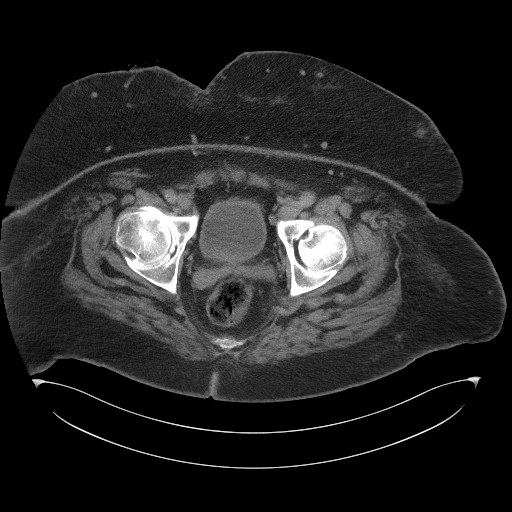
[im 34/101  soft-tissue]
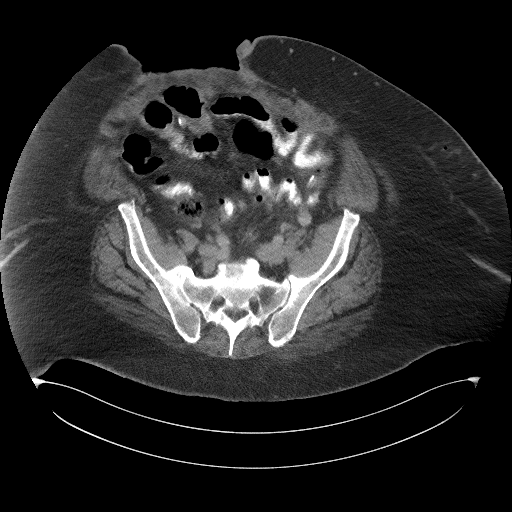
[im 41/101  soft-tissue]
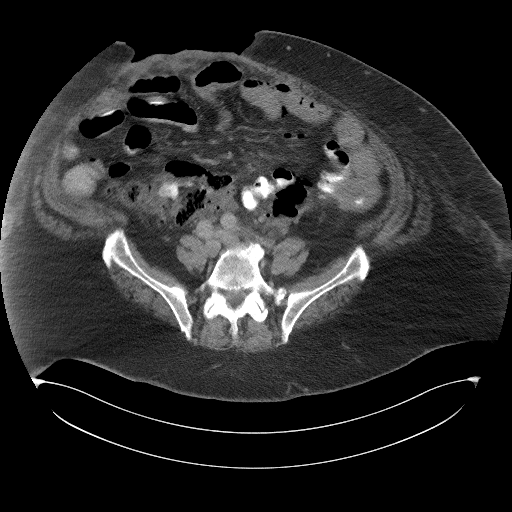
[im 47/101  soft-tissue]
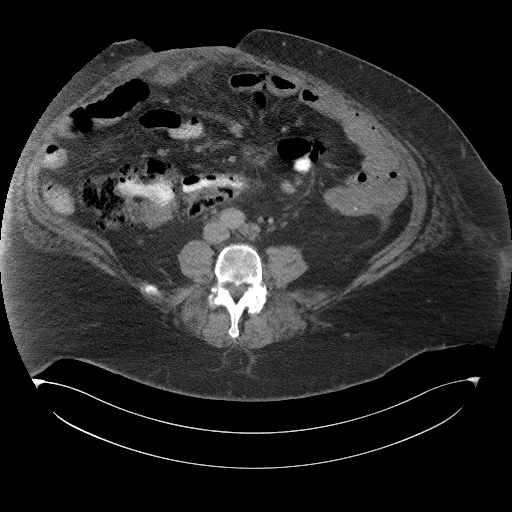
[im 54/101  soft-tissue]
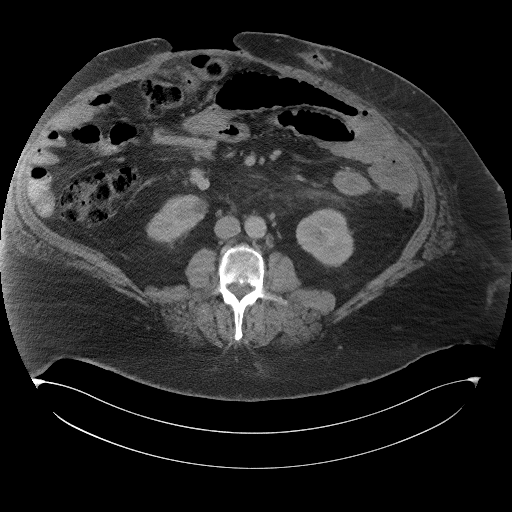
[im 61/101  soft-tissue]
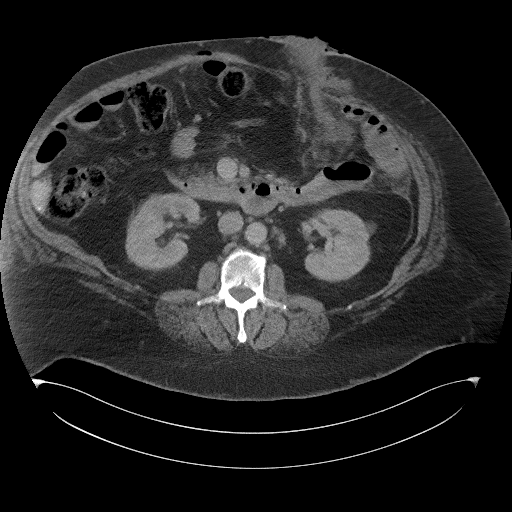
[im 67/101  soft-tissue]
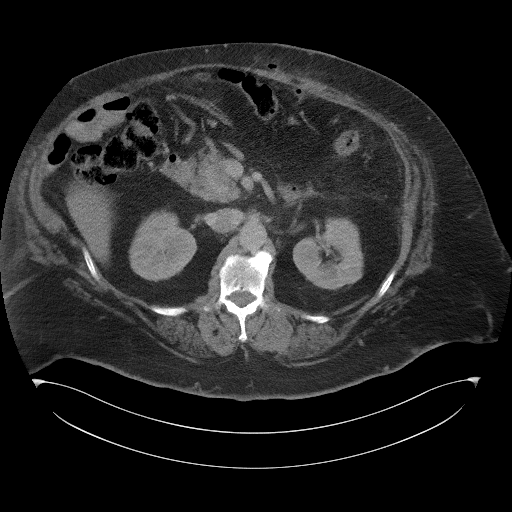
[im 67/101  bone]
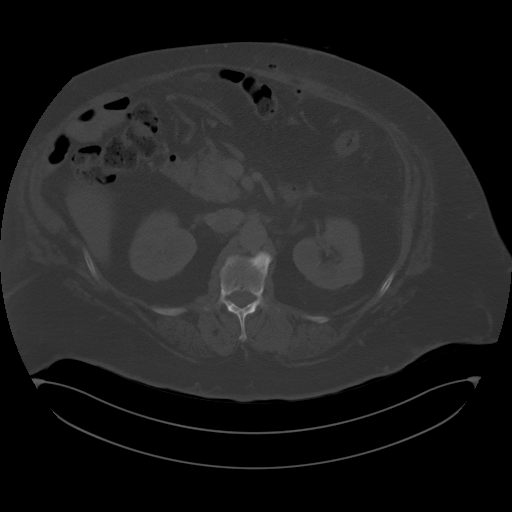
[im 81/101  soft-tissue]
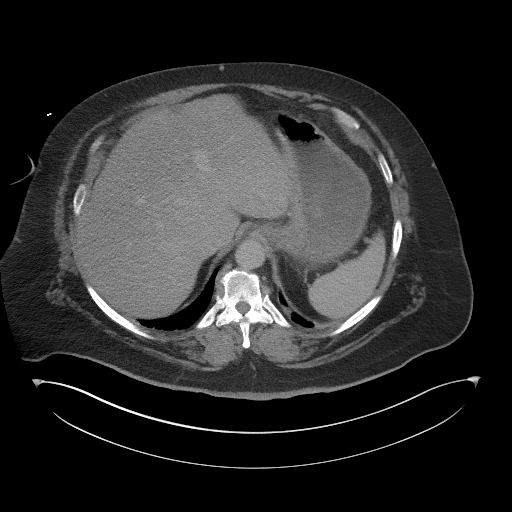
[im 87/101  soft-tissue]
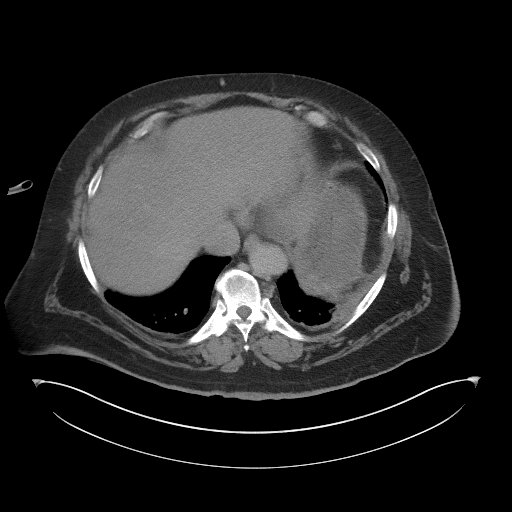
[im 94/101  soft-tissue]
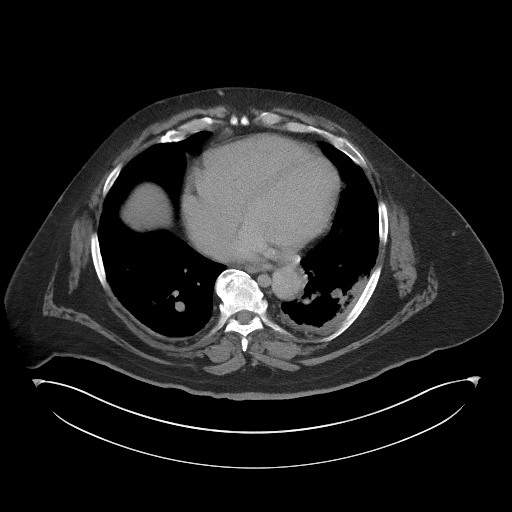

[Series 5: coronal st · coronal · 0.94mm/px · 3 of 126 slices shown]
[im 42/126  soft-tissue]
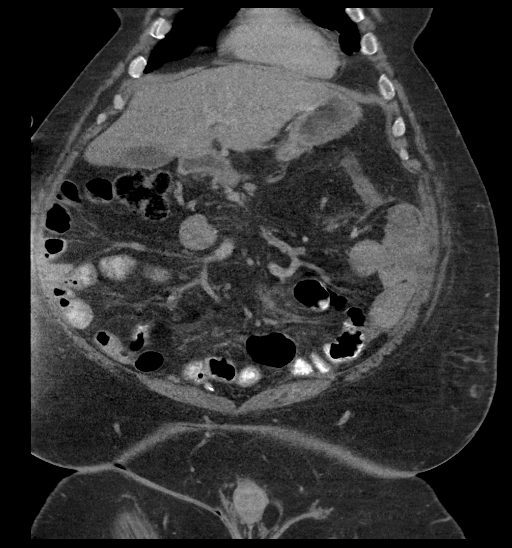
[im 56/126  soft-tissue]
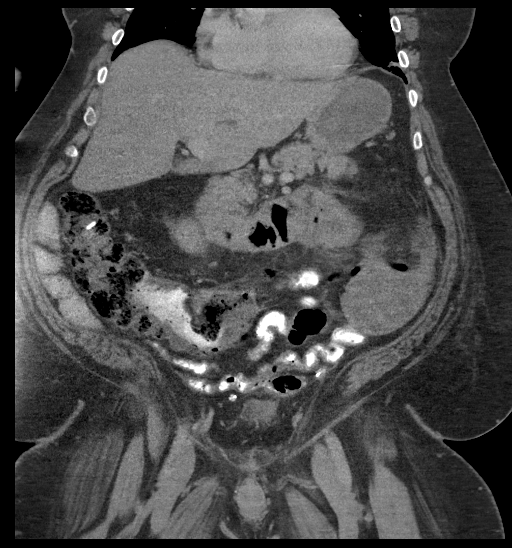
[im 70/126  soft-tissue]
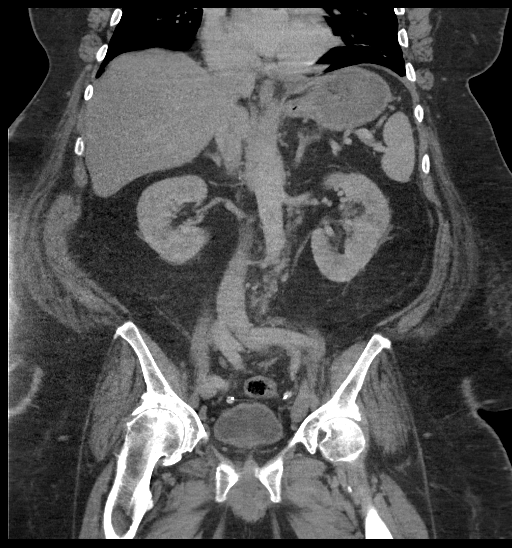

[15 of 46 positions shown; findings below may reference images not displayed]

FINDINGS: Lower chest: Increased peripheral airspace opacity in the left lower
lobe, a component of which is due to atelectasis although
superimposed pneumonia is not excluded especially for example on
image 27 series 4. Bandlike atelectasis noted in the lingula along
with mild peripheral atelectasis in the right lower lobe. Trace
pleural fluid on the left. Mild cardiomegaly.

Hepatobiliary: Unremarkable

Pancreas: Unremarkable

Spleen: Unremarkable

Adrenals/Urinary Tract: 1.8 by 1.6 cm hypodense lesion of the right
kidney lower pole medially on image 47 series 2, likely a cyst
although technically nonspecific. Similar 1.5 by 1.3 cm left mid
kidney lesion anterolaterally on image 40 series 2. Urinary bladder
unremarkable.

Stomach/Bowel: Left-sided colostomy noted with Hartmann's pouch, gas
density associated with the Hartmann's pouch extending beyond the
staple line for example on image 71 of series 2, with a drainage
catheter terminating in the vicinity of this presumed breakdown of
the staple line. Similar to previous the apparent extraluminal gas
in this vicinity has a more left lateral linear component and a
localized rounded component further medially as shown on images
63-72 of series 2. The more lateral gas collection terminates in a
small collection of complex fluid with enhancing margin in the left
paracolic gutter. There continue to be a couple of foci of
extraluminal gas in the subcutaneous tissues along the margin of the
colostomy, and also intra-abdominally near the ostomy site for
example on images 34-40 of series 2.

No dilated bowel is identified.

Vascular/Lymphatic: 1.0 cm left periaortic lymph node and small
additional retroperitoneal lymph nodes are likely reactive.

Reproductive: Unremarkable

Other: The midline anterior abdominal wall wound is healing by
secondary intention. There is substantial separation of the
subcutaneous tissues in the area of the wound, by about 11.2 cm.
Low-level edema is present in the omentum underlying the anterior
abdominal wall in the region of the wound.

Musculoskeletal: Prominent right and moderate left degenerative hip
arthropathy. Bridging spurring of the sacroiliac joints. Lower
lumbar spondylosis and degenerative disc disease causing bilateral
foraminal impingement at L4-5 and L5-S1.
IMPRESSION: 1. The area of breakdown along the stapled margin of the Hartmann's
pouch appears similar, includes a linear gas collection extending
from the staple line towards the left paracolic gutter where there
is a small complex fluid collection with enhancing margins that may
represent abscess. There is also a more medial gas collection along
the mesentery near the breakdown site which is similar to prior.
2. There continue to be a fused tiny locules of gas in the
subcutaneous tissues and along the peritoneal margin near the ostomy
site. Likewise similar appearance of wound healing by secondary
intention, with a wide gap between the subcutaneous tissues and with
edema in the omentum deep to the wound site.
3. Increased peripheral airspace opacity in the left lower lobe some
of which is from atelectasis but a component of pneumonia cannot be
excluded. Trace left pleural effusion.
4. Prominent right and moderate left degenerative hip arthropathy.
5. Foraminal impingement at L4-5 and L5-S1 bilaterally.

## 2024-10-08 ENCOUNTER — Emergency Department (HOSPITAL_BASED_OUTPATIENT_CLINIC_OR_DEPARTMENT_OTHER)

## 2024-10-08 ENCOUNTER — Emergency Department (HOSPITAL_BASED_OUTPATIENT_CLINIC_OR_DEPARTMENT_OTHER)
Admission: EM | Admit: 2024-10-08 | Discharge: 2024-10-08 | Disposition: A | Discharge status: Home / Self Care | Attending: Emergency Medicine | Admitting: Emergency Medicine

## 2024-10-08 ENCOUNTER — Encounter (HOSPITAL_BASED_OUTPATIENT_CLINIC_OR_DEPARTMENT_OTHER): Payer: Self-pay

## 2024-10-08 ENCOUNTER — Other Ambulatory Visit: Payer: Self-pay

## 2024-10-08 DIAGNOSIS — R079 Chest pain, unspecified: Secondary | ICD-10-CM | POA: Diagnosis not present

## 2024-10-08 DIAGNOSIS — R0602 Shortness of breath: Secondary | ICD-10-CM | POA: Diagnosis present

## 2024-10-08 LAB — BASIC METABOLIC PANEL WITH GFR
Anion gap: 11 (ref 5–15)
BUN: 9 mg/dL (ref 8–23)
CO2: 23 mmol/L (ref 22–32)
Calcium: 8.3 mg/dL — ABNORMAL LOW (ref 8.9–10.3)
Chloride: 106 mmol/L (ref 98–111)
Creatinine, Ser: 0.95 mg/dL (ref 0.61–1.24)
GFR, Estimated: >60 mL/min (ref >60)
Glucose, Bld: 109 mg/dL — ABNORMAL HIGH (ref 70–99)
Potassium: 3.8 mmol/L (ref 3.5–5.1)
Sodium: 141 mmol/L (ref 135–145)

## 2024-10-08 LAB — CBC
HCT: 42 % (ref 39.0–52.0)
Hemoglobin: 14.1 g/dL (ref 13.0–17.0)
MCH: 28.8 pg (ref 26.0–34.0)
MCHC: 33.6 g/dL (ref 30.0–36.0)
MCV: 85.9 fL (ref 80.0–100.0)
Platelets: 181 K/uL (ref 150–400)
RBC: 4.89 MIL/uL (ref 4.22–5.81)
RDW: 15.4 % (ref 11.5–15.5)
WBC: 9.7 K/uL (ref 4.0–10.5)
nRBC: 0 % (ref 0.0–0.2)

## 2024-10-08 LAB — TROPONIN T, HIGH SENSITIVITY
Troponin T High Sensitivity: <15 ng/L (ref 0–19)
Troponin T High Sensitivity: <15 ng/L (ref 0–19)

## 2024-10-08 LAB — HEPATIC FUNCTION PANEL
ALT: 17 U/L (ref 0–44)
AST: 24 U/L (ref 15–41)
Albumin: 2.3 g/dL — ABNORMAL LOW (ref 3.5–5.0)
Alkaline Phosphatase: 52 U/L (ref 38–126)
Bilirubin, Direct: 0.2 mg/dL (ref 0.0–0.2)
Indirect Bilirubin: 0.2 mg/dL — ABNORMAL LOW (ref 0.3–0.9)
Total Bilirubin: 0.4 mg/dL (ref 0.0–1.2)
Total Protein: 5.1 g/dL — ABNORMAL LOW (ref 6.5–8.1)

## 2024-10-08 MED ORDER — IOHEXOL 350 MG/ML SOLN
100.0000 mL | Freq: Once | INTRAVENOUS | Status: AC | PRN
  Administered 2024-10-08: 100 mL via INTRAVENOUS

## 2024-10-08 NOTE — ED Notes (Signed)
 Reviewed discharge instructions and follow up with pt. Pt feeling much better. Advised to follow up with PCP as needed. Pt states understanding. Assisted to vehicle via transport chair. Transported by family

## 2024-10-08 NOTE — ED Notes (Signed)
 Pt complaining of pain with inspiration. Supposed to use CPAP but did not use it last night. Complained of the pain starting while sleeping and lying down in bed. Morbidly obese and OSA.

## 2024-10-08 NOTE — Discharge Instructions (Signed)
 Your laboratories also within normal limits today.  The CT of your chest did not show any pulmonary embolism.  Please follow-up with your primary care physician as needed.  Return to the emergency department if any of your symptoms worsen.

## 2024-10-08 NOTE — ED Notes (Signed)
 Pt ambulated without incident. Pulse ox 93-94% throughout 50 meter walk. Utilized cane as per norm from home. No SOB or worsening symptoms. Pt off O2 throughout and returned to room. HR range from 90-100bpm.

## 2024-10-08 NOTE — ED Triage Notes (Signed)
 Pt ambulatory to triage with cane. Mid chest pain with breathing since during night while in bed. Felt SOB when getting out of bed this morning  Denies cough.Pain right flank area as well Urinary frequency since am. Denies burning Pt has colostomy

## 2024-10-08 NOTE — ED Provider Notes (Signed)
 [facility] Provider Note   CSN: [id number] Arrival date & time: 10/08/24  1019     Patient presents with: Chest Pain   Travis Calderon is a 68 y.o. male.   68 year old male with a past medical history of ostomy, hernia repair, obesity presents to the ED with a chief complaint of sudden onset of right sided chest pressure which began this morning.  Patient reports he was asleep when the pain suddenly woke him up.  He reports he has felt really short of breath since the pain started.  This has been ongoing for approximately 7 hours without any changes.  He has not taken any medication for improvement in symptoms.  Reports that chest pain is worse whenever he tries to take a deep breath.  Did just have a checkup with his PCP on Tuesday at the [facility] and reports there was no new findings.  He denies any history of tobacco use, no history of CAD, no family history of CAD.  No prior history of blood clots.  No fever or cough.  The history is provided by the patient.  Chest Pain Pain location:  R chest Pain quality: aching   Pain radiates to:  Does not radiate Pain severity:  Moderate Onset quality:  Sudden Duration:  7 hours Timing:  Constant Progression:  Unchanged Chronicity:  New Context: breathing   Context: not lifting   Relieved by:  Nothing Worsened by:  Deep breathing Ineffective treatments:  None tried Associated symptoms: shortness of breath   Associated symptoms: no abdominal pain, no back pain, no dizziness, no fever and no lower extremity edema   Risk factors: male sex   Risk factors: no coronary artery disease, no diabetes mellitus, no high cholesterol, no hypertension, no prior DVT/PE and no smoking        Prior to Admission medications   Medication Sig Start Date End Date Taking? Authorizing Provider  acetaminophen  (TYLENOL ) 325 MG tablet Take 2 tablets (650 mg total) by mouth every 6 (six) hours as needed for mild pain  or fever. 07/19/21   Vicci Burnard SAUNDERS, PA-C  albuterol  (PROVENTIL ) (2.5 MG/3ML) 0.083% nebulizer solution Take 3 mLs (2.5 mg total) by nebulization every 6 (six) hours as needed for wheezing. 07/19/21   Vicci Burnard SAUNDERS, PA-C  alum & mag hydroxide-simeth (MAALOX/MYLANTA) 200-200-20 MG/5ML suspension Take 15 mLs by mouth every 4 (four) hours as needed for indigestion or heartburn. 07/19/21   Vicci Burnard SAUNDERS, PA-C  diphenhydrAMINE  (BENADRYL ) 12.5 MG/5ML elixir Take 5-10 mLs (12.5-25 mg total) by mouth every 6 (six) hours as needed for itching, allergies or sleep (anxiety). 07/19/21   Vicci Burnard SAUNDERS, PA-C  famotidine  (PEPCID ) 20 MG tablet Take 1 tablet (20 mg total) by mouth daily. 07/20/21   Vicci Burnard SAUNDERS, PA-C  magnesium  oxide (MAG-OX) 400 (240 Mg) MG tablet Take 1 tablet (400 mg total) by mouth 2 (two) times daily. 07/19/21   Vicci Burnard SAUNDERS, PA-C  melatonin 3 MG TABS tablet Take 1 tablet (3 mg total) by mouth at bedtime. 07/19/21   Johnson, Kelly R, PA-C  potassium chloride  SA (KLOR-CON  M20) 20 MEQ tablet Take 1 tablet (20 mEq total) by mouth 2 (two) times daily. 07/19/21   Vicci Burnard SAUNDERS, PA-C  psyllium (HYDROCIL/METAMUCIL) 95 % PACK Take 1 packet by mouth 2 (two) times daily. 07/19/21   Vicci Burnard SAUNDERS, PA-C    Allergies: Patient has no known allergies.    Review of Systems  Constitutional:  Negative for fever.  HENT:  Negative for sore throat.   Respiratory:  Positive for shortness of breath.   Cardiovascular:  Positive for chest pain.  Gastrointestinal:  Negative for abdominal pain, diarrhea and rectal pain.  Genitourinary:  Negative for flank pain.  Musculoskeletal:  Negative for back pain.  Neurological:  Negative for dizziness.    Updated Vital Signs BP 117/69   Pulse 77   Temp 98.1 F (36.7 C) (Oral)   Resp 20   Wt (!) 154.2 kg   SpO2 92%   BMI 53.25 kg/m   Physical Exam Vitals and nursing note reviewed.  Constitutional:      Appearance: He is well-developed. He is obese.   HENT:     Head: Normocephalic and atraumatic.  Cardiovascular:     Rate and Rhythm: Tachycardia present.  Pulmonary:     Effort: Pulmonary effort is normal. Tachypnea present.     Breath sounds: No decreased breath sounds or wheezing.     Comments: Lungs are difficult to auscultate due to body habitus. Chest:     Chest wall: No tenderness.  Abdominal:     Palpations: Abdomen is soft.     Comments: Ostomy in place with normal output per patient.   Musculoskeletal:     Right lower leg: No edema.     Left lower leg: No edema.     Comments: No bilateral pitting edema, or calf tenderness.  Skin:    General: Skin is warm and dry.  Neurological:     Mental Status: He is alert and oriented to person, place, and time.     (all labs ordered are listed, but only abnormal results are displayed) Labs Reviewed  BASIC METABOLIC PANEL WITH GFR - Abnormal; Notable for the following components:      Result Value   Glucose, Bld 109 (*)    Calcium  8.3 (*)    All other components within normal limits  HEPATIC FUNCTION PANEL - Abnormal; Notable for the following components:   Total Protein 5.1 (*)    Albumin  2.3 (*)    Indirect Bilirubin 0.2 (*)    All other components within normal limits  CBC  TROPONIN T, HIGH SENSITIVITY  TROPONIN T, HIGH SENSITIVITY    EKG: None  Radiology: CT Angio Chest PE W and/or Wo Contrast Result Date: 10/08/2024 CLINICAL DATA:  Concern for pulmonary embolism. Shortness of breath. EXAM: CT ANGIOGRAPHY CHEST WITH CONTRAST TECHNIQUE: Multidetector CT imaging of the chest was performed using the standard protocol during bolus administration of intravenous contrast. Multiplanar CT image reconstructions and MIPs were obtained to evaluate the vascular anatomy. RADIATION DOSE REDUCTION: This exam was performed according to the departmental dose-optimization program which includes automated exposure control, adjustment of the mA and/or kV according to patient size and/or  use of iterative reconstruction technique. CONTRAST:  OMNIPAQUE  IOHEXOL  350 MG/ML SOLN COMPARISON:  Chest radiograph dated 10/08/2024. FINDINGS: Cardiovascular: Mild cardiomegaly. No pericardial effusion. Mildly dilated ascending aorta measures 4.2 cm in diameter. No aortic dissection. Evaluation of the pulmonary arteries is limited due to respiratory motion. No pulmonary artery embolus identified. Mediastinum/Nodes: Top-normal right hilar lymph node measures 9 mm short axis. The esophagus is grossly unremarkable. No mediastinal fluid collection. Asymmetric enlargement of the left thyroid lobe measuring up to 3.6 cm secondary to a nodule. Recommend thyroid US  (ref: J Am Coll Radiol. 2015 Feb;12(2): 143-50). Lungs/Pleura: Bibasilar linear atelectasis/scarring. No focal consolidation, pleural effusion, or pneumothorax. The central airways are patent. Upper  Abdomen: No acute abnormality. Musculoskeletal: Degenerative changes of the spine. No acute osseous pathology. Review of the MIP images confirms the above findings. IMPRESSION: 1. No acute intrathoracic pathology. No CT evidence of pulmonary artery embolus. 2. Mildly dilated ascending aorta measures 4.2 cm in diameter. Recommend annual imaging followup by CTA or MRA. This recommendation follows 2010 ACCF/AHA/AATS/ACR/ASA/SCA/SCAI/SIR/STS/SVM Guidelines for the Diagnosis and Management of Patients with Thoracic Aortic Disease. Circulation. 2010; 121: Z733-z630. Aortic aneurysm NOS (ICD10-I71.9) 3. Left thyroid lobe nodule. Recommend thyroid US . Electronically Signed   By: Vanetta Chou M.D.   On: 10/08/2024 15:39   DG Chest 2 View Result Date: 10/08/2024 EXAM: 2 VIEW(S) XRAY OF THE CHEST 10/08/2024 12:34:13 PM COMPARISON: Portable chest 06/29/2021 and earlier. CLINICAL HISTORY: 68 year old male. Pain with breathing and inspiration. Did not use CPAP last night. Pain started while sleeping. Morbidly obese and OSA. FINDINGS: LUNGS AND PLEURA: Improved lung  volumes. No focal pulmonary opacity. No pulmonary edema. No pleural effusion. No pneumothorax. HEART AND MEDIASTINUM: Stable cardiomegaly. Contour of the thoracic aorta appears stable. BONES AND SOFT TISSUES: No acute osseous abnormality. BOWEL GAS: Paucity of bowel gas. IMPRESSION: 1. No acute cardiopulmonary abnormality. 2. Chronic cardiomegaly, tortuous thoracic aorta. Electronically signed by: Helayne Hurst MD 10/08/2024 12:58 PM EDT RP Workstation: [id number]     Procedures   Medications Ordered in the ED  iohexol  (OMNIPAQUE ) 350 MG/ML injection 100 mL (100 mLs Intravenous Contrast Given 10/08/24 1444)                                    Medical Decision Making Amount and/or Complexity of Data Reviewed Labs: ordered. Radiology: ordered.  Risk Prescription drug management.   This patient presents to the ED for concern of shortness of breath, this involves a number of treatment options, and is a complaint that carries with it a high risk of complications and morbidity.  The differential diagnosis includes volume overload, pneumonia, ACS versus PE.  Co morbidities: Discussed in HPI   Brief History:  See HPI.   EMR reviewed including pt PMHx, past surgical history and past visits to ER.   See HPI for more details  Lab Tests:  I ordered and independently interpreted labs.  The pertinent results include:    I personally reviewed all laboratory work and imaging. Metabolic panel without any acute abnormality specifically kidney function within normal limits and no significant electrolyte abnormalities. CBC without leukocytosis or significant anemia.   Imaging Studies:  Xray of the chest: IMPRESSION:  1. No acute cardiopulmonary abnormality.  2. Chronic cardiomegaly, tortuous thoracic aorta.   CT angio chest showed IMPRESSION:  1. No acute intrathoracic pathology. No CT evidence of pulmonary  artery embolus.  2. Mildly dilated ascending aorta measures 4.2 cm in  diameter.  Recommend annual imaging followup by CTA or MRA. This recommendation  follows 2010 ACCF/AHA/AATS/ACR/ASA/SCA/SCAI/SIR/STS/SVM Guidelines  for the Diagnosis and Management of Patients with Thoracic Aortic  Disease. Circulation. 2010; 121: Z733-z630. Aortic aneurysm NOS  (ICD10-I71.9)  3. Left thyroid lobe nodule. Recommend thyroid US .   Cardiac Monitoring:  The patient was maintained on a cardiac monitor.  I personally viewed and interpreted the cardiac monitored which showed an underlying rhythm of: Sinus Tachycardia EKG non-ischemic  Medicines ordered:  N/A  Consults:  N/A  Reevaluation:  After the interventions noted above I re-evaluated patient and found that they have :improved  Social Determinants of Health:  The patient's social determinants of health were a factor in the care of this patient  Problem List / ED Course:  Patient presented to the ED with a chief complaint of sudden onset of chest pressure which began this morning and woke him up from his sleep.  Described as right sided aching no prior history of this never history of CAD.  Exam is benign.  There is no pain with palpation of his chest.  His lungs are difficult to auscultate due to body habitus.  He is hypoxic on arrival, and oxygen saturations are around 89% with a good waveform. Does not wear oxygen at home at baseline, some concern for pulmonary embolism in the new setting of hypoxia.  He denies any cough, fevers, lungs are difficult to auscultate therefore obtaining chest x-ray to rule out any infectious process. CBC with no leucocytosis, hemoglobin is within normal limits. BMP with no electrolyte abnormality.  Creatinine levels within normal limits.  Hepatic function with normal LFTs.  Troponin x 2 have remained negative. EKG with normal sinus rhythm, no concern for cardiac arrhythmia at this time.  He had oxygen discontinued by me, now has an oxygen saturation of 92%.  He does have sleep apnea but  he does not wear any oxygen at home.  CT angio without any signs of pulmonary embolism.  We discussed ambulation with pulse ox. Patient walked without any oxygen, did maintain his O2 above 90%.  He reports that he does feel better, I do feel that patient is stable for discharge at this time.  He is agreeable to plan and treatment, return precaution discussed at length.  Patient hemodynamically stable for discharge.   Dispostion:  After consideration of the diagnostic results and the patients response to treatment, I feel that the patent would benefit from for close follow up with PCP.     Portions of this note were generated with Scientist, clinical (histocompatibility and immunogenetics). Dictation errors may occur despite best attempts at proofreading.   Final diagnoses:  Shortness of breath    ED Discharge Orders     None          Maureen Broad, PA-C 10/08/24 1755    Rogelia Jerilynn RAMAN, MD 10/18/24 [phone]
